# Patient Record
Sex: Male | Born: 1955 | Race: White | Hispanic: No | Smoking: Former smoker
Health system: Southern US, Community
[De-identification: ages and names within clinical notes are randomized; demographics above are authoritative.]

## PROBLEM LIST (undated history)

## (undated) DIAGNOSIS — M6281 Muscle weakness (generalized): Secondary | ICD-10-CM

## (undated) DIAGNOSIS — S069X9A Unspecified intracranial injury with loss of consciousness of unspecified duration, initial encounter: Secondary | ICD-10-CM

## (undated) DIAGNOSIS — G968 Other specified disorders of central nervous system: Secondary | ICD-10-CM

## (undated) DIAGNOSIS — M109 Gout, unspecified: Secondary | ICD-10-CM

## (undated) DIAGNOSIS — S12000A Unspecified displaced fracture of first cervical vertebra, initial encounter for closed fracture: Secondary | ICD-10-CM

## (undated) DIAGNOSIS — H18609 Keratoconus, unspecified, unspecified eye: Secondary | ICD-10-CM

## (undated) DIAGNOSIS — J189 Pneumonia, unspecified organism: Secondary | ICD-10-CM

## (undated) DIAGNOSIS — I10 Essential (primary) hypertension: Secondary | ICD-10-CM

## (undated) DIAGNOSIS — J690 Pneumonitis due to inhalation of food and vomit: Secondary | ICD-10-CM

## (undated) DIAGNOSIS — G959 Disease of spinal cord, unspecified: Principal | ICD-10-CM

## (undated) DIAGNOSIS — R569 Unspecified convulsions: Secondary | ICD-10-CM

## (undated) DIAGNOSIS — I639 Cerebral infarction, unspecified: Secondary | ICD-10-CM

## (undated) DIAGNOSIS — M1990 Unspecified osteoarthritis, unspecified site: Secondary | ICD-10-CM

## (undated) DIAGNOSIS — M545 Low back pain, unspecified: Secondary | ICD-10-CM

## (undated) DIAGNOSIS — F329 Major depressive disorder, single episode, unspecified: Secondary | ICD-10-CM

## (undated) DIAGNOSIS — G231 Progressive supranuclear ophthalmoplegia [Steele-Richardson-Olszewski]: Secondary | ICD-10-CM

## (undated) DIAGNOSIS — G319 Degenerative disease of nervous system, unspecified: Secondary | ICD-10-CM

## (undated) DIAGNOSIS — G629 Polyneuropathy, unspecified: Secondary | ICD-10-CM

## (undated) DIAGNOSIS — E7800 Pure hypercholesterolemia, unspecified: Secondary | ICD-10-CM

## (undated) DIAGNOSIS — R51 Headache: Secondary | ICD-10-CM

## (undated) DIAGNOSIS — M79605 Pain in left leg: Secondary | ICD-10-CM

## (undated) DIAGNOSIS — T1491XA Suicide attempt, initial encounter: Secondary | ICD-10-CM

## (undated) HISTORY — DX: Disease of spinal cord, unspecified: G95.9

## (undated) HISTORY — DX: Gout, unspecified: M10.9

## (undated) HISTORY — DX: Progressive supranuclear ophthalmoplegia (steele-Richardson-olszewski): G23.1

## (undated) HISTORY — DX: Major depressive disorder, single episode, unspecified: F32.9

## (undated) HISTORY — DX: Keratoconus, unspecified, unspecified eye: H18.609

## (undated) HISTORY — PX: ORIF RADIAL HEAD / NECK FRACTURE: SUR956

## (undated) HISTORY — DX: Unspecified osteoarthritis, unspecified site: M19.90

## (undated) HISTORY — PX: FRACTURE SURGERY: SHX138

## (undated) HISTORY — DX: Low back pain, unspecified: M54.50

## (undated) HISTORY — DX: Unspecified displaced fracture of first cervical vertebra, initial encounter for closed fracture: S12.000A

## (undated) HISTORY — PX: SHOULDER ARTHROSCOPY W/ ROTATOR CUFF REPAIR: SHX2400

## (undated) HISTORY — DX: Muscle weakness (generalized): M62.81

## (undated) HISTORY — DX: Low back pain: M54.5

## (undated) HISTORY — DX: Pain in left leg: M79.605

## (undated) HISTORY — DX: Headache: R51

## (undated) HISTORY — DX: Suicide attempt, initial encounter: T14.91XA

---

## 1994-07-03 DIAGNOSIS — S069XAA Unspecified intracranial injury with loss of consciousness status unknown, initial encounter: Secondary | ICD-10-CM

## 1994-07-03 DIAGNOSIS — S069X9A Unspecified intracranial injury with loss of consciousness of unspecified duration, initial encounter: Secondary | ICD-10-CM

## 1994-07-03 HISTORY — DX: Unspecified intracranial injury with loss of consciousness of unspecified duration, initial encounter: S06.9X9A

## 1994-07-03 HISTORY — DX: Unspecified intracranial injury with loss of consciousness status unknown, initial encounter: S06.9XAA

## 2000-05-18 ENCOUNTER — Encounter: Payer: Self-pay | Admitting: Emergency Medicine

## 2000-05-18 ENCOUNTER — Emergency Department (HOSPITAL_COMMUNITY): Admission: EM | Admit: 2000-05-18 | Discharge: 2000-05-18 | Payer: Self-pay | Admitting: Emergency Medicine

## 2000-07-03 HISTORY — PX: CARDIAC CATHETERIZATION: SHX172

## 2000-08-03 ENCOUNTER — Ambulatory Visit (HOSPITAL_COMMUNITY): Admission: RE | Admit: 2000-08-03 | Discharge: 2000-08-03 | Payer: Self-pay | Admitting: Orthopedic Surgery

## 2000-08-03 ENCOUNTER — Encounter: Payer: Self-pay | Admitting: Orthopedic Surgery

## 2000-08-03 ENCOUNTER — Ambulatory Visit (HOSPITAL_COMMUNITY): Admission: RE | Admit: 2000-08-03 | Discharge: 2000-08-06 | Payer: Self-pay | Admitting: Orthopedic Surgery

## 2000-09-01 ENCOUNTER — Encounter: Payer: Self-pay | Admitting: Neurology

## 2000-09-01 ENCOUNTER — Ambulatory Visit (HOSPITAL_COMMUNITY): Admission: RE | Admit: 2000-09-01 | Discharge: 2000-09-01 | Payer: Self-pay | Admitting: Neurology

## 2000-09-24 ENCOUNTER — Encounter: Payer: Self-pay | Admitting: Emergency Medicine

## 2000-09-24 ENCOUNTER — Inpatient Hospital Stay (HOSPITAL_COMMUNITY): Admission: EM | Admit: 2000-09-24 | Discharge: 2000-09-26 | Payer: Self-pay | Admitting: Emergency Medicine

## 2001-07-03 HISTORY — PX: CORNEAL TRANSPLANT: SHX108

## 2002-02-24 ENCOUNTER — Emergency Department (HOSPITAL_COMMUNITY): Admission: EM | Admit: 2002-02-24 | Discharge: 2002-02-24 | Payer: Self-pay | Admitting: Emergency Medicine

## 2002-04-12 ENCOUNTER — Emergency Department (HOSPITAL_COMMUNITY): Admission: EM | Admit: 2002-04-12 | Discharge: 2002-04-12 | Payer: Self-pay | Admitting: *Deleted

## 2002-12-28 ENCOUNTER — Emergency Department (HOSPITAL_COMMUNITY): Admission: EM | Admit: 2002-12-28 | Discharge: 2002-12-28 | Payer: Self-pay | Admitting: Emergency Medicine

## 2002-12-28 ENCOUNTER — Encounter: Payer: Self-pay | Admitting: Emergency Medicine

## 2003-07-23 ENCOUNTER — Emergency Department (HOSPITAL_COMMUNITY): Admission: EM | Admit: 2003-07-23 | Discharge: 2003-07-23 | Payer: Self-pay | Admitting: Emergency Medicine

## 2005-01-06 ENCOUNTER — Encounter: Admission: RE | Admit: 2005-01-06 | Discharge: 2005-01-06 | Payer: Self-pay | Admitting: Orthopedic Surgery

## 2006-07-03 HISTORY — PX: PEG TUBE PLACEMENT: SUR1034

## 2007-07-02 ENCOUNTER — Encounter: Admission: RE | Admit: 2007-07-02 | Discharge: 2007-07-02 | Payer: Self-pay | Admitting: Neurology

## 2010-07-24 ENCOUNTER — Encounter: Payer: Self-pay | Admitting: Neurology
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
## 2010-12-01 ENCOUNTER — Ambulatory Visit (HOSPITAL_COMMUNITY)
Admission: RE | Admit: 2010-12-01 | Discharge: 2010-12-01 | Disposition: A | Discharge status: Home / Self Care | Payer: Medicaid Other | Source: Ambulatory Visit | Attending: Family | Admitting: Family

## 2010-12-01 ENCOUNTER — Other Ambulatory Visit (HOSPITAL_COMMUNITY): Payer: Self-pay | Admitting: Family

## 2010-12-01 DIAGNOSIS — Z931 Gastrostomy status: Secondary | ICD-10-CM

## 2010-12-01 DIAGNOSIS — Z431 Encounter for attention to gastrostomy: Secondary | ICD-10-CM | POA: Insufficient documentation

## 2010-12-01 MED ORDER — IOHEXOL 300 MG/ML  SOLN
50.0000 mL | Freq: Once | INTRAMUSCULAR | Status: AC | PRN
  Administered 2010-12-01: 10 mL via INTRAVENOUS

## 2011-02-05 ENCOUNTER — Emergency Department (HOSPITAL_COMMUNITY): Payer: Medicare Other

## 2011-02-05 ENCOUNTER — Encounter: Payer: Self-pay | Admitting: Internal Medicine

## 2011-02-05 ENCOUNTER — Inpatient Hospital Stay (HOSPITAL_COMMUNITY)
Admission: EM | Admit: 2011-02-05 | Discharge: 2011-02-08 | DRG: 179 | Disposition: A | Discharge status: Home / Self Care | Payer: Medicare Other | Attending: Internal Medicine | Admitting: Internal Medicine

## 2011-02-05 DIAGNOSIS — M109 Gout, unspecified: Secondary | ICD-10-CM | POA: Diagnosis present

## 2011-02-05 DIAGNOSIS — F329 Major depressive disorder, single episode, unspecified: Secondary | ICD-10-CM | POA: Diagnosis present

## 2011-02-05 DIAGNOSIS — G589 Mononeuropathy, unspecified: Secondary | ICD-10-CM | POA: Diagnosis present

## 2011-02-05 DIAGNOSIS — K5900 Constipation, unspecified: Secondary | ICD-10-CM | POA: Diagnosis present

## 2011-02-05 DIAGNOSIS — G40909 Epilepsy, unspecified, not intractable, without status epilepticus: Secondary | ICD-10-CM | POA: Diagnosis present

## 2011-02-05 DIAGNOSIS — Z87820 Personal history of traumatic brain injury: Secondary | ICD-10-CM

## 2011-02-05 DIAGNOSIS — R259 Unspecified abnormal involuntary movements: Secondary | ICD-10-CM | POA: Diagnosis present

## 2011-02-05 DIAGNOSIS — F3289 Other specified depressive episodes: Secondary | ICD-10-CM | POA: Diagnosis present

## 2011-02-05 DIAGNOSIS — J690 Pneumonitis due to inhalation of food and vomit: Principal | ICD-10-CM | POA: Diagnosis present

## 2011-02-05 DIAGNOSIS — E876 Hypokalemia: Secondary | ICD-10-CM | POA: Diagnosis not present

## 2011-02-05 DIAGNOSIS — K219 Gastro-esophageal reflux disease without esophagitis: Secondary | ICD-10-CM | POA: Diagnosis present

## 2011-02-05 LAB — OCCULT BLOOD, POC DEVICE: Fecal Occult Bld: NEGATIVE

## 2011-02-05 LAB — DIFFERENTIAL
Basophils Relative: 0 % (ref 0–1)
Eosinophils Absolute: 0.5 10*3/uL (ref 0.0–0.7)
Monocytes Relative: 9 % (ref 3–12)

## 2011-02-05 LAB — URINALYSIS, ROUTINE W REFLEX MICROSCOPIC
Bilirubin Urine: NEGATIVE
Glucose, UA: NEGATIVE mg/dL
Hgb urine dipstick: NEGATIVE
Ketones, ur: NEGATIVE mg/dL
Leukocytes, UA: NEGATIVE
Nitrite: NEGATIVE
Protein, ur: NEGATIVE mg/dL
Specific Gravity, Urine: 1.025 (ref 1.005–1.030)
Urobilinogen, UA: 1 mg/dL (ref 0.0–1.0)
pH: 7.5 (ref 5.0–8.0)

## 2011-02-05 LAB — POCT I-STAT, CHEM 8
BUN: 16 mg/dL (ref 6–23)
Chloride: 109 mEq/L (ref 96–112)
Creatinine, Ser: 1 mg/dL (ref 0.50–1.35)
Hemoglobin: 15.3 g/dL (ref 13.0–17.0)
Potassium: 3.6 mEq/L (ref 3.5–5.1)
Sodium: 143 mEq/L (ref 135–145)
TCO2: 25 mmol/L (ref 0–100)

## 2011-02-05 LAB — CBC
HCT: 43.4 % (ref 39.0–52.0)
Hemoglobin: 14.2 g/dL (ref 13.0–17.0)
MCH: 31.2 pg (ref 26.0–34.0)
MCHC: 32.7 g/dL (ref 30.0–36.0)
MCV: 95.4 fL (ref 78.0–100.0)
Platelets: 126 K/uL — ABNORMAL LOW (ref 150–400)
RBC: 4.55 MIL/uL (ref 4.22–5.81)
RDW: 12.9 % (ref 11.5–15.5)
WBC: 9.4 K/uL (ref 4.0–10.5)

## 2011-02-05 LAB — COMPREHENSIVE METABOLIC PANEL
Alkaline Phosphatase: 88 U/L (ref 39–117)
CO2: 27 mEq/L (ref 19–32)
GFR calc non Af Amer: >60 mL/min (ref >60)
Glucose, Bld: 94 mg/dL (ref 70–99)
Potassium: 3.5 mEq/L (ref 3.5–5.1)
Sodium: 141 mEq/L (ref 135–145)
Total Protein: 7.2 g/dL (ref 6.0–8.3)

## 2011-02-05 LAB — CK TOTAL AND CKMB (NOT AT ARMC)
CK, MB: 4.4 ng/mL — ABNORMAL HIGH (ref 0.3–4.0)
Total CK: 118 U/L (ref 7–232)

## 2011-02-05 LAB — TROPONIN I: Troponin I: <0.3 ng/mL (ref <0.30)

## 2011-02-05 MED ORDER — IOHEXOL 300 MG/ML  SOLN
100.0000 mL | Freq: Once | INTRAMUSCULAR | Status: AC | PRN
  Administered 2011-02-05: 100 mL via INTRAVENOUS
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
## 2011-02-06 DIAGNOSIS — M545 Low back pain: Secondary | ICD-10-CM

## 2011-02-06 DIAGNOSIS — J690 Pneumonitis due to inhalation of food and vomit: Secondary | ICD-10-CM

## 2011-02-06 LAB — LIPID PANEL
Cholesterol: 93 mg/dL (ref 0–200)
Total CHOL/HDL Ratio: 3.4 RATIO
Triglycerides: 87 mg/dL (ref <150)

## 2011-02-06 LAB — EXPECTORATED SPUTUM ASSESSMENT W GRAM STAIN, RFLX TO RESP C

## 2011-02-06 LAB — CARDIAC PANEL(CRET KIN+CKTOT+MB+TROPI)
CK, MB: 3.2 ng/mL (ref 0.3–4.0)
Relative Index: INVALID (ref 0.0–2.5)

## 2011-02-06 LAB — BASIC METABOLIC PANEL
BUN: 13 mg/dL (ref 6–23)
Calcium: 8.2 mg/dL — ABNORMAL LOW (ref 8.4–10.5)
GFR calc Af Amer: >60 mL/min (ref >60)
Glucose, Bld: 92 mg/dL (ref 70–99)
Sodium: 141 mEq/L (ref 135–145)

## 2011-02-06 LAB — URINE CULTURE
Colony Count: NO GROWTH
Culture  Setup Time: 201208051753

## 2011-02-06 LAB — CBC
HCT: 39.4 % (ref 39.0–52.0)
Hemoglobin: 12.8 g/dL — ABNORMAL LOW (ref 13.0–17.0)

## 2011-02-07 LAB — CBC
HCT: 41.9 % (ref 39.0–52.0)
Hemoglobin: 13.7 g/dL (ref 13.0–17.0)
MCV: 95 fL (ref 78.0–100.0)
RBC: 4.41 MIL/uL (ref 4.22–5.81)
WBC: 7.7 10*3/uL (ref 4.0–10.5)

## 2011-02-07 LAB — BASIC METABOLIC PANEL
BUN: 10 mg/dL (ref 6–23)
Chloride: 109 mEq/L (ref 96–112)
GFR calc Af Amer: >60 mL/min (ref >60)
Glucose, Bld: 99 mg/dL (ref 70–99)
Potassium: 3.9 mEq/L (ref 3.5–5.1)
Sodium: 143 mEq/L (ref 135–145)

## 2011-02-12 LAB — CULTURE, BLOOD (ROUTINE X 2)
Culture  Setup Time: 201208060009
Culture  Setup Time: 201208060009
Culture: NO GROWTH
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
## 2011-03-15 ENCOUNTER — Other Ambulatory Visit: Payer: Self-pay | Admitting: Neurology

## 2011-03-15 DIAGNOSIS — R200 Anesthesia of skin: Secondary | ICD-10-CM

## 2011-03-17 ENCOUNTER — Ambulatory Visit
Admission: RE | Admit: 2011-03-17 | Discharge: 2011-03-17 | Disposition: A | Discharge status: Home / Self Care | Payer: Medicare Other | Source: Ambulatory Visit | Attending: Neurology | Admitting: Neurology

## 2011-03-17 DIAGNOSIS — R200 Anesthesia of skin: Secondary | ICD-10-CM

## 2011-05-26 ENCOUNTER — Encounter (HOSPITAL_COMMUNITY): Payer: Self-pay | Admitting: *Deleted

## 2011-05-26 ENCOUNTER — Emergency Department (HOSPITAL_COMMUNITY)
Admission: EM | Admit: 2011-05-26 | Discharge: 2011-05-26 | Disposition: A | Discharge status: Home / Self Care | Payer: Medicare Other | Attending: Emergency Medicine | Admitting: Emergency Medicine

## 2011-05-26 ENCOUNTER — Emergency Department (HOSPITAL_COMMUNITY): Payer: Medicare Other

## 2011-05-26 DIAGNOSIS — Z79899 Other long term (current) drug therapy: Secondary | ICD-10-CM | POA: Insufficient documentation

## 2011-05-26 DIAGNOSIS — Z931 Gastrostomy status: Secondary | ICD-10-CM

## 2011-05-26 DIAGNOSIS — K9423 Gastrostomy malfunction: Secondary | ICD-10-CM | POA: Insufficient documentation

## 2011-05-26 MED ORDER — FENTANYL CITRATE 0.05 MG/ML IJ SOLN
50.0000 ug | Freq: Once | INTRAMUSCULAR | Status: AC
  Administered 2011-05-26: 50 ug via INTRAMUSCULAR
  Filled 2011-05-26: qty 2

## 2011-05-26 NOTE — ED Notes (Signed)
Pt recently was admitted to [facility] d/t recent fall and neck fx-C-collar in place.

## 2011-05-26 NOTE — ED Provider Notes (Signed)
History     CSN: [id number] Arrival date & time: 05/26/2011  5:36 PM   First MD Initiated Contact with Patient 05/26/11 1904      Chief Complaint  Patient presents with  . GI Problem    feeding tube fell out    (Consider location/radiation/quality/duration/timing/severity/associated sxs/prior treatment) HPI Pt's family reports pt's G-tube "fell out" today-pt denies any abd pain. G-tube was placed x 6 months ago. Family reports noticing blood from the insertion site-dark blood noted.   History reviewed. No pertinent past medical history.  Past Surgical History  Procedure Date  . Fracture surgery     No family history on file.  History  Substance Use Topics  . Smoking status: Not on file  . Smokeless tobacco: Not on file  . Alcohol Use: No      Review of Systems  Allergies  Review of patient's allergies indicates no known allergies.  Home Medications   Current Outpatient Rx  Name Route Sig Dispense Refill  . ALBUTEROL SULFATE (2.5 MG/3ML) 0.083% IN NEBU Nebulization Take 2.5 mg by nebulization 2 (two) times daily.      . ALLOPURINOL 300 MG PO TABS Oral Take 300 mg by mouth daily. Via tube     . CARBIDOPA-LEVODOPA 25-250 MG PO TABS Oral Take 1 tablet by mouth 4 (four) times daily. Via tube     . CITALOPRAM HYDROBROMIDE 40 MG PO TABS Oral Take 40 mg by mouth daily. Via tube     . ESOMEPRAZOLE MAGNESIUM 40 MG PO CPDR Oral Take 40 mg by mouth daily before breakfast. Via tube     . GABAPENTIN 300 MG PO CAPS Oral Take 1,500 mg by mouth daily. Via tube     . HYDROCODONE-ACETAMINOPHEN 7.5-500 MG PO TABS Oral Take 1 tablet by mouth every 6 (six) hours as needed. Via tube     . LACOSAMIDE 200 MG PO TABS Oral Take 100 mg by mouth 2 (two) times daily. Viva tube     . TOPIRAMATE 100 MG PO TABS Oral Take 250 mg by mouth 2 (two) times daily. One and one-half tab via tube every morning and one tab every evening     . TRAZODONE HCL 50 MG PO TABS Oral Take 50 mg by mouth at  bedtime. Via tube       BP 114/78  Pulse 78  Temp(Src) 98.8 F (37.1 C) (Oral)  Resp 20  SpO2 100%  Physical Exam  Nursing note and vitals reviewed. Constitutional: He appears well-developed. No distress.  HENT:  Head: Normocephalic and atraumatic.  Eyes: Pupils are equal, round, and reactive to light.  Neck:       C-collar in place  Cardiovascular: Regular rhythm, normal heart sounds and intact distal pulses.   Pulmonary/Chest: Effort normal. No respiratory distress.  Abdominal: Soft. Normal appearance. He exhibits no distension. There is no tenderness. There is no rigidity, no rebound and no guarding. No hernia.    Neurological: He is alert. No cranial nerve deficit.  Skin: Skin is warm and dry. No rash noted.  Psychiatric: He has a normal mood and affect. His behavior is normal.    ED Course  Procedures (including critical care time) Gastric tube replaced.  Xray confirmation of proper placement.  Pt discharged with instructions to follow up with their doctor next week.   Labs Reviewed - No data to display No results found.   Dx: G-tube Replacement    MDM  Nelia Shi, MD 05/27/11 [phone]

## 2011-05-26 NOTE — ED Notes (Signed)
Family states pt was d/c from [facility] today after breaking his neck.  Pt has muscular disease and gets all feedings through g tube.  When pt got home, his feeding tube "broke off".  "there is part of the tube still inside".  Pt gets all meds through tube.

## 2011-05-26 NOTE — ED Notes (Signed)
Pt awaiting replacement g-tube

## 2011-05-26 NOTE — ED Notes (Signed)
Pt's family reports pt's G-tube "fell out" today-pt denies any abd pain.  G-tube was placed x 6 months ago.  Family reports noticing blood from the insertion site-dark blood noted.

## 2011-05-27 ENCOUNTER — Encounter (HOSPITAL_COMMUNITY): Payer: Self-pay | Admitting: *Deleted

## 2011-05-27 ENCOUNTER — Emergency Department (HOSPITAL_COMMUNITY)
Admission: EM | Admit: 2011-05-27 | Discharge: 2011-05-27 | Disposition: A | Discharge status: Home / Self Care | Payer: Medicare Other | Attending: Emergency Medicine | Admitting: Emergency Medicine

## 2011-05-27 ENCOUNTER — Emergency Department (HOSPITAL_COMMUNITY): Payer: Medicare Other

## 2011-05-27 DIAGNOSIS — Z431 Encounter for attention to gastrostomy: Secondary | ICD-10-CM | POA: Insufficient documentation

## 2011-05-27 DIAGNOSIS — R109 Unspecified abdominal pain: Secondary | ICD-10-CM | POA: Insufficient documentation

## 2011-05-27 MED ORDER — HYDROMORPHONE HCL PF 2 MG/ML IJ SOLN
INTRAMUSCULAR | Status: AC
  Administered 2011-05-27: 2 mg
  Filled 2011-05-27: qty 1

## 2011-05-27 MED ORDER — HYDROMORPHONE HCL PF 1 MG/ML IJ SOLN: 1.0000 mg | Freq: Once | INTRAMUSCULAR | Status: DC

## 2011-05-27 NOTE — ED Notes (Signed)
Pt assisted to wheel chair with tech. Mother acknowledged understanding of instructions.

## 2011-05-27 NOTE — ED Notes (Signed)
Pt with some discomfort due to attempts to reinsert feeding tube.

## 2011-05-27 NOTE — ED Provider Notes (Signed)
History     CSN: [id number] Arrival date & time: 05/27/2011 10:14 AM   First MD Initiated Contact with Patient 05/27/11 1035      Chief Complaint  Patient presents with  . Abdominal Pain    no pain, feeding tube come out    (Consider location/radiation/quality/duration/timing/severity/associated sxs/prior treatment) Patient is a 55 y.o. male presenting with abdominal pain. The history is provided by the patient.  Abdominal Pain The primary symptoms of the illness include abdominal pain. Primary symptoms comment: The patient returns for recurrence of PEG tube displacing. No complaint of pain. The current episode started 3 to 5 hours ago. The onset of the illness was sudden.    History reviewed. No pertinent past medical history.  Past Surgical History  Procedure Date  . Fracture surgery     No family history on file.  History  Substance Use Topics  . Smoking status: Not on file  . Smokeless tobacco: Not on file  . Alcohol Use: No      Review of Systems  Constitutional: Negative.   Respiratory: Negative.   Cardiovascular: Negative.   Gastrointestinal: Positive for abdominal pain.       See HPI.    Allergies  Review of patient's allergies indicates no known allergies.  Home Medications   Current Outpatient Rx  Name Route Sig Dispense Refill  . ALBUTEROL SULFATE (2.5 MG/3ML) 0.083% IN NEBU Nebulization Take 2.5 mg by nebulization 2 (two) times daily.      . ALLOPURINOL 300 MG PO TABS Oral Take 300 mg by mouth daily. Via tube     . CARBIDOPA-LEVODOPA 25-250 MG PO TABS Oral Take 1 tablet by mouth 4 (four) times daily. Via tube     . CITALOPRAM HYDROBROMIDE 40 MG PO TABS Oral Take 40 mg by mouth daily. Via tube     . ESOMEPRAZOLE MAGNESIUM 40 MG PO CPDR Oral Take 40 mg by mouth daily before breakfast. Via tube     . GABAPENTIN 300 MG PO CAPS Oral Take 1,500 mg by mouth daily. Via tube     . HYDROCODONE-ACETAMINOPHEN 7.5-500 MG PO TABS Oral Take 1 tablet by mouth  every 6 (six) hours as needed. Via tube     . LACOSAMIDE 200 MG PO TABS Oral Take 100 mg by mouth 2 (two) times daily. Viva tube     . TOPIRAMATE 100 MG PO TABS Oral Take 250 mg by mouth 2 (two) times daily. One and one-half tab via tube every morning and one tab every evening     . TRAZODONE HCL 50 MG PO TABS Oral Take 50 mg by mouth at bedtime. Via tube       BP 119/77  Pulse 81  Temp(Src) 98.4 F (36.9 C) (Oral)  Resp 16  SpO2 98%  Physical Exam  Constitutional: He appears well-developed and well-nourished.  HENT:  Mouth/Throat: Oropharynx is clear and moist.  Neck: Normal range of motion.  Cardiovascular: Normal rate and regular rhythm.   Pulmonary/Chest: Effort normal and breath sounds normal.  Abdominal: Bowel sounds are normal. He exhibits no mass. There is no tenderness.       PEG tube stoma unremarkable, without bleeding. Nontender to immediate area nor to abdomen generally.  Musculoskeletal: Normal range of motion.  Neurological: He is alert.  Skin: Skin is warm and dry.  Psychiatric: He has a normal mood and affect.    ED Course  Procedures (including critical care time)  Labs Reviewed - No data to display  Dg Abd 1 View  05/26/2011  *RADIOLOGY REPORT*  Clinical Data: G-tube placement; confirm location.  ABDOMEN - 1 VIEW  Comparison: Abdominal radiograph and CT of the abdomen and pelvis performed 02/05/2011  Findings: Injection of 30 mL of Omnipaque contrast into the patient's G-tube demonstrates filling of the stomach and duodenum; the G-tube appears to be at the antrum of the stomach.  The visualized bowel gas pattern is unremarkable.  No free intra- abdominal air is identified, though evaluation for free air is suboptimal on a single supine view.  The visualized lung bases are grossly clear.  No acute osseous abnormalities are identified.  IMPRESSION: G-tube noted at the antrum of the stomach; injected contrast passes into the duodenum and proximal jejunum.  Original  Report Authenticated By: Tonia Ghent, M.D.     No diagnosis found.    MDM  Patient's original PEG was a 22Fr, which was unable to be re-inserted. 20Fr successfully inserted with minimal discomfort. Post-reinsertion film shows proper placement.        Rodena Medin, PA 05/27/11 1410  Rodena Medin, PA 05/27/11 1410

## 2011-05-27 NOTE — ED Notes (Signed)
Pt here last night for feeding tube reinsertion, tube has come out again this morning

## 2011-05-27 NOTE — ED Provider Notes (Signed)
Medical screening examination/treatment/procedure(s) were performed by non-physician practitioner and as supervising physician I was immediately available for consultation/collaboration.   Lauryn Lizardi, MD 05/27/11 1537 

## 2011-05-27 NOTE — ED Notes (Signed)
New Feeding tube at bedside per MD request.

## 2011-06-23 ENCOUNTER — Emergency Department (HOSPITAL_COMMUNITY): Payer: Medicare Other

## 2011-06-23 ENCOUNTER — Encounter (HOSPITAL_COMMUNITY): Payer: Self-pay | Admitting: Emergency Medicine

## 2011-06-23 ENCOUNTER — Emergency Department (HOSPITAL_COMMUNITY)
Admission: EM | Admit: 2011-06-23 | Discharge: 2011-06-23 | Disposition: A | Discharge status: Other Acute Inpatient Hospital | Payer: Medicare Other | Attending: Emergency Medicine | Admitting: Emergency Medicine

## 2011-06-23 DIAGNOSIS — H11419 Vascular abnormalities of conjunctiva, unspecified eye: Secondary | ICD-10-CM | POA: Insufficient documentation

## 2011-06-23 DIAGNOSIS — E119 Type 2 diabetes mellitus without complications: Secondary | ICD-10-CM | POA: Insufficient documentation

## 2011-06-23 DIAGNOSIS — R202 Paresthesia of skin: Secondary | ICD-10-CM

## 2011-06-23 DIAGNOSIS — T819XXA Unspecified complication of procedure, initial encounter: Secondary | ICD-10-CM

## 2011-06-23 DIAGNOSIS — R200 Anesthesia of skin: Secondary | ICD-10-CM

## 2011-06-23 DIAGNOSIS — Y831 Surgical operation with implant of artificial internal device as the cause of abnormal reaction of the patient, or of later complication, without mention of misadventure at the time of the procedure: Secondary | ICD-10-CM | POA: Insufficient documentation

## 2011-06-23 DIAGNOSIS — Z79899 Other long term (current) drug therapy: Secondary | ICD-10-CM | POA: Insufficient documentation

## 2011-06-23 DIAGNOSIS — W050XXA Fall from non-moving wheelchair, initial encounter: Secondary | ICD-10-CM

## 2011-06-23 DIAGNOSIS — IMO0002 Reserved for concepts with insufficient information to code with codable children: Secondary | ICD-10-CM | POA: Insufficient documentation

## 2011-06-23 DIAGNOSIS — E7800 Pure hypercholesterolemia, unspecified: Secondary | ICD-10-CM | POA: Insufficient documentation

## 2011-06-23 DIAGNOSIS — R569 Unspecified convulsions: Secondary | ICD-10-CM | POA: Insufficient documentation

## 2011-06-23 DIAGNOSIS — R209 Unspecified disturbances of skin sensation: Secondary | ICD-10-CM | POA: Insufficient documentation

## 2011-06-23 DIAGNOSIS — G9589 Other specified diseases of spinal cord: Secondary | ICD-10-CM | POA: Insufficient documentation

## 2011-06-23 DIAGNOSIS — M549 Dorsalgia, unspecified: Secondary | ICD-10-CM | POA: Insufficient documentation

## 2011-06-23 DIAGNOSIS — I10 Essential (primary) hypertension: Secondary | ICD-10-CM | POA: Insufficient documentation

## 2011-06-23 DIAGNOSIS — M542 Cervicalgia: Secondary | ICD-10-CM | POA: Insufficient documentation

## 2011-06-23 HISTORY — DX: Pure hypercholesterolemia, unspecified: E78.00

## 2011-06-23 HISTORY — DX: Degenerative disease of nervous system, unspecified: G31.9

## 2011-06-23 HISTORY — DX: Unspecified convulsions: R56.9

## 2011-06-23 HISTORY — DX: Essential (primary) hypertension: I10

## 2011-06-23 HISTORY — DX: Other specified disorders of central nervous system: G96.8

## 2011-06-23 MED ORDER — FENTANYL CITRATE 0.05 MG/ML IJ SOLN
25.0000 ug | Freq: Once | INTRAMUSCULAR | Status: AC
  Administered 2011-06-23: 25 ug via INTRAVENOUS
  Filled 2011-06-23: qty 2

## 2011-06-23 MED ORDER — FENTANYL CITRATE 0.05 MG/ML IJ SOLN
50.0000 ug | Freq: Once | INTRAMUSCULAR | Status: AC
  Administered 2011-06-23: 50 ug via INTRAVENOUS
  Filled 2011-06-23: qty 2
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
## 2011-06-23 NOTE — ED Notes (Signed)
Pt returned from CT. Assisted pt with urinal. of dark yellow urine noted. Pt in no distress at this time. AWaiting ct results

## 2011-06-23 NOTE — ED Notes (Signed)
Called resp to assess pt

## 2011-06-23 NOTE — ED Notes (Signed)
No change in pt condition. Resting quietly in bed. Mother at bedside. No distress noted at this time.

## 2011-06-23 NOTE — ED Notes (Signed)
Pt from home c/o falling out of wheel chair this am; pt was able to get back into chair and notify family; pt with hx of C2 fracture 1 month ago that was repaired at [facility]; pt sts was wearing brace when fell; pt c/o bilateral numbness and tingling worse in arms but in all extremities; pt mae; pt with hx of degenerative brain stem disease and can not speak but pt is alert and oriented

## 2011-06-23 NOTE — ED Notes (Signed)
Patients low blood pressure reported to rn/jessica.

## 2011-10-20 ENCOUNTER — Other Ambulatory Visit: Payer: Self-pay | Admitting: Neurology

## 2011-10-20 DIAGNOSIS — M5412 Radiculopathy, cervical region: Secondary | ICD-10-CM

## 2011-10-26 ENCOUNTER — Ambulatory Visit
Admission: RE | Admit: 2011-10-26 | Discharge: 2011-10-26 | Disposition: A | Discharge status: Home / Self Care | Payer: Medicare Other | Source: Ambulatory Visit | Attending: Neurology | Admitting: Neurology

## 2011-10-26 DIAGNOSIS — M5412 Radiculopathy, cervical region: Secondary | ICD-10-CM

## 2012-01-09 ENCOUNTER — Other Ambulatory Visit: Payer: Self-pay | Admitting: Neurosurgery

## 2012-01-09 DIAGNOSIS — R202 Paresthesia of skin: Secondary | ICD-10-CM

## 2012-01-15 ENCOUNTER — Ambulatory Visit
Admission: RE | Admit: 2012-01-15 | Discharge: 2012-01-15 | Disposition: A | Discharge status: Home / Self Care | Payer: Medicare Other | Source: Ambulatory Visit | Attending: Neurosurgery | Admitting: Neurosurgery

## 2012-01-15 DIAGNOSIS — R202 Paresthesia of skin: Secondary | ICD-10-CM

## 2012-01-22 ENCOUNTER — Other Ambulatory Visit: Payer: Self-pay | Admitting: Neurology

## 2012-01-22 DIAGNOSIS — R1310 Dysphagia, unspecified: Secondary | ICD-10-CM

## 2012-01-23 ENCOUNTER — Emergency Department (HOSPITAL_COMMUNITY)
Admission: EM | Admit: 2012-01-23 | Discharge: 2012-01-23 | Disposition: A | Discharge status: Home / Self Care | Payer: Medicare Other | Attending: Emergency Medicine | Admitting: Emergency Medicine

## 2012-01-23 ENCOUNTER — Emergency Department (HOSPITAL_COMMUNITY): Payer: Medicare Other

## 2012-01-23 ENCOUNTER — Encounter (HOSPITAL_COMMUNITY): Payer: Self-pay | Admitting: *Deleted

## 2012-01-23 ENCOUNTER — Other Ambulatory Visit: Payer: Self-pay | Admitting: Neurology

## 2012-01-23 ENCOUNTER — Encounter: Payer: Self-pay | Admitting: Neurology

## 2012-01-23 DIAGNOSIS — E119 Type 2 diabetes mellitus without complications: Secondary | ICD-10-CM | POA: Insufficient documentation

## 2012-01-23 DIAGNOSIS — M109 Gout, unspecified: Secondary | ICD-10-CM | POA: Insufficient documentation

## 2012-01-23 DIAGNOSIS — T85528A Displacement of other gastrointestinal prosthetic devices, implants and grafts, initial encounter: Secondary | ICD-10-CM

## 2012-01-23 DIAGNOSIS — M129 Arthropathy, unspecified: Secondary | ICD-10-CM | POA: Insufficient documentation

## 2012-01-23 DIAGNOSIS — I10 Essential (primary) hypertension: Secondary | ICD-10-CM | POA: Insufficient documentation

## 2012-01-23 DIAGNOSIS — Z87891 Personal history of nicotine dependence: Secondary | ICD-10-CM | POA: Insufficient documentation

## 2012-01-23 DIAGNOSIS — K9423 Gastrostomy malfunction: Secondary | ICD-10-CM

## 2012-01-23 DIAGNOSIS — K9420 Gastrostomy complication, unspecified: Secondary | ICD-10-CM | POA: Insufficient documentation

## 2012-01-23 DIAGNOSIS — E7800 Pure hypercholesterolemia, unspecified: Secondary | ICD-10-CM | POA: Insufficient documentation

## 2012-01-23 HISTORY — DX: Progressive supranuclear ophthalmoplegia (steele-Richardson-olszewski): G23.1

## 2012-01-23 MED ORDER — IOHEXOL 300 MG/ML  SOLN
50.0000 mL | Freq: Once | INTRAMUSCULAR | Status: AC | PRN
  Administered 2012-01-23: 50 mL

## 2012-01-23 NOTE — Progress Notes (Signed)
pcp is Fayrene Fearing love per pt Updated EPIC

## 2012-01-23 NOTE — ED Notes (Signed)
Peg site cleaned w/ NS and drsg applied, PEG tube placed at bedside.

## 2012-01-23 NOTE — ED Notes (Signed)
ZOX:[id number]<AV> Expected date:01/23/12<BR> Expected time: 2:44 PM<BR> Means of arrival:Ambulance<BR> Comments:<BR> Peg tube came out

## 2012-01-23 NOTE — ED Provider Notes (Signed)
History    56yM presenting after after PEG tube came out. Has at bedside. Balloon broken. Happened about 3 hours prior to arrival. Mild abdominal discomfort. No n/v. No CP. No other complaints.   CSN: [id number]  Arrival date & time 01/23/12  1453   First MD Initiated Contact with Patient 01/23/12 1513      Chief Complaint  Patient presents with  . Abdominal Pain    PEG tube came out    (Consider location/radiation/quality/duration/timing/severity/associated sxs/prior treatment) HPI  Past Medical History  Diagnosis Date  . Diabetes mellitus   . Hypertension   . Seizure   . Hypercholesterolemia   . CNS degenerative disease   . Headache   . Arthritis   . Gout   . Low back pain radiating to left leg   . Keratoconus   . Supranuclear palsy     Past Surgical History  Procedure Date  . Fracture surgery   . Peg tube placement 2008  . Left shoulder surgery 2002  . Corneal implant 2003    History reviewed. No pertinent family history.  History  Substance Use Topics  . Smoking status: Former Games developer  . Smokeless tobacco: Not on file  . Alcohol Use: No      Review of Systems   Review of symptoms negative unless otherwise noted in HPI.   Allergies  Review of patient's allergies indicates no known allergies.  Home Medications   Current Outpatient Rx  Name Route Sig Dispense Refill  . ALBUTEROL SULFATE (2.5 MG/3ML) 0.083% IN NEBU Nebulization Take 2.5 mg by nebulization 2 (two) times daily.     . ALLOPURINOL 300 MG PO TABS Tube Give 300 mg by tube daily. Via tube    . CARBIDOPA-LEVODOPA 25-250 MG PO TABS Tube Give 1 tablet by tube 4 (four) times daily. Via tube    . VITAMIN D 1000 UNITS PO TABS Tube Give 1,000 Units by tube 2 (two) times daily.     Marland Kitchen CITALOPRAM HYDROBROMIDE 40 MG PO TABS Tube Give 40 mg by tube daily. Via tube    . ESOMEPRAZOLE MAGNESIUM 40 MG PO CPDR Tube Give 40 mg by tube daily before breakfast. Via tube    . GABAPENTIN 300 MG PO CAPS Tube  Give 1,500 mg by tube daily. Via tube    . HYDROCODONE-ACETAMINOPHEN 7.5-500 MG PO TABS Tube Give 1 tablet by tube every 6 (six) hours as needed. For pain    . LACOSAMIDE 200 MG PO TABS Tube Give 100 mg by tube 2 (two) times daily. Viva tube    . TOPIRAMATE 100 MG PO TABS Tube Give 250 mg by tube 2 (two) times daily. One and one-half tab via tube every morning and one tab every evening    . TRAZODONE HCL 50 MG PO TABS Tube Give 50 mg by tube at bedtime. Via tube      BP 106/72  Pulse 59  Temp 98.1 F (36.7 C) (Oral)  Resp 14  SpO2 100%  Physical Exam  Nursing note and vitals reviewed. Constitutional: No distress.  HENT:  Head: Atraumatic.  Eyes: Conjunctivae are normal. Right eye exhibits no discharge. Left eye exhibits no discharge.  Neck: Neck supple.  Cardiovascular: Normal rate, regular rhythm and normal heart sounds.  Exam reveals no gallop and no friction rub.   No murmur heard. Pulmonary/Chest: Effort normal and breath sounds normal. No respiratory distress.  Abdominal: Soft. He exhibits no distension. There is no tenderness.  Gastrostomy site LUQ. Healthy appearing granulation tissue around tract. No bleeding.  Musculoskeletal: He exhibits no edema and no tenderness.  Neurological: He is alert.  Skin: Skin is warm and dry.    ED Course  Gastrostomy tube replacement Date/Time: 01/23/2012 4:06 PM Performed by: Raeford Razor Authorized by: Raeford Razor Consent: Verbal consent obtained. Consent given by: patient Patient identity confirmed: verbally with patient and arm band Preparation: Patient was prepped and draped in the usual sterile fashion. Patient sedated: no Patient tolerance: Patient tolerated the procedure well with no immediate complications. Comments: 39F gastrostomy tube placed. Placement confirmed with with XR.   (including critical care time)  Labs Reviewed - No data to display Dg Abd 1 View  01/23/2012  *RADIOLOGY REPORT*  Clinical Data: Peg  tube placement.  ABDOMEN - 1 VIEW  Comparison: 01/19/2012.  Findings:  50 ml of Omnipaque-300 was instilled by the staff.   Peg tube tip is in the gastric body.  No obvious leakage of contrast.  Radiopaque content of the descending colon/sigmoid colon may be related to ingested material.  The possibility of free intraperitoneal air cannot be addressed on a supine view.  IMPRESSION: Peg tube tip is in the gastric body.  Original Report Authenticated By: Fuller Canada, M.D.     1. Dislodged gastrostomy tube   2. Gastrostomy tube dysfunction       MDM  56yM with dislodged gastrostomy tube. Replaced without difficulty. XR reviewed and appears in place. FU with interventional radiology as previously scheduled.        Raeford Razor, MD 01/23/12 1640

## 2012-01-23 NOTE — ED Notes (Signed)
New dressing applied to PEG tube opening.
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
## 2012-01-23 NOTE — ED Notes (Signed)
Per EMS pt from home, PEG tube came out, scheduled tomorrow for permanent tube placement, drainage coming out from where PEG tube was, complaining of abdominal pain 6/10. Denies n/v.

## 2012-01-23 NOTE — ED Notes (Signed)
Pt states he lives at home with a roommate who helps take care of him, states was getting his medication this morning and his PEG tube fell out, dressing over PEG tube site, yellow drainage visible. Pt states having abdominal pain 6/10. Denies n/v/d. Pt states had to get the PEG tube for medications and nutrition. States is suppose to get a permanent one tomorrow.

## 2012-01-24 ENCOUNTER — Ambulatory Visit (HOSPITAL_COMMUNITY)
Admission: RE | Admit: 2012-01-24 | Discharge: 2012-01-24 | Disposition: A | Discharge status: Home / Self Care | Payer: Medicare Other | Source: Ambulatory Visit | Attending: Neurology | Admitting: Neurology

## 2012-01-24 DIAGNOSIS — R1310 Dysphagia, unspecified: Secondary | ICD-10-CM

## 2012-08-03 HISTORY — PX: CHOLECYSTECTOMY: SHX55

## 2012-09-30 ENCOUNTER — Emergency Department (HOSPITAL_COMMUNITY)
Admission: EM | Admit: 2012-09-30 | Discharge: 2012-10-01 | Disposition: A | Discharge status: Home / Self Care | Payer: Medicare Other | Attending: Emergency Medicine | Admitting: Emergency Medicine

## 2012-09-30 ENCOUNTER — Encounter (HOSPITAL_COMMUNITY): Payer: Self-pay | Admitting: Emergency Medicine

## 2012-09-30 DIAGNOSIS — R5383 Other fatigue: Secondary | ICD-10-CM | POA: Insufficient documentation

## 2012-09-30 DIAGNOSIS — R531 Weakness: Secondary | ICD-10-CM

## 2012-09-30 DIAGNOSIS — G40909 Epilepsy, unspecified, not intractable, without status epilepticus: Secondary | ICD-10-CM | POA: Insufficient documentation

## 2012-09-30 DIAGNOSIS — Z87891 Personal history of nicotine dependence: Secondary | ICD-10-CM | POA: Insufficient documentation

## 2012-09-30 DIAGNOSIS — R5381 Other malaise: Secondary | ICD-10-CM | POA: Insufficient documentation

## 2012-09-30 DIAGNOSIS — I10 Essential (primary) hypertension: Secondary | ICD-10-CM | POA: Insufficient documentation

## 2012-09-30 DIAGNOSIS — E7800 Pure hypercholesterolemia, unspecified: Secondary | ICD-10-CM | POA: Insufficient documentation

## 2012-09-30 DIAGNOSIS — Z8669 Personal history of other diseases of the nervous system and sense organs: Secondary | ICD-10-CM | POA: Insufficient documentation

## 2012-09-30 DIAGNOSIS — Z8739 Personal history of other diseases of the musculoskeletal system and connective tissue: Secondary | ICD-10-CM | POA: Insufficient documentation

## 2012-09-30 DIAGNOSIS — E119 Type 2 diabetes mellitus without complications: Secondary | ICD-10-CM | POA: Insufficient documentation

## 2012-09-30 DIAGNOSIS — Z79899 Other long term (current) drug therapy: Secondary | ICD-10-CM | POA: Insufficient documentation

## 2012-09-30 DIAGNOSIS — M109 Gout, unspecified: Secondary | ICD-10-CM | POA: Insufficient documentation

## 2012-09-30 NOTE — ED Notes (Signed)
WUJ:[id number]<JY> Expected date:09/30/12<BR> Expected time:10:51 PM<BR> Means of arrival:Ambulance<BR> Comments:<BR> 57 yo M

## 2012-09-30 NOTE — ED Notes (Signed)
Pt is aware of the need for urine sample. States he will let me know when he can void.

## 2012-09-30 NOTE — ED Notes (Signed)
Per EMS, pt has been more weak than normal, pt usually able to stand to transfer to chair, unable to at this time.  Pt began shaking and almost fell upon trying to stand.  Pt nonverbal but able to communicate that his head hurts and has been hurting for the past two days.

## 2012-10-01 ENCOUNTER — Emergency Department (HOSPITAL_COMMUNITY): Payer: Medicare Other

## 2012-10-01 LAB — CBC
Hemoglobin: 14.9 g/dL (ref 13.0–17.0)
MCH: 30.2 pg (ref 26.0–34.0)
MCV: 94.1 fL (ref 78.0–100.0)
Platelets: 146 10*3/uL — ABNORMAL LOW (ref 150–400)
RBC: 4.94 MIL/uL (ref 4.22–5.81)

## 2012-10-01 LAB — URINALYSIS, ROUTINE W REFLEX MICROSCOPIC
Bilirubin Urine: NEGATIVE
Hgb urine dipstick: NEGATIVE
Ketones, ur: NEGATIVE mg/dL
Specific Gravity, Urine: 1.016 (ref 1.005–1.030)
Urobilinogen, UA: 0.2 mg/dL (ref 0.0–1.0)

## 2012-10-01 LAB — BASIC METABOLIC PANEL
CO2: 28 mEq/L (ref 19–32)
Calcium: 9.4 mg/dL (ref 8.4–10.5)
Creatinine, Ser: 0.47 mg/dL — ABNORMAL LOW (ref 0.50–1.35)
Glucose, Bld: 78 mg/dL (ref 70–99)

## 2012-10-01 MED ORDER — HYDROCODONE-ACETAMINOPHEN 7.5-325 MG/15ML PO SOLN
15.0000 mL | Freq: Once | ORAL | Status: AC
  Administered 2012-10-01: 15 mL
  Filled 2012-10-01: qty 15

## 2012-10-01 NOTE — ED Provider Notes (Signed)
History     CSN: [id number]  Arrival date & time 09/30/12  2314   First MD Initiated Contact with Patient 09/30/12 2335      Chief Complaint  Patient presents with  . Weakness     HPI The patient is a 57 year old patient with a history of super nuclear palsy and chronic dysphasia for which is fed through a G-tube constantly.  Caregiver believes that the patient is slightly more weak today than baseline and nearly fell when trying to get up and move.  No nausea vomiting or diarrhea.  No fevers or chills.  No recent shortness of breath but does report cough.  The patient's had a history of pneumonia and aspiration pneumonia before in the past.  No recent changes in medications.  No confusion or altered mental status per caregiver.  Patient also has a history of seizures, diabetes, hypertension, headache, keratoconus  Past Medical History  Diagnosis Date  . Diabetes mellitus   . Hypertension   . Seizure   . Hypercholesterolemia   . CNS degenerative disease   . Headache   . Arthritis   . Gout   . Low back pain radiating to left leg   . Keratoconus   . Supranuclear palsy     Past Surgical History  Procedure Laterality Date  . Fracture surgery    . Peg tube placement  2008  . Left shoulder surgery  2002  . Corneal implant  2003    History reviewed. No pertinent family history.  History  Substance Use Topics  . Smoking status: Former Games developer  . Smokeless tobacco: Not on file  . Alcohol Use: No      Review of Systems  All other systems reviewed and are negative.    Allergies  Review of patient's allergies indicates no known allergies.  Home Medications   Current Outpatient Rx  Name  Route  Sig  Dispense  Refill  . albuterol (PROVENTIL) (2.5 MG/3ML) 0.083% nebulizer solution   Nebulization   Take 2.5 mg by nebulization 2 (two) times daily.          Marland Kitchen allopurinol (ZYLOPRIM) 300 MG tablet   Tube   Give 300 mg by tube daily. Via tube         .  carbidopa-levodopa (SINEMET) 25-250 MG per tablet   Tube   Give 1 tablet by tube 4 (four) times daily. Via tube         . cholecalciferol (VITAMIN D) 1000 UNITS tablet   Tube   Give 1,000 Units by tube 2 (two) times daily.          . citalopram (CELEXA) 40 MG tablet   Tube   Give 40 mg by tube daily. Via tube         . esomeprazole (NEXIUM) 40 MG capsule   Tube   Give 40 mg by tube daily before breakfast. Via tube         . gabapentin (NEURONTIN) 300 MG capsule   Tube   Give 1,500 mg by tube daily. Via tube         . HYDROcodone-acetaminophen (LORTAB) 7.5-500 MG per tablet   Tube   Give 1 tablet by tube every 6 (six) hours as needed. For pain         . lacosamide (VIMPAT) 200 MG TABS   Tube   Give 100 mg by tube 2 (two) times daily. Viva tube         . topiramate (  TOPAMAX) 100 MG tablet   Tube   Give 250 mg by tube 2 (two) times daily. One and one-half tab via tube every morning and one tab every evening         . traZODone (DESYREL) 50 MG tablet   Tube   Give 50 mg by tube at bedtime. Via tube           BP 108/78  Pulse 76  Temp(Src) 98.8 F (37.1 C) (Oral)  Resp 18  SpO2 98%  Physical Exam  Nursing note and vitals reviewed. Constitutional: He appears well-developed and well-nourished.  HENT:  Head: Normocephalic and atraumatic.  Eyes: EOM are normal.  Neck: Normal range of motion.  Cardiovascular: Normal rate, regular rhythm, normal heart sounds and intact distal pulses.   Pulmonary/Chest: Effort normal and breath sounds normal. No respiratory distress.  Abdominal: Soft. He exhibits no distension. There is no tenderness.  PEG tube in place without secondary signs of surrounding cellulitis  Musculoskeletal: Normal range of motion.  Neurological: He is alert.  Grossly moves his arms and legs at baseline per family  Skin: Skin is warm and dry.  Psychiatric: He has a normal mood and affect. Judgment normal.    ED Course  Procedures  (including critical care time)  Labs Reviewed  CBC - Abnormal; Notable for the following:    WBC 10.6 (*)    Platelets 146 (*)    All other components within normal limits  BASIC METABOLIC PANEL - Abnormal; Notable for the following:    Creatinine, Ser 0.47 (*)    All other components within normal limits  URINALYSIS, ROUTINE W REFLEX MICROSCOPIC   Dg Chest 2 View  10/01/2012  *RADIOLOGY REPORT*  Clinical Data: Weakness.  CHEST - 2 VIEW  Comparison: 08/29/2012  Findings: Shallow inspiration with linear atelectasis in the right lung base.  Normal heart size and pulmonary vascularity.  No definite consolidation or airspace disease.  No pneumothorax.  No blunting of costophrenic angles.  Degenerative changes in the thoracolumbar spine with bridging osteophytes suggesting ankylosis. Postoperative changes in the left shoulder.  Old right rib fractures.  IMPRESSION: Shallow inspiration with probable linear atelectasis in the right lung base.  Ankylosis of the spine.   Original Report Authenticated By: Burman Nieves, M.D.    Ct Head Wo Contrast  10/01/2012  *RADIOLOGY REPORT*  Clinical Data: Headache and increasing weakness for 2 days.  CT HEAD WITHOUT CONTRAST  Technique:  Contiguous axial images were obtained from the base of the skull through the vertex without contrast.  Comparison: 06/23/2011  Findings: Diffuse cerebral atrophy.  No ventricular dilatation. Focal low attenuation in the right thalamus suggesting old lacunar infarct, stable since previous study.  No mass effect or midline shift.  No abnormal extra-axial fluid collections.  Gray-white matter junctions are distinct.  Basal cisterns are not effaced.  No evidence of acute intracranial hemorrhage.  No depressed skull fractures.  Visualized paranasal sinuses and mastoid air cells are not opacified.  Postoperative changes at C1 and C2.  Tortuous basilar artery.  Vascular calcifications.  IMPRESSION: No acute intracranial abnormalities.  Mild  chronic atrophy.  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   Original Report Authenticated By: Burman Nieves, M.D.    I personally reviewed the imaging tests through PACS system I reviewed available ER/hospitalization records through the EMR    No diagnosis found.    MDM    Is a difficult case as the patient has a severe neurologic problem which  is his super nuclear palsy.  No obvious cause her symptoms are found today.  His labs and urine looked normal.  CT scan shows nothing acute.  The patient and the PCP followup in the next 24-48 hours.        Lyanne Co, MD 10/01/12 [phone]

## 2012-10-01 NOTE — ED Notes (Signed)
Pt is aware of the need for urine, however states he is still unable at this time. Urinal at bedside.

## 2012-10-09 ENCOUNTER — Encounter: Payer: Self-pay | Admitting: Neurology

## 2012-10-09 ENCOUNTER — Ambulatory Visit (INDEPENDENT_AMBULATORY_CARE_PROVIDER_SITE_OTHER): Payer: Medicare Other | Admitting: Neurology

## 2012-10-09 VITALS — BP 118/70 | HR 76 | Temp 96.9°F | Ht 67.0 in

## 2012-10-09 DIAGNOSIS — F322 Major depressive disorder, single episode, severe without psychotic features: Secondary | ICD-10-CM | POA: Insufficient documentation

## 2012-10-09 DIAGNOSIS — G959 Disease of spinal cord, unspecified: Secondary | ICD-10-CM

## 2012-10-09 DIAGNOSIS — F32A Depression, unspecified: Secondary | ICD-10-CM | POA: Insufficient documentation

## 2012-10-09 DIAGNOSIS — IMO0002 Reserved for concepts with insufficient information to code with codable children: Secondary | ICD-10-CM

## 2012-10-09 DIAGNOSIS — F329 Major depressive disorder, single episode, unspecified: Secondary | ICD-10-CM

## 2012-10-09 DIAGNOSIS — M4712 Other spondylosis with myelopathy, cervical region: Secondary | ICD-10-CM

## 2012-10-09 DIAGNOSIS — S12000A Unspecified displaced fracture of first cervical vertebra, initial encounter for closed fracture: Secondary | ICD-10-CM | POA: Insufficient documentation

## 2012-10-09 DIAGNOSIS — S12000S Unspecified displaced fracture of first cervical vertebra, sequela: Secondary | ICD-10-CM

## 2012-10-09 DIAGNOSIS — F3289 Other specified depressive episodes: Secondary | ICD-10-CM

## 2012-10-09 HISTORY — DX: Disease of spinal cord, unspecified: G95.9

## 2012-10-09 HISTORY — DX: Depression, unspecified: F32.A

## 2012-10-09 HISTORY — DX: Unspecified displaced fracture of first cervical vertebra, initial encounter for closed fracture: S12.000A

## 2012-10-09 NOTE — Patient Instructions (Addendum)
I think overall you are doing fairly well and are stable at this point.  I do have some generic suggestions for you today:  Do not attempt to eat by mouth, you are at risk for aspiration and pneumonia which you have had before. I do not mind you sucking on ice occasionally but would not recommend that you eat or drink anything beyond that. I do not think we need to make any changes in your medications at this point. I think you're stable enough that I can see you back in 6 months, sooner if we need to. Please call us if you have any interim questions, concerns, or problems or updates to need to discuss.  Brett Canales is my clinical assistant and will answer any of your questions and relay your messages to me and will give you my messages.   Our phone number is [phone]. We also have an after hours call service for urgent matters and there is a physician on-call for urgent questions. For any emergencies you know to call 911 or go to the nearest emergency room.
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
## 2012-10-18 ENCOUNTER — Encounter (HOSPITAL_COMMUNITY): Payer: Self-pay | Admitting: Emergency Medicine

## 2012-10-18 ENCOUNTER — Emergency Department (HOSPITAL_COMMUNITY)
Admission: EM | Admit: 2012-10-18 | Discharge: 2012-10-18 | Disposition: A | Discharge status: Home / Self Care | Payer: Medicare Other | Attending: Emergency Medicine | Admitting: Emergency Medicine

## 2012-10-18 ENCOUNTER — Emergency Department (HOSPITAL_COMMUNITY): Payer: Medicare Other

## 2012-10-18 DIAGNOSIS — F329 Major depressive disorder, single episode, unspecified: Secondary | ICD-10-CM | POA: Insufficient documentation

## 2012-10-18 DIAGNOSIS — M129 Arthropathy, unspecified: Secondary | ICD-10-CM | POA: Insufficient documentation

## 2012-10-18 DIAGNOSIS — F3289 Other specified depressive episodes: Secondary | ICD-10-CM | POA: Insufficient documentation

## 2012-10-18 DIAGNOSIS — W010XXA Fall on same level from slipping, tripping and stumbling without subsequent striking against object, initial encounter: Secondary | ICD-10-CM | POA: Insufficient documentation

## 2012-10-18 DIAGNOSIS — Z8669 Personal history of other diseases of the nervous system and sense organs: Secondary | ICD-10-CM | POA: Insufficient documentation

## 2012-10-18 DIAGNOSIS — I10 Essential (primary) hypertension: Secondary | ICD-10-CM | POA: Insufficient documentation

## 2012-10-18 DIAGNOSIS — Y833 Surgical operation with formation of external stoma as the cause of abnormal reaction of the patient, or of later complication, without mention of misadventure at the time of the procedure: Secondary | ICD-10-CM | POA: Insufficient documentation

## 2012-10-18 DIAGNOSIS — Y92009 Unspecified place in unspecified non-institutional (private) residence as the place of occurrence of the external cause: Secondary | ICD-10-CM | POA: Insufficient documentation

## 2012-10-18 DIAGNOSIS — Y9301 Activity, walking, marching and hiking: Secondary | ICD-10-CM | POA: Insufficient documentation

## 2012-10-18 DIAGNOSIS — M109 Gout, unspecified: Secondary | ICD-10-CM | POA: Insufficient documentation

## 2012-10-18 DIAGNOSIS — K9429 Other complications of gastrostomy: Secondary | ICD-10-CM | POA: Insufficient documentation

## 2012-10-18 DIAGNOSIS — Z79899 Other long term (current) drug therapy: Secondary | ICD-10-CM | POA: Insufficient documentation

## 2012-10-18 DIAGNOSIS — Z8781 Personal history of (healed) traumatic fracture: Secondary | ICD-10-CM | POA: Insufficient documentation

## 2012-10-18 DIAGNOSIS — E7800 Pure hypercholesterolemia, unspecified: Secondary | ICD-10-CM | POA: Insufficient documentation

## 2012-10-18 DIAGNOSIS — Z431 Encounter for attention to gastrostomy: Secondary | ICD-10-CM

## 2012-10-18 DIAGNOSIS — G40909 Epilepsy, unspecified, not intractable, without status epilepticus: Secondary | ICD-10-CM | POA: Insufficient documentation

## 2012-10-18 DIAGNOSIS — E119 Type 2 diabetes mellitus without complications: Secondary | ICD-10-CM | POA: Insufficient documentation

## 2012-10-18 DIAGNOSIS — Z8739 Personal history of other diseases of the musculoskeletal system and connective tissue: Secondary | ICD-10-CM | POA: Insufficient documentation

## 2012-10-18 DIAGNOSIS — Z87891 Personal history of nicotine dependence: Secondary | ICD-10-CM | POA: Insufficient documentation

## 2012-10-18 MED ORDER — IOHEXOL 300 MG/ML  SOLN
50.0000 mL | Freq: Once | INTRAMUSCULAR | Status: AC | PRN
  Administered 2012-10-18: 30 mL

## 2012-10-18 NOTE — ED Provider Notes (Signed)
History     CSN: [id number]  Arrival date & time 10/18/12  4098   First MD Initiated Contact with Patient 10/18/12 [phone]      Chief Complaint  Patient presents with  . Fall    (Consider location/radiation/quality/duration/timing/severity/associated sxs/prior treatment) Patient is a 57 y.o. male presenting with fall. The history is provided by the patient and a parent.  Fall   patient here after having his PEG tube dislodged after falling. Patient tripped and did not strike his head. No loss of consciousness. Slight back discomfort but otherwise has no complaints of. History of muscle degenerative disease. PEG tube fell out approximately 1 hour prior to arrival. Denies any hip or neck or head pain. Symptoms have been persistent and nothing makes them better worse. No treatment used prior to arrival.  Past Medical History  Diagnosis Date  . Diabetes mellitus   . Hypertension   . Seizure   . Hypercholesterolemia   . CNS degenerative disease   . Headache   . Arthritis   . Gout   . Low back pain radiating to left leg   . Keratoconus   . Supranuclear palsy   . Cervical myelopathy 10/09/2012  . C1 cervical fracture 10/09/2012  . Depression 10/09/2012    Past Surgical History  Procedure Laterality Date  . Fracture surgery    . Peg tube placement  2008  . Left shoulder surgery  2002  . Corneal implant  2003  . Cholecystectomy N/A February, 2014    No family history on file.  History  Substance Use Topics  . Smoking status: Former Games developer  . Smokeless tobacco: Not on file  . Alcohol Use: No      Review of Systems  All other systems reviewed and are negative.    Allergies  Review of patient's allergies indicates no known allergies.  Home Medications   Current Outpatient Rx  Name  Route  Sig  Dispense  Refill  . albuterol (PROVENTIL) (2.5 MG/3ML) 0.083% nebulizer solution   Nebulization   Take 2.5 mg by nebulization 2 (two) times daily.          Marland Kitchen allopurinol  (ZYLOPRIM) 300 MG tablet   Tube   Give 300 mg by tube daily. Via tube         . carbidopa-levodopa (SINEMET) 25-250 MG per tablet   Tube   Give 1 tablet by tube 4 (four) times daily. Via tube         . cholecalciferol (VITAMIN D) 1000 UNITS tablet   Tube   Give 1,000 Units by tube 2 (two) times daily.          . citalopram (CELEXA) 40 MG tablet   Tube   Give 40 mg by tube daily. Via tube         . esomeprazole (NEXIUM) 40 MG capsule   Tube   Give 40 mg by tube daily before breakfast. Via tube         . gabapentin (NEURONTIN) 300 MG capsule   Tube   Give 600-900 mg by tube 2 (two) times daily. 3 caps in am, 2 caps in pm.         . HYDROcodone-acetaminophen (LORTAB) 7.5-500 MG per tablet   Tube   Give 1 tablet by tube every 6 (six) hours as needed. For pain         . lacosamide (VIMPAT) 200 MG TABS   Tube   Give 100 mg by tube 2 (two)  times daily. Viva tube         . topiramate (TOPAMAX) 100 MG tablet   Tube   Give 100-150 mg by tube 2 (two) times daily. 1.5 tabs in am, 1 tab in pm.         . traZODone (DESYREL) 50 MG tablet   Tube   Give 50 mg by tube at bedtime. Via tube         . venlafaxine XR (EFFEXOR-XR) 37.5 MG 24 hr capsule   Tube   Give 37.5 mg by tube daily.           BP 109/69  Pulse 87  Temp(Src) 97.9 F (36.6 C) (Oral)  Resp 18  SpO2 99%  Physical Exam  Nursing note and vitals reviewed. Constitutional: He appears well-developed and well-nourished.  Non-toxic appearance. No distress.  HENT:  Head: Normocephalic and atraumatic.  Eyes: Conjunctivae, EOM and lids are normal. Pupils are equal, round, and reactive to light.  Neck: Normal range of motion. Neck supple. No tracheal deviation present. No mass present.  Cardiovascular: Normal rate, regular rhythm and normal heart sounds.  Exam reveals no gallop.   No murmur heard. Pulmonary/Chest: Effort normal and breath sounds normal. No stridor. No respiratory distress. He has no  decreased breath sounds. He has no wheezes. He has no rhonchi. He has no rales.  Abdominal: Soft. Normal appearance and bowel sounds are normal. He exhibits no distension. There is no tenderness. There is no rebound and no CVA tenderness.  Gastrostomy site noted with slight erythema around the lesion. No active bleeding noted. Location is the patient's left upper quadrant  Musculoskeletal: Normal range of motion. He exhibits no edema and no tenderness.  Neurological: He is alert. No cranial nerve deficit or sensory deficit. GCS eye subscore is 4. GCS verbal subscore is 5. GCS motor subscore is 6.  Patient is a neurological baseline according to his mother  Skin: Skin is warm and dry. No abrasion and no rash noted.  Psychiatric: His affect is blunt.    ED Course  Gastrostomy tube replacement Date/Time: 10/18/2012 9:34 AM Performed by: Toy Baker Authorized by: Lorre Nick T Consent: Verbal consent obtained. Risks and benefits: risks, benefits and alternatives were discussed Consent given by: parent Patient identity confirmed: verbally with patient Time out: Immediately prior to procedure a "time out" was called to verify the correct patient, procedure, equipment, support staff and site/side marked as required. Local anesthesia used: no Patient sedated: no Patient tolerance: Patient tolerated the procedure well with no immediate complications.   (including critical care time)  Labs Reviewed - No data to display No results found.   No diagnosis found.    MDM  g-tube in good position confirmed by xray, pt stable for d/c        Toy Baker, MD 10/18/12 1100

## 2012-10-18 NOTE — ED Notes (Signed)
Pt began walking with walker and got off balance pt has a home therapist help him and pt lost balance and fell . Scratched back with abrsion to lower back area. No loc, alert per normal family. llq peg tube came disloged with the fall. Red around site with clear drainage.

## 2012-11-06 ENCOUNTER — Ambulatory Visit: Payer: Self-pay | Admitting: Neurology

## 2012-11-14 ENCOUNTER — Other Ambulatory Visit: Payer: Self-pay | Admitting: Neurology

## 2012-11-14 NOTE — Telephone Encounter (Signed)
[facility] patient assigned to Dr Frances Furbish.  Has an appt in Oct.

## 2012-11-25 ENCOUNTER — Other Ambulatory Visit: Payer: Self-pay | Admitting: Neurology

## 2012-11-26 ENCOUNTER — Other Ambulatory Visit: Payer: Self-pay | Admitting: Neurology

## 2012-11-28 ENCOUNTER — Other Ambulatory Visit: Payer: Self-pay | Admitting: Neurology

## 2012-11-29 ENCOUNTER — Other Ambulatory Visit: Payer: Self-pay | Admitting: Neurology

## 2012-12-02 ENCOUNTER — Other Ambulatory Visit: Payer: Self-pay | Admitting: Neurology

## 2012-12-02 NOTE — Telephone Encounter (Signed)
This is a duplicate request.  Pharmacy has sent request roughly 7 times.  I have called them multiple times and they say the patient has picked up Rx.  They say they keep deleting the request and do not know why we keep getting it.

## 2012-12-03 ENCOUNTER — Other Ambulatory Visit: Payer: Self-pay | Admitting: Neurology

## 2013-01-17 ENCOUNTER — Other Ambulatory Visit: Payer: Self-pay | Admitting: Neurology

## 2013-01-20 ENCOUNTER — Other Ambulatory Visit: Payer: Self-pay

## 2013-01-20 MED ORDER — ALLOPURINOL 300 MG PO TABS: 300.0000 mg | ORAL_TABLET | Freq: Every day | ORAL | Status: DC

## 2013-01-20 NOTE — Telephone Encounter (Signed)
[facility] patient assigned to Dr Athar.  

## 2013-02-12 ENCOUNTER — Other Ambulatory Visit: Payer: Self-pay | Admitting: Neurology

## 2013-02-19 ENCOUNTER — Other Ambulatory Visit: Payer: Self-pay | Admitting: Neurology

## 2013-02-19 ENCOUNTER — Other Ambulatory Visit: Payer: Self-pay

## 2013-02-19 MED ORDER — VENLAFAXINE HCL ER 37.5 MG PO CP24: 37.5000 mg | ORAL_CAPSULE | Freq: Every day | ORAL | Status: DC

## 2013-02-19 NOTE — Telephone Encounter (Signed)
I have been out of the office since 08/12

## 2013-02-21 ENCOUNTER — Telehealth: Payer: Self-pay | Admitting: *Deleted

## 2013-02-21 NOTE — Telephone Encounter (Signed)
Alexander [name] called from the CAP program and is asking about order for supplies, (diapers and chux).  I called her and relayed did not receive the request that was faxed.  I told her that this should go thru pcp, and gave her the name listed in EPIC Alexander Po, NP) and the phone and fax number.  She would try them.

## 2013-02-22 ENCOUNTER — Other Ambulatory Visit: Payer: Self-pay | Admitting: Neurology

## 2013-02-22 NOTE — Telephone Encounter (Signed)
Duplicate Request  Receipt confirmed by pharmacy (01/20/2013 7:48 AM EDT)

## 2013-03-29 ENCOUNTER — Emergency Department (HOSPITAL_COMMUNITY): Payer: Medicare Other

## 2013-03-29 ENCOUNTER — Inpatient Hospital Stay (HOSPITAL_COMMUNITY)
Admission: EM | Admit: 2013-03-29 | Discharge: 2013-03-31 | DRG: 195 | Disposition: A | Discharge status: Home / Self Care | Payer: Medicare Other | Attending: Internal Medicine | Admitting: Internal Medicine

## 2013-03-29 ENCOUNTER — Encounter (HOSPITAL_COMMUNITY): Payer: Self-pay | Admitting: *Deleted

## 2013-03-29 DIAGNOSIS — Z87891 Personal history of nicotine dependence: Secondary | ICD-10-CM

## 2013-03-29 DIAGNOSIS — G319 Degenerative disease of nervous system, unspecified: Secondary | ICD-10-CM | POA: Diagnosis present

## 2013-03-29 DIAGNOSIS — E7800 Pure hypercholesterolemia, unspecified: Secondary | ICD-10-CM | POA: Diagnosis present

## 2013-03-29 DIAGNOSIS — D72829 Elevated white blood cell count, unspecified: Secondary | ICD-10-CM | POA: Diagnosis present

## 2013-03-29 DIAGNOSIS — J189 Pneumonia, unspecified organism: Principal | ICD-10-CM | POA: Diagnosis present

## 2013-03-29 DIAGNOSIS — R9431 Abnormal electrocardiogram [ECG] [EKG]: Secondary | ICD-10-CM | POA: Diagnosis present

## 2013-03-29 DIAGNOSIS — I10 Essential (primary) hypertension: Secondary | ICD-10-CM | POA: Diagnosis present

## 2013-03-29 DIAGNOSIS — F329 Major depressive disorder, single episode, unspecified: Secondary | ICD-10-CM | POA: Diagnosis present

## 2013-03-29 DIAGNOSIS — E119 Type 2 diabetes mellitus without complications: Secondary | ICD-10-CM | POA: Diagnosis present

## 2013-03-29 DIAGNOSIS — Z931 Gastrostomy status: Secondary | ICD-10-CM

## 2013-03-29 DIAGNOSIS — F3289 Other specified depressive episodes: Secondary | ICD-10-CM | POA: Diagnosis present

## 2013-03-29 MED ORDER — LEVOFLOXACIN IN D5W 750 MG/150ML IV SOLN
750.0000 mg | INTRAVENOUS | Status: DC
  Administered 2013-03-30 – 2013-03-31 (×2): 750 mg via INTRAVENOUS
  Filled 2013-03-29 (×3): qty 150

## 2013-03-29 NOTE — ED Notes (Signed)
Caregiver reports pt has been "acting down, unlike himself the past 4 days." SOB and dizziness started last night.

## 2013-03-29 NOTE — ED Provider Notes (Signed)
CSN: [id number]     Arrival date & time 03/29/13  2215 History   First MD Initiated Contact with Patient 03/29/13 2316     Chief Complaint  Patient presents with  . Shortness of Breath  . Chest Pain   (Consider location/radiation/quality/duration/timing/severity/associated sxs/prior Treatment) HPI Hx per family and caregiver with limited history per PT due to CNS degenerative disease.  He lives at home and not feeling well last few days and worse tonight with cough, SOB and some CP.  He has had yellow productive sputum, no blood and family concerned he has PNA. No leg pain or swelling. Temp was reported 94 yesterday at home.   PCP in [place]  Past Medical History  Diagnosis Date  . Diabetes mellitus   . Hypertension   . Seizure   . Hypercholesterolemia   . CNS degenerative disease   . Headache(784.0)   . Arthritis   . Gout   . Low back pain radiating to left leg   . Keratoconus   . Supranuclear palsy   . Cervical myelopathy 10/09/2012  . C1 cervical fracture 10/09/2012  . Depression 10/09/2012   Past Surgical History  Procedure Laterality Date  . Fracture surgery    . Peg tube placement  2008  . Left shoulder surgery  2002  . Corneal implant  2003  . Cholecystectomy N/A February, 2014   History reviewed. No pertinent family history. History  Substance Use Topics  . Smoking status: Former Games developer  . Smokeless tobacco: Not on file  . Alcohol Use: No    Review of Systems  Constitutional: Negative for chills.  HENT: Negative for neck pain and neck stiffness.   Respiratory: Positive for cough and shortness of breath.   Cardiovascular: Positive for chest pain.  Gastrointestinal: Negative for abdominal pain.  Genitourinary: Negative for flank pain.  Musculoskeletal: Negative for back pain.  Skin: Negative for rash.  Neurological: Negative for headaches.  All other systems reviewed and are negative.    Allergies  Review of patient's allergies indicates no known  allergies.  Home Medications   Current Outpatient Rx  Name  Route  Sig  Dispense  Refill  . allopurinol (ZYLOPRIM) 300 MG tablet   Tube   Give 300 mg by tube every morning.         . cholecalciferol (VITAMIN D) 1000 UNITS tablet   Tube   Give 1,000 Units by tube 2 (two) times daily.          . citalopram (CELEXA) 40 MG tablet   Tube   Give 40 mg by tube daily. Via tube         . cyclobenzaprine (FLEXERIL) 10 MG tablet   Tube   Give 1 tablet by tube 3 (three) times daily as needed for muscle spasms.          Marland Kitchen esomeprazole (NEXIUM) 40 MG capsule   Tube   Give 40 mg by tube daily before breakfast. Via tube         . gabapentin (NEURONTIN) 300 MG capsule   Tube   Give 600-900 mg by tube 2 (two) times daily. Take 3 capsules in the morning and 2 capsules in the evening.         Marland Kitchen HYDROcodone-acetaminophen (LORTAB) 7.5-500 MG per tablet   Tube   Give 1 tablet by tube every 6 (six) hours as needed for pain. For pain         . Lacosamide (VIMPAT) 100 MG TABS  Tube   Give 100 mg by tube 2 (two) times daily.         . meloxicam (MOBIC) 15 MG tablet   Tube   Give 1 tablet by tube every morning.          . nystatin cream (MYCOSTATIN)   Topical   Apply 1 application topically 2 (two) times daily as needed (irritation of incision).          . silver sulfADIAZINE (SILVADENE) 1 % cream   Topical   Apply 1 application topically 2 (two) times daily as needed (irritation of incision).          . topiramate (TOPAMAX) 100 MG tablet   Tube   Give 100-150 mg by tube 2 (two) times daily. Take 1.5 tablets in the morning and 1 tablet in the evening         . traZODone (DESYREL) 50 MG tablet   Tube   Give 50 mg by tube at bedtime. Via tube         . venlafaxine XR (EFFEXOR-XR) 37.5 MG 24 hr capsule   Tube   Give 37.5 mg by tube every morning.           BP 112/69  Pulse 92  Temp(Src) 99 F (37.2 C) (Oral)  Resp 18  SpO2 93% Physical Exam   Constitutional: He appears well-developed and well-nourished.  HENT:  Head: Normocephalic and atraumatic.  Eyes: EOM are normal. Pupils are equal, round, and reactive to light.  Neck: Neck supple.  Cardiovascular: Normal rate, regular rhythm and intact distal pulses.   Pulmonary/Chest: No stridor.  Decreased and course breath sounds bilat. No tachypnea or resp distress  Abdominal: Soft. There is no tenderness.  Musculoskeletal: Normal range of motion. He exhibits no edema.  Neurological:  Awake, alert, follows commands, baseline per family  Skin: Skin is warm and dry.    ED Course  Procedures (including critical care time) Labs Review Labs Reviewed  CBC - Abnormal; Notable for the following:    WBC 12.0 (*)    All other components within normal limits  COMPREHENSIVE METABOLIC PANEL - Abnormal; Notable for the following:    Total Bilirubin 0.2 (*)    All other components within normal limits  TROPONIN I  POCT I-STAT, CHEM 8   Imaging Review Dg Chest 2 View  03/30/2013   CLINICAL DATA:  Short of breath and chest pain for 2 days. Ex-smoker.  EXAM: CHEST  2 VIEW  COMPARISON:  10/01/2012  FINDINGS: The lateral view is degraded by overlying arms. 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  Lower cervical spine fixation. Midline trachea. Borderline cardiomegaly, accentuated by AP portable technique. No definite pleural fluid. Low lung volumes. Patchy bibasilar opacities are greater on the left than right.  IMPRESSION: Low lung volume frontal radiograph with bibasilar pulmonary opacities. Favor atelectasis. If symptoms warrant, consider PA radiographic followup.  Borderline cardiomegaly, without congestive failure.   Electronically Signed   By: Jeronimo Greaves   On: 03/30/2013 00:36     Date: 03/29/2013  Rate: 86  Rhythm: normal sinus rhythm  QRS Axis: normal  Intervals: normal  ST/T Wave abnormalities: nonspecific ST changes  Conduction  Disutrbances:none  Narrative Interpretation: sinus with inf/ lat ST elevations less than 1mm no ST depressions  Old EKG Reviewed: 02-06-11 NSR rate 61 NSST changes  IV ABx, O2 provided for borderline hypoxia  Presentation suggests PNA, abnormal ECG reviewed.  No CP in the ER.  D/w Dr Julian Reil, will admit MDM  DX : CAP, Abnormal ECG CXR, labs, ABx MED admit   Sunnie Nielsen, MD 03/30/13 [phone]

## 2013-03-29 NOTE — ED Notes (Signed)
Patient is alert and oriented to baseline.  His sitter states that he has not been acting right for the last few days. Today the patient woke up and states that he couldn't get his breath.  This evening the patient stated that he felt He couldn't catch his breath and the room was spinning.

## 2013-03-30 DIAGNOSIS — J189 Pneumonia, unspecified organism: Secondary | ICD-10-CM | POA: Diagnosis present

## 2013-03-30 LAB — POCT I-STAT, CHEM 8
BUN: 19 mg/dL (ref 6–23)
Calcium, Ion: 1.22 mmol/L (ref 1.12–1.23)
Creatinine, Ser: 0.8 mg/dL (ref 0.50–1.35)
Hemoglobin: 16 g/dL (ref 13.0–17.0)
Potassium: 3.9 mEq/L (ref 3.5–5.1)
TCO2: 25 mmol/L (ref 0–100)

## 2013-03-30 LAB — TROPONIN I: Troponin I: <0.3 ng/mL (ref <0.30)

## 2013-03-30 LAB — CBC
HCT: 46.3 % (ref 39.0–52.0)
Hemoglobin: 14.4 g/dL (ref 13.0–17.0)
Hemoglobin: 15.2 g/dL (ref 13.0–17.0)
MCH: 31.3 pg (ref 26.0–34.0)
MCHC: 32.8 g/dL (ref 30.0–36.0)
Platelets: 163 10*3/uL (ref 150–400)
RBC: 4.6 MIL/uL (ref 4.22–5.81)
RBC: 4.82 MIL/uL (ref 4.22–5.81)
RDW: 12.8 % (ref 11.5–15.5)
RDW: 12.9 % (ref 11.5–15.5)
WBC: 12 10*3/uL — ABNORMAL HIGH (ref 4.0–10.5)
WBC: 9.8 10*3/uL (ref 4.0–10.5)

## 2013-03-30 LAB — COMPREHENSIVE METABOLIC PANEL
Albumin: 3.8 g/dL (ref 3.5–5.2)
Alkaline Phosphatase: 87 U/L (ref 39–117)
BUN: 18 mg/dL (ref 6–23)
CO2: 25 mEq/L (ref 19–32)
Chloride: 103 mEq/L (ref 96–112)
GFR calc Af Amer: >90 mL/min (ref >90)
GFR calc non Af Amer: >90 mL/min (ref >90)
Glucose, Bld: 91 mg/dL (ref 70–99)
Potassium: 3.9 mEq/L (ref 3.5–5.1)
Total Bilirubin: 0.2 mg/dL — ABNORMAL LOW (ref 0.3–1.2)

## 2013-03-30 LAB — BASIC METABOLIC PANEL
CO2: 25 mEq/L (ref 19–32)
Calcium: 9 mg/dL (ref 8.4–10.5)
Chloride: 104 mEq/L (ref 96–112)
GFR calc Af Amer: >90 mL/min (ref >90)
Potassium: 3.9 mEq/L (ref 3.5–5.1)
Sodium: 138 mEq/L (ref 135–145)

## 2013-03-30 LAB — HIV ANTIBODY (ROUTINE TESTING W REFLEX): HIV: NONREACTIVE

## 2013-03-30 LAB — GLUCOSE, CAPILLARY: Glucose-Capillary: 85 mg/dL (ref 70–99)

## 2013-03-30 LAB — STREP PNEUMONIAE URINARY ANTIGEN: Strep Pneumo Urinary Antigen: NEGATIVE

## 2013-03-30 MED ORDER — TOPIRAMATE 25 MG PO TABS
150.0000 mg | ORAL_TABLET | Freq: Every day | ORAL | Status: DC
  Administered 2013-03-30: 13:00:00 150 mg
  Filled 2013-03-30 (×2): qty 2

## 2013-03-30 MED ORDER — NYSTATIN 100000 UNIT/GM EX CREA
1.0000 "application " | TOPICAL_CREAM | Freq: Two times a day (BID) | CUTANEOUS | Status: DC | PRN
  Filled 2013-03-30: qty 15

## 2013-03-30 MED ORDER — TRAZODONE HCL 50 MG PO TABS
50.0000 mg | ORAL_TABLET | Freq: Every day | ORAL | Status: DC
  Administered 2013-03-30 (×2): 50 mg
  Filled 2013-03-30 (×4): qty 1

## 2013-03-30 MED ORDER — GABAPENTIN 300 MG PO CAPS: 600.0000 mg | ORAL_CAPSULE | Freq: Two times a day (BID) | ORAL | Status: DC

## 2013-03-30 MED ORDER — CYCLOBENZAPRINE HCL 10 MG PO TABS
10.0000 mg | ORAL_TABLET | Freq: Three times a day (TID) | ORAL | Status: DC | PRN
  Filled 2013-03-30: qty 1

## 2013-03-30 MED ORDER — SILVER SULFADIAZINE 1 % EX CREA: 1.0000 "application " | TOPICAL_CREAM | Freq: Two times a day (BID) | CUTANEOUS | Status: DC | PRN

## 2013-03-30 MED ORDER — HEPARIN SODIUM (PORCINE) 5000 UNIT/ML IJ SOLN
5000.0000 [IU] | Freq: Three times a day (TID) | INTRAMUSCULAR | Status: DC
  Administered 2013-03-30 – 2013-03-31 (×4): 5000 [IU] via SUBCUTANEOUS
  Filled 2013-03-30 (×7): qty 1

## 2013-03-30 MED ORDER — MELOXICAM 15 MG PO TABS
15.0000 mg | ORAL_TABLET | Freq: Every morning | ORAL | Status: DC
  Administered 2013-03-30: 15 mg
  Filled 2013-03-30 (×2): qty 1

## 2013-03-30 MED ORDER — GABAPENTIN 250 MG/5ML PO SOLN
900.0000 mg | Freq: Every day | ORAL | Status: DC
Start: 2013-03-30 — End: 2013-03-30

## 2013-03-30 MED ORDER — LACOSAMIDE 50 MG PO TABS: 100.0000 mg | ORAL_TABLET | Freq: Two times a day (BID) | ORAL | Status: DC

## 2013-03-30 MED ORDER — VENLAFAXINE HCL ER 37.5 MG PO CP24
37.5000 mg | ORAL_CAPSULE | Freq: Every morning | ORAL | Status: DC
  Filled 2013-03-30: qty 1

## 2013-03-30 MED ORDER — PANTOPRAZOLE SODIUM 40 MG PO PACK
80.0000 mg | PACK | Freq: Every day | ORAL | Status: DC
  Administered 2013-03-30: 80 mg
  Filled 2013-03-30 (×2): qty 40

## 2013-03-30 MED ORDER — TOPIRAMATE 25 MG PO TABS: 100.0000 mg | ORAL_TABLET | Freq: Two times a day (BID) | ORAL | Status: DC

## 2013-03-30 MED ORDER — ALLOPURINOL 300 MG PO TABS
300.0000 mg | ORAL_TABLET | Freq: Every morning | ORAL | Status: DC
  Administered 2013-03-30: 300 mg
  Filled 2013-03-30 (×2): qty 1

## 2013-03-30 MED ORDER — TOPIRAMATE 100 MG PO TABS
100.0000 mg | ORAL_TABLET | Freq: Every day | ORAL | Status: DC
  Administered 2013-03-30 (×2): 100 mg
  Filled 2013-03-30 (×3): qty 1

## 2013-03-30 MED ORDER — GABAPENTIN 250 MG/5ML PO SOLN
600.0000 mg | Freq: Every day | ORAL | Status: DC
  Administered 2013-03-30 (×2): 600 mg
  Filled 2013-03-30 (×4): qty 12

## 2013-03-30 MED ORDER — VITAMIN D3 25 MCG (1000 UNIT) PO TABS
1000.0000 [IU] | ORAL_TABLET | Freq: Two times a day (BID) | ORAL | Status: DC
  Administered 2013-03-30 (×2): 1000 [IU] via ORAL
  Filled 2013-03-30 (×4): qty 1

## 2013-03-30 MED ORDER — PANTOPRAZOLE SODIUM 40 MG PO TBEC: 80.0000 mg | DELAYED_RELEASE_TABLET | Freq: Every day | ORAL | Status: DC

## 2013-03-30 MED ORDER — SODIUM CHLORIDE 0.9 % IV SOLN
INTRAVENOUS | Status: AC
  Administered 2013-03-30: 13:00:00 via INTRAVENOUS
  Administered 2013-03-31: 1000 mL via INTRAVENOUS

## 2013-03-30 MED ORDER — VENLAFAXINE HCL 37.5 MG PO TABS
18.7500 mg | ORAL_TABLET | Freq: Two times a day (BID) | ORAL | Status: DC
  Administered 2013-03-30: 18.75 mg
  Filled 2013-03-30 (×5): qty 0.5

## 2013-03-30 MED ORDER — CITALOPRAM HYDROBROMIDE 40 MG PO TABS
40.0000 mg | ORAL_TABLET | Freq: Every day | ORAL | Status: DC
  Administered 2013-03-30: 40 mg
  Filled 2013-03-30 (×2): qty 1

## 2013-03-30 MED ORDER — GABAPENTIN 250 MG/5ML PO SOLN
900.0000 mg | Freq: Every day | ORAL | Status: DC
  Administered 2013-03-30: 900 mg
  Filled 2013-03-30 (×2): qty 18

## 2013-03-30 MED ORDER — HYDROCODONE-ACETAMINOPHEN 7.5-325 MG/15ML PO SOLN
15.0000 mL | Freq: Four times a day (QID) | ORAL | Status: DC | PRN
  Administered 2013-03-30 (×2): 15 mL
  Filled 2013-03-30 (×2): qty 15

## 2013-03-30 MED ORDER — LACOSAMIDE 50 MG PO TABS
100.0000 mg | ORAL_TABLET | Freq: Two times a day (BID) | ORAL | Status: DC
  Administered 2013-03-30: 100 mg
  Filled 2013-03-30 (×2): qty 2

## 2013-03-30 NOTE — H&P (Addendum)
[facility] History and Physical  ALASSANE KALAFUT ZOX:[id number] DOB: Jun 12, 1956 DOA: 03/29/2013  Referring physician: ED PCP: Dema Severin, NP   Chief Complaint: SOB, cough  HPI: Alexander [name] is a 57 y.o. male with PMH of CNS degenerative disease, who presents to the ED with his family and caregiver.  They note that patient has had increasing cough, ill appearing, for the past 2-3 days.  Cough was significantly worse tonight with SOB and CP.  Cough productive of yellowish sputum.  Family concerned he may have PNA as he has had PNA several times in past (been about a year since last PNA per family).  In the ED he was noted to have T of 99.8, O2 sats were 93%, WBC 12k, CXR demonstrated bibasilar atelectasis vs infiltrate.  Clinical diagnosis of CAP made, and patient started on levaquin, hospitalist asked to admit.  Review of Systems: Unable to perform due to patients CNS degenerative disease, he is non-verbal.  Past Medical History  Diagnosis Date  . Diabetes mellitus   . Hypertension   . Seizure   . Hypercholesterolemia   . CNS degenerative disease   . Headache(784.0)   . Arthritis   . Gout   . Low back pain radiating to left leg   . Keratoconus   . Supranuclear palsy   . Cervical myelopathy 10/09/2012  . C1 cervical fracture 10/09/2012  . Depression 10/09/2012   Past Surgical History  Procedure Laterality Date  . Fracture surgery    . Peg tube placement  2008  . Left shoulder surgery  2002  . Corneal implant  2003  . Cholecystectomy N/A February, 2014   Social History:  reports that he has quit smoking. He does not have any smokeless tobacco history on file. He reports that he does not drink alcohol or use illicit drugs.   No Known Allergies  History reviewed. No pertinent family history.   Prior to Admission medications   Medication Sig Start Date End Date Taking? Authorizing Provider  allopurinol (ZYLOPRIM) 300 MG tablet Give 300 mg by tube every morning.    Yes Historical Provider, MD  cholecalciferol (VITAMIN D) 1000 UNITS tablet Give 1,000 Units by tube 2 (two) times daily.    Yes Historical Provider, MD  citalopram (CELEXA) 40 MG tablet Give 40 mg by tube daily. Via tube   Yes Historical Provider, MD  cyclobenzaprine (FLEXERIL) 10 MG tablet Give 1 tablet by tube 3 (three) times daily as needed for muscle spasms.  03/07/13  Yes Historical Provider, MD  esomeprazole (NEXIUM) 40 MG capsule Give 40 mg by tube daily before breakfast. Via tube   Yes Historical Provider, MD  gabapentin (NEURONTIN) 300 MG capsule Give 600-900 mg by tube 2 (two) times daily. Take 3 capsules in the morning and 2 capsules in the evening.   Yes Historical Provider, MD  HYDROcodone-acetaminophen (LORTAB) 7.5-500 MG per tablet Give 1 tablet by tube every 6 (six) hours as needed for pain. For pain   Yes Historical Provider, MD  Lacosamide (VIMPAT) 100 MG TABS Give 100 mg by tube 2 (two) times daily.   Yes Historical Provider, MD  meloxicam (MOBIC) 15 MG tablet Give 1 tablet by tube every morning.  03/10/13  Yes Historical Provider, MD  nystatin cream (MYCOSTATIN) Apply 1 application topically 2 (two) times daily as needed (irritation of incision).  03/11/13  Yes Historical Provider, MD  silver sulfADIAZINE (SILVADENE) 1 % cream Apply 1 application topically 2 (two) times daily as needed (  irritation of incision).  03/11/13  Yes Historical Provider, MD  topiramate (TOPAMAX) 100 MG tablet Give 100-150 mg by tube 2 (two) times daily. Take 1.5 tablets in the morning and 1 tablet in the evening   Yes Historical Provider, MD  traZODone (DESYREL) 50 MG tablet Give 50 mg by tube at bedtime. Via tube   Yes Historical Provider, MD  venlafaxine XR (EFFEXOR-XR) 37.5 MG 24 hr capsule Give 37.5 mg by tube every morning.    Yes Historical Provider, MD   Physical Exam: Filed Vitals:   03/30/13 0022  BP:   Pulse:   Temp: 99.8 F (37.7 C)  Resp:     General:  NAD, resting comfortably in bed Eyes:  PEERLA EOMI ENT: mucous membranes moist Neck: supple w/o JVD Cardiovascular: RRR w/o MRG Respiratory: coarse BS bilateral bases, no respiratory distress Abdomen: soft, nt, nd, bs+ Skin: no rash nor lesion Musculoskeletal: MAE, full ROM all 4 extremities Psychiatric: normal tone and affect Neurologic: Awake, alert, follows commands, non-verbal, baseline per family  Labs on Admission:  Basic Metabolic Panel:  Recent Labs Lab 03/29/13 2355 03/30/13 0007  NA 139 142  K 3.9 3.9  CL 103 107  CO2 25  --   GLUCOSE 91 91  BUN 18 19  CREATININE 0.58 0.80  CALCIUM 9.3  --    Liver Function Tests:  Recent Labs Lab 03/29/13 2355  AST 18  ALT 15  ALKPHOS 87  BILITOT 0.2*  PROT 7.1  ALBUMIN 3.8   No results found for this basename: LIPASE, AMYLASE,  in the last 168 hours No results found for this basename: AMMONIA,  in the last 168 hours CBC:  Recent Labs Lab 03/29/13 2355 03/30/13 0007  WBC 12.0*  --   HGB 15.2 16.0  HCT 46.3 47.0  MCV 96.1  --   PLT 168  --    Cardiac Enzymes:  Recent Labs Lab 03/29/13 2355  TROPONINI <0.30    BNP (last 3 results) No results found for this basename: PROBNP,  in the last 8760 hours CBG: No results found for this basename: GLUCAP,  in the last 168 hours  Radiological Exams on Admission: Dg Chest 2 View  03/30/2013   CLINICAL DATA:  Short of breath and chest pain for 2 days. Ex-smoker.  EXAM: CHEST  2 VIEW  COMPARISON:  10/01/2012  FINDINGS: The lateral view is degraded by overlying arms. 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  Lower cervical spine fixation. Midline trachea. Borderline cardiomegaly, accentuated by AP portable technique. No definite pleural fluid. Low lung volumes. Patchy bibasilar opacities are greater on the left than right.  IMPRESSION: Low lung volume frontal radiograph with bibasilar pulmonary opacities. Favor atelectasis. If symptoms warrant, consider PA radiographic  followup.  Borderline cardiomegaly, without congestive failure.   Electronically Signed   By: Jeronimo Greaves   On: 03/30/2013 00:36    EKG: Ordered, but not yet in EPIC.  Assessment/Plan Principal Problem:   CAP (community acquired pneumonia)   1. CAP - Initially some question of atelectasis vs PNA on CXR but clinically this looks more like PNA.  Continuing levaquin started by EDP, on PNA pathway, sputum cultures ordered and pending.  Monitor O2 sats. 2. Neurodegenerative disorder - patient at baseline per family, have ordered to start PEG feeds per home regimin   Code Status: Full (must indicate code status--if unknown or must be presumed, indicate so) Family Communication: Spoke with wife and caregiver at bedside (indicate person  spoken with, if applicable, with phone number if by telephone) Disposition Plan: Admit to inpatient (indicate anticipated LOS)  Time spent: 70 min  Margerite Impastato M. [facility] Pager [phone]  If 7PM-7AM, please contact night-coverage [email or web address] Password [email or web address] 03/30/2013, 1:53 AM

## 2013-03-30 NOTE — Progress Notes (Signed)
Patient expressed to have CP and pain in left shoulder. Patient vs were 118/78, hr 80s. Complete 12 lead ekg and found to be normal sinus rhythm. No other symptoms noted or observed. Patient is having increased cough that is productive. Will continue to monitor. Patient given PRN pain medication.

## 2013-03-30 NOTE — Progress Notes (Signed)
Patient admitted to room 1517 from ED with levaquin infusing. Patient has not  had blood cultures x2  drawn prior to antibiotics. Made Lenny Pastel. N.P. aware. Will get blood cultures this am with morning labs.

## 2013-03-30 NOTE — Progress Notes (Signed)
Notified Dr. Elvera Lennox as pt has not produced but 25ml of urine in his condom cath today. Bladder scan showed . New IVF orders given and will continue to monitor. Pt denies any discomfort in his abdomen pelvic area, no distention noted upon palpation.

## 2013-03-30 NOTE — Progress Notes (Addendum)
Per MD orders, foley cath inserted #7F. Some mild resistance met yet pt tolerated well. Immediate return of clear yellow, non-odorous urine. Bladder scan identified , urine return of 1175.  Stat lock used for stabilization. Peri Care was completed pre- insertion and post-insertion.

## 2013-03-30 NOTE — Progress Notes (Signed)
Assumed care of the pat. No changes in initial am assessment at this time Cont with plan of care 

## 2013-03-30 NOTE — Progress Notes (Addendum)
[facility] PROGRESS NOTE  Alexander [name] ZOX:[id number] DOB: January 25, 1956 DOA: 03/29/2013 PCP: Dema Severin, NP  HPI: Alexander [name] is a 57 y.o. male with PMH of CNS degenerative disease, who presents to the ED with his family and caregiver. They note that patient has had increasing cough, ill appearing, for the past 2-3 days. Cough was significantly worse tonight with SOB and CP. Cough productive of yellowish sputum. Family concerned he may have PNA as he has had PNA several times in past (been about a year since last PNA per family). In the ED he was noted to have T of 99.8, O2 sats were 93%, WBC 12k, CXR demonstrated bibasilar atelectasis vs infiltrate. Clinical diagnosis of CAP made, and patient started on levaquin, hospitalist asked to admit.  Assessment/Plan:  CAP - patient appreciates improvement, continue Levaquin.  Neurodegenerative disorder - patient at baseline per family, have ordered to start PEG feeds per home regimin  Code Status: Full Family Communication: none  Disposition Plan: inpatient, home 1-2 days  Consultants:  none  Procedures:  none  Anti-infectives   Start     Dose/Rate Route Frequency Ordered Stop   03/29/13 2345  levofloxacin (LEVAQUIN) IVPB 750 mg     750 mg 100 mL/hr over 90 Minutes Intravenous Every 24 hours 03/29/13 2341       Antibiotics Given (last 72 hours)   None     HPI/Subjective: Breathing better.  Objective: Filed Vitals:   03/30/13 0235 03/30/13 0500 03/30/13 0645 03/30/13 1340  BP: 118/78  103/69 118/82  Pulse:   72 64  Temp:   97.7 F (36.5 C) 97.9 F (36.6 C)  TempSrc:   Oral Oral  Resp:   18 18  Height:  5\' 10"  (1.778 m)    Weight:  88.5 kg (195 lb 1.7 oz)    SpO2:   94% 94%    Intake/Output Summary (Last 24 hours) at 03/30/13 1547 Last data filed at 03/30/13 1525  Gross per 24 hour  Intake     90 ml  Output   1175 ml  Net  -1085 ml   Filed Weights   03/30/13 0500  Weight: 88.5 kg (195 lb 1.7 oz)     Exam:  General:  NAD  Cardiovascular: regular rate and rhythm, without MRG  Respiratory: good air movement, clear on anterior auscultation  Abdomen: soft, not tender to palpation, positive bowel sounds  MSK: no peripheral edema  Neuro: moves all 4, increased tone  Data Reviewed: Basic Metabolic Panel:  Recent Labs Lab 03/29/13 2355 03/30/13 0007 03/30/13 0320  NA 139 142 138  K 3.9 3.9 3.9  CL 103 107 104  CO2 25  --  25  GLUCOSE 91 91 84  BUN 18 19 18   CREATININE 0.58 0.80 0.54  CALCIUM 9.3  --  9.0   Liver Function Tests:  Recent Labs Lab 03/29/13 2355  AST 18  ALT 15  ALKPHOS 87  BILITOT 0.2*  PROT 7.1  ALBUMIN 3.8   CBC:  Recent Labs Lab 03/29/13 2355 03/30/13 0007 03/30/13 0320  WBC 12.0*  --  9.8  HGB 15.2 16.0 14.4  HCT 46.3 47.0 44.1  MCV 96.1  --  95.9  PLT 168  --  163   Cardiac Enzymes:  Recent Labs Lab 03/29/13 2355  TROPONINI <0.30   CBG:  Recent Labs Lab 03/30/13 0259 03/30/13 0742 03/30/13 1122  GLUCAP 96 86 96   Studies: Dg Chest 2 View  03/30/2013  CLINICAL DATA:  Short of breath and chest pain for 2 days. Ex-smoker.  EXAM: CHEST  2 VIEW  COMPARISON:  10/01/2012  FINDINGS: The lateral view is degraded by overlying arms. 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  Lower cervical spine fixation. Midline trachea. Borderline cardiomegaly, accentuated by AP portable technique. No definite pleural fluid. Low lung volumes. Patchy bibasilar opacities are greater on the left than right.  IMPRESSION: Low lung volume frontal radiograph with bibasilar pulmonary opacities. Favor atelectasis. If symptoms warrant, consider PA radiographic followup.  Borderline cardiomegaly, without congestive failure.   Electronically Signed   By: Jeronimo Greaves   On: 03/30/2013 00:36    Scheduled Meds: . allopurinol  300 mg Per Tube q morning - 10a  . cholecalciferol  1,000 Units Oral BID  . citalopram  40 mg Per  Tube Daily  . gabapentin  600 mg Per Tube QHS  . gabapentin  900 mg Per Tube Daily  . heparin  5,000 Units Subcutaneous Q8H  . lacosamide  100 mg Per Tube BID  . levofloxacin  750 mg Intravenous Q24H  . meloxicam  15 mg Per Tube q morning - 10a  . pantoprazole sodium  80 mg Per Tube Daily  . topiramate  100 mg Per Tube QHS  . topiramate  150 mg Per Tube Daily  . traZODone  50 mg Per Tube QHS  . venlafaxine  18.75 mg Per Tube BID   Continuous Infusions: . sodium chloride 75 mL/hr at 03/30/13 1248    Principal Problem:   CAP (community acquired pneumonia)  Time spent: 25  Pamella Pert, MD [facility] Pager [phone]. If 7 PM - 7 AM, please contact night-coverage at [email or web address], password [facility] 03/30/2013, 3:47 PM  LOS: 1 day

## 2013-03-31 DIAGNOSIS — J189 Pneumonia, unspecified organism: Principal | ICD-10-CM

## 2013-03-31 LAB — LEGIONELLA ANTIGEN, URINE

## 2013-03-31 MED ORDER — LEVOFLOXACIN 750 MG PO TABS
750.0000 mg | ORAL_TABLET | Freq: Every day | ORAL | Status: DC
  Filled 2013-03-31: qty 1

## 2013-03-31 MED ORDER — LEVOFLOXACIN 750 MG PO TABS: 750.0000 mg | ORAL_TABLET | Freq: Every day | ORAL | Status: DC

## 2013-03-31 NOTE — Discharge Summary (Signed)
Physician Discharge Summary  Linda Biehn Syler EXB:[id number] DOB: 13-May-1956 DOA: 03/29/2013  PCP: Dema Severin, NP  Admit date: 03/29/2013 Discharge date: 03/31/2013  Time spent: 35 minutes  Recommendations for Outpatient Follow-up:  1. Follow up with PCP in 4-5 days  2. Follow up with Neurology as scheduled  Recommendations for primary care physician for things to follow:  Monitor your symptoms  Discharge Diagnoses:  Principal Problem:   CAP (community acquired pneumonia)  Discharge Condition: stable  Diet recommendation: via PEG  Filed Weights   03/30/13 0500  Weight: 88.5 kg (195 lb 1.7 oz)   History of present illness:  Alexander [name] is a 57 y.o. male with PMH of CNS degenerative disease, who presents to the ED with his family and caregiver. They note that patient has had increasing cough, ill appearing, for the past 2-3 days. Cough was significantly worse tonight with SOB and CP. Cough productive of yellowish sputum. Family concerned he may have PNA as he has had PNA several times in past (been about a year since last PNA per family). In the ED he was noted to have T of 99.8, O2 sats were 93%, WBC 12k, CXR demonstrated bibasilar atelectasis vs infiltrate. Clinical diagnosis of CAP made, and patient started on levaquin, hospitalist asked to admit.  Hospital Course:  Patient was admitted with community acquired pneumonia. He was started on IV Levaquin in the ED and transitioned to oral antibiotics on 9/29. Patient's symptoms have improved with antibiotics and he was breathing comfortably on room air. His leukocytosis has resolved from 12.0 on admission to 9.8. He was afebrile, not tachycardic and normotensive, and was discharged home to complete a 8 total day course of Levofloxacin. All his chronic medical problems seemed stable and he is to resume all his previous home medications and his nutrition via his PEG tube.   Procedures:  none   Consultations:  none  Discharge  Exam: Filed Vitals:   03/30/13 1340 03/30/13 2100 03/30/13 2211 03/31/13 0500  BP: 118/82  110/62 116/79  Pulse: 64  68 64  Temp: 97.9 F (36.6 C) 98 F (36.7 C) 98.3 F (36.8 C) 97.9 F (36.6 C)  TempSrc: Oral  Oral Oral  Resp: 18  18 16   Height:      Weight:      SpO2: 94%  94% 94%   General: NAD Cardiovascular: RRR Respiratory: CTA biL  Discharge Instructions   Future Appointments Provider Department Dept Phone   04/10/2013 11:30 AM Huston Foley, MD [facility] [phone]       Medication List         allopurinol 300 MG tablet  Commonly known as:  ZYLOPRIM  Give 300 mg by tube every morning.     cholecalciferol 1000 UNITS tablet  Commonly known as:  VITAMIN D  Give 1,000 Units by tube 2 (two) times daily.     citalopram 40 MG tablet  Commonly known as:  CELEXA  Give 40 mg by tube daily. Via tube     cyclobenzaprine 10 MG tablet  Commonly known as:  FLEXERIL  Give 1 tablet by tube 3 (three) times daily as needed for muscle spasms.     esomeprazole 40 MG capsule  Commonly known as:  NEXIUM  Give 40 mg by tube daily before breakfast. Via tube     gabapentin 300 MG capsule  Commonly known as:  NEURONTIN  Give 600-900 mg by tube 2 (two) times daily. Take 3 capsules in the  morning and 2 capsules in the evening.     HYDROcodone-acetaminophen 7.5-500 MG per tablet  Commonly known as:  LORTAB  Give 1 tablet by tube every 6 (six) hours as needed for pain. For pain     levofloxacin 750 MG tablet  Commonly known as:  LEVAQUIN  Place 1 tablet (750 mg total) into feeding tube at bedtime.     meloxicam 15 MG tablet  Commonly known as:  MOBIC  Give 1 tablet by tube every morning.     nystatin cream  Commonly known as:  MYCOSTATIN  Apply 1 application topically 2 (two) times daily as needed (irritation of incision).     silver sulfADIAZINE 1 % cream  Commonly known as:  SILVADENE  Apply 1 application topically 2 (two) times daily as needed  (irritation of incision).     topiramate 100 MG tablet  Commonly known as:  TOPAMAX  Give 100-150 mg by tube 2 (two) times daily. Take 1.5 tablets in the morning and 1 tablet in the evening     traZODone 50 MG tablet  Commonly known as:  DESYREL  Give 50 mg by tube at bedtime. Via tube     venlafaxine XR 37.5 MG 24 hr capsule  Commonly known as:  EFFEXOR-XR  Give 37.5 mg by tube every morning.     VIMPAT 100 MG Tabs  Generic drug:  Lacosamide  Give 100 mg by tube 2 (two) times daily.       Follow-up Information   Follow up with Dema Severin, NP. Schedule an appointment as soon as possible for a visit in 1 week.   Contact information:   [address] [place] [address] [phone]       The results of significant diagnostics from this hospitalization (including imaging, microbiology, ancillary and laboratory) are listed below for reference.    Significant Diagnostic Studies: Dg Chest 2 View  03/30/2013   CLINICAL DATA:  Short of breath and chest pain for 2 days. Ex-smoker.  EXAM: CHEST  2 VIEW  COMPARISON:  10/01/2012  FINDINGS: The lateral view is degraded by overlying arms. 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  Lower cervical spine fixation. Midline trachea. Borderline cardiomegaly, accentuated by AP portable technique. No definite pleural fluid. Low lung volumes. Patchy bibasilar opacities are greater on the left than right.  IMPRESSION: Low lung volume frontal radiograph with bibasilar pulmonary opacities. Favor atelectasis. If symptoms warrant, consider PA radiographic followup.  Borderline cardiomegaly, without congestive failure.   Electronically Signed   By: Jeronimo Greaves   On: 03/30/2013 00:36    Microbiology: Recent Results (from the past 240 hour(s))  CULTURE, BLOOD (ROUTINE X 2)     Status: None   Collection Time    03/30/13  3:15 AM      Result Value Range Status   Specimen Description BLOOD LEFT ARM   Final   Special  Requests BOTTLES DRAWN AEROBIC AND ANAEROBIC 4CC   Final   Culture  Setup Time     Final   Value: 03/30/2013 16:58     Performed at [facility]   Culture     Final   Value:        BLOOD CULTURE RECEIVED NO GROWTH TO DATE CULTURE WILL BE HELD FOR 5 DAYS BEFORE ISSUING A FINAL NEGATIVE REPORT     Performed at [facility]   Report Status PENDING   Incomplete  CULTURE, BLOOD (ROUTINE X 2)  Status: None   Collection Time    03/30/13  3:20 AM      Result Value Range Status   Specimen Description BLOOD LEFT HAND   Final   Special Requests BOTTLES DRAWN AEROBIC AND ANAEROBIC 5CC   Final   Culture  Setup Time     Final   Value: 03/30/2013 16:59     Performed at [facility]   Culture     Final   Value:        BLOOD CULTURE RECEIVED NO GROWTH TO DATE CULTURE WILL BE HELD FOR 5 DAYS BEFORE ISSUING A FINAL NEGATIVE REPORT     Performed at [facility]   Report Status PENDING   Incomplete     Labs: Basic Metabolic Panel:  Recent Labs Lab 03/29/13 2355 03/30/13 0007 03/30/13 0320  NA 139 142 138  K 3.9 3.9 3.9  CL 103 107 104  CO2 25  --  25  GLUCOSE 91 91 84  BUN 18 19 18   CREATININE 0.58 0.80 0.54  CALCIUM 9.3  --  9.0   Liver Function Tests:  Recent Labs Lab 03/29/13 2355  AST 18  ALT 15  ALKPHOS 87  BILITOT 0.2*  PROT 7.1  ALBUMIN 3.8   CBC:  Recent Labs Lab 03/29/13 2355 03/30/13 0007 03/30/13 0320  WBC 12.0*  --  9.8  HGB 15.2 16.0 14.4  HCT 46.3 47.0 44.1  MCV 96.1  --  95.9  PLT 168  --  163   Cardiac Enzymes:  Recent Labs Lab 03/29/13 2355  TROPONINI <0.30   CBG:  Recent Labs Lab 03/30/13 0259 03/30/13 0742 03/30/13 1122 03/30/13 1709  GLUCAP 96 86 96 85    Signed:  GHERGHE, COSTIN  [facility] 03/31/2013, 11:07 AM

## 2013-03-31 NOTE — Discharge Summary (Signed)
D/ced home with mother.

## 2013-04-05 LAB — CULTURE, BLOOD (ROUTINE X 2)
Culture: NO GROWTH
Culture: NO GROWTH

## 2013-04-07 ENCOUNTER — Other Ambulatory Visit: Payer: Self-pay | Admitting: Internal Medicine

## 2013-04-07 NOTE — Telephone Encounter (Signed)
Medication refill-levoquin

## 2013-04-08 ENCOUNTER — Other Ambulatory Visit: Payer: Self-pay | Admitting: Internal Medicine

## 2013-04-09 ENCOUNTER — Other Ambulatory Visit: Payer: Self-pay | Admitting: Internal Medicine

## 2013-04-10 ENCOUNTER — Encounter: Payer: Self-pay | Admitting: Neurology

## 2013-04-10 ENCOUNTER — Ambulatory Visit (INDEPENDENT_AMBULATORY_CARE_PROVIDER_SITE_OTHER): Payer: Medicare Other | Admitting: Neurology

## 2013-04-10 VITALS — BP 99/70 | HR 102 | Temp 98.1°F

## 2013-04-10 DIAGNOSIS — J189 Pneumonia, unspecified organism: Secondary | ICD-10-CM

## 2013-04-10 DIAGNOSIS — S12000S Unspecified displaced fracture of first cervical vertebra, sequela: Secondary | ICD-10-CM

## 2013-04-10 DIAGNOSIS — G40909 Epilepsy, unspecified, not intractable, without status epilepticus: Secondary | ICD-10-CM

## 2013-04-10 DIAGNOSIS — R1310 Dysphagia, unspecified: Secondary | ICD-10-CM

## 2013-04-10 DIAGNOSIS — M4712 Other spondylosis with myelopathy, cervical region: Secondary | ICD-10-CM

## 2013-04-10 DIAGNOSIS — G959 Disease of spinal cord, unspecified: Secondary | ICD-10-CM

## 2013-04-10 DIAGNOSIS — F329 Major depressive disorder, single episode, unspecified: Secondary | ICD-10-CM

## 2013-04-10 DIAGNOSIS — IMO0002 Reserved for concepts with insufficient information to code with codable children: Secondary | ICD-10-CM

## 2013-04-10 DIAGNOSIS — R471 Dysarthria and anarthria: Secondary | ICD-10-CM

## 2013-04-10 NOTE — Patient Instructions (Signed)
Continue current medications.  Use your G tube for feeding and medications.  Make an appointment with your PCP and follow up with me in 6 months.
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
## 2013-05-08 ENCOUNTER — Other Ambulatory Visit: Payer: Self-pay

## 2013-05-21 ENCOUNTER — Emergency Department (HOSPITAL_COMMUNITY): Payer: Medicare Other

## 2013-05-21 ENCOUNTER — Emergency Department (HOSPITAL_COMMUNITY)
Admission: EM | Admit: 2013-05-21 | Discharge: 2013-05-21 | Disposition: A | Discharge status: Home / Self Care | Payer: Medicare Other | Attending: Emergency Medicine | Admitting: Emergency Medicine

## 2013-05-21 ENCOUNTER — Encounter (HOSPITAL_COMMUNITY): Payer: Self-pay | Admitting: Emergency Medicine

## 2013-05-21 DIAGNOSIS — I10 Essential (primary) hypertension: Secondary | ICD-10-CM | POA: Insufficient documentation

## 2013-05-21 DIAGNOSIS — Z791 Long term (current) use of non-steroidal anti-inflammatories (NSAID): Secondary | ICD-10-CM | POA: Insufficient documentation

## 2013-05-21 DIAGNOSIS — F329 Major depressive disorder, single episode, unspecified: Secondary | ICD-10-CM | POA: Insufficient documentation

## 2013-05-21 DIAGNOSIS — Y833 Surgical operation with formation of external stoma as the cause of abnormal reaction of the patient, or of later complication, without mention of misadventure at the time of the procedure: Secondary | ICD-10-CM | POA: Insufficient documentation

## 2013-05-21 DIAGNOSIS — Z8739 Personal history of other diseases of the musculoskeletal system and connective tissue: Secondary | ICD-10-CM | POA: Insufficient documentation

## 2013-05-21 DIAGNOSIS — T85598A Other mechanical complication of other gastrointestinal prosthetic devices, implants and grafts, initial encounter: Secondary | ICD-10-CM

## 2013-05-21 DIAGNOSIS — E119 Type 2 diabetes mellitus without complications: Secondary | ICD-10-CM | POA: Insufficient documentation

## 2013-05-21 DIAGNOSIS — F3289 Other specified depressive episodes: Secondary | ICD-10-CM | POA: Insufficient documentation

## 2013-05-21 DIAGNOSIS — Z79899 Other long term (current) drug therapy: Secondary | ICD-10-CM | POA: Insufficient documentation

## 2013-05-21 DIAGNOSIS — G40909 Epilepsy, unspecified, not intractable, without status epilepticus: Secondary | ICD-10-CM | POA: Insufficient documentation

## 2013-05-21 DIAGNOSIS — K9423 Gastrostomy malfunction: Secondary | ICD-10-CM | POA: Insufficient documentation

## 2013-05-21 DIAGNOSIS — M109 Gout, unspecified: Secondary | ICD-10-CM | POA: Insufficient documentation

## 2013-05-21 DIAGNOSIS — Z87891 Personal history of nicotine dependence: Secondary | ICD-10-CM | POA: Insufficient documentation

## 2013-05-21 NOTE — ED Provider Notes (Signed)
CSN: [id number]     Arrival date & time 05/21/13  1008 History   First MD Initiated Contact with Patient 05/21/13 1016     Chief complaint: Needs feeding tube replaced  HPI The patient presents to the emergency room to have his feeding tube replaced. The wife states that the caps on The Feeding Tube Have Broken off and Now Fluid Leaks Out Of the Feeding Tube. Patient's not having any acute complaints. Wife called her doctor and was told to come to the emergency department. He uses the feeding tube daily for enteral feeding.  No vomiting no fever no abdominal pain. Past Medical History  Diagnosis Date  . Diabetes mellitus   . Hypertension   . Seizure   . Hypercholesterolemia   . CNS degenerative disease   . Headache(784.0)   . Arthritis   . Gout   . Low back pain radiating to left leg   . Keratoconus   . Supranuclear palsy   . Cervical myelopathy 10/09/2012  . C1 cervical fracture 10/09/2012  . Depression 10/09/2012   Past Surgical History  Procedure Laterality Date  . Fracture surgery    . Peg tube placement  2008  . Left shoulder surgery  2002  . Corneal implant  2003  . Cholecystectomy N/A February, 2014   No family history on file. History  Substance Use Topics  . Smoking status: Former Games developer  . Smokeless tobacco: Not on file  . Alcohol Use: No    Review of Systems  Constitutional: Negative for fever.  Respiratory: Negative for shortness of breath.   Gastrointestinal: Negative for abdominal pain.  All other systems reviewed and are negative.    Allergies  Review of patient's allergies indicates no known allergies.  Home Medications   Current Outpatient Rx  Name  Route  Sig  Dispense  Refill  . allopurinol (ZYLOPRIM) 300 MG tablet   Tube   Give 300 mg by tube every morning.         . cholecalciferol (VITAMIN D) 1000 UNITS tablet   Tube   Give 1,000 Units by tube 2 (two) times daily.          . citalopram (CELEXA) 40 MG tablet   Tube   Give 40 mg by  tube daily. Via tube         . cyclobenzaprine (FLEXERIL) 10 MG tablet   Tube   Give 1 tablet by tube 3 (three) times daily as needed for muscle spasms.          Marland Kitchen esomeprazole (NEXIUM) 40 MG capsule   Tube   Give 40 mg by tube daily before breakfast. Via tube         . gabapentin (NEURONTIN) 300 MG capsule   Tube   Give 600-900 mg by tube 2 (two) times daily. Take 3 capsules in the morning and 2 capsules in the evening.         Marland Kitchen HYDROcodone-acetaminophen (LORTAB) 7.5-500 MG per tablet   Tube   Give 1 tablet by tube every 6 (six) hours as needed for pain. For pain         . Lacosamide (VIMPAT) 100 MG TABS   Tube   Give 100 mg by tube 2 (two) times daily.         Marland Kitchen levofloxacin (LEVAQUIN) 750 MG tablet   Per Tube   Place 1 tablet (750 mg total) into feeding tube at bedtime.   6 tablet   0   .  meloxicam (MOBIC) 15 MG tablet   Tube   Give 1 tablet by tube every morning.          . nystatin cream (MYCOSTATIN)   Topical   Apply 1 application topically 2 (two) times daily as needed (irritation of incision).          . silver sulfADIAZINE (SILVADENE) 1 % cream   Topical   Apply 1 application topically 2 (two) times daily as needed (irritation of incision).          . topiramate (TOPAMAX) 100 MG tablet   Tube   Give 100-150 mg by tube 2 (two) times daily. Take 1.5 tablets in the morning and 1 tablet in the evening         . traZODone (DESYREL) 50 MG tablet   Tube   Give 50 mg by tube at bedtime. Via tube         . venlafaxine XR (EFFEXOR-XR) 37.5 MG 24 hr capsule   Tube   Give 37.5 mg by tube every morning.           BP 111/74  Pulse 66  Temp(Src) 98.4 F (36.9 C) (Oral)  Resp 18  SpO2 95% Physical Exam  Nursing note and vitals reviewed. Constitutional: He appears well-developed and well-nourished. No distress.  HENT:  Head: Normocephalic and atraumatic.  Right Ear: External ear normal.  Left Ear: External ear normal.  Eyes:  Conjunctivae are normal. Right eye exhibits no discharge. Left eye exhibits no discharge. No scleral icterus.  Neck: Neck supple. No tracheal deviation present.  Cardiovascular: Normal rate.   Pulmonary/Chest: Effort normal. No stridor. No respiratory distress.  Abdominal: He exhibits no ascites and no mass. There is no tenderness. There is no rebound. No hernia.  Feeding tube ostomy without signs of erythema, the family has taped around one of the ports  Musculoskeletal: He exhibits no edema.  Neurological: He is alert.  Skin: Skin is warm and dry. No rash noted.  Psychiatric: He has a normal mood and affect.    ED Course  Procedures (including critical care time) I attempted to deflate the balloon on the patient's broken feeding tube.  The balloon will not deflate completely and am unable to extract the feeding tube. I did place a needle in the balloon port to remove the fluid that way but was only able to remove approximately 3 cc of fluid.  The tubes still not withdraw  Labs Review Labs Reviewed - No data to display Imaging Review No results found.  EKG Interpretation   None      Unable to successfully replace the feeding tube in emergency department. I will consult with interventional radiology for tube placement. MDM   1. Feeding tube dysfunction, initial encounter    Feeding tube replaced by IR    Celene Kras, MD 05/21/13 [phone]

## 2013-05-21 NOTE — ED Notes (Signed)
Pt returned from IR with RN

## 2013-05-21 NOTE — ED Notes (Signed)
Pt's wife states that 2-3 days ago pt's 2 caps on feeding tubes have been broke off and they are having to tape the feeding tube from leaking out.

## 2013-05-21 NOTE — Procedures (Signed)
Successful exchange of LUQ balloon retention gastrostomy tube. Pt now has 22Fr No complications.  Brayton El PA-C Interventional Radiology 05/21/2013 1:44 PM

## 2013-05-21 NOTE — ED Notes (Signed)
Pt transported to IR with tech.

## 2013-05-30 ENCOUNTER — Other Ambulatory Visit: Payer: Self-pay | Admitting: Neurology

## 2013-06-02 ENCOUNTER — Other Ambulatory Visit: Payer: Self-pay | Admitting: Neurology

## 2013-06-03 NOTE — Telephone Encounter (Signed)
Patient's prescription was faxed over to [facility] at [phone]. °

## 2013-06-03 NOTE — Telephone Encounter (Signed)
Patient's prescription was already printed out dispensing 60 tablets with 5 refills that Dr. Frances Furbish signed and printed out.

## 2013-06-07 ENCOUNTER — Other Ambulatory Visit: Payer: Self-pay | Admitting: Neurology

## 2013-06-07 NOTE — Telephone Encounter (Signed)
Former [facility] patient, he has been prescribing this med for quite some time.  Last OV with Dr A said no changed in med suggested.

## 2013-06-11 ENCOUNTER — Encounter (HOSPITAL_COMMUNITY): Payer: Self-pay | Admitting: Emergency Medicine

## 2013-06-11 ENCOUNTER — Other Ambulatory Visit: Payer: Self-pay | Admitting: Neurology

## 2013-06-11 ENCOUNTER — Emergency Department (HOSPITAL_COMMUNITY): Payer: Medicare Other

## 2013-06-11 ENCOUNTER — Inpatient Hospital Stay (HOSPITAL_COMMUNITY)
Admission: EM | Admit: 2013-06-11 | Discharge: 2013-06-13 | DRG: 195 | Disposition: A | Discharge status: Home / Self Care | Payer: Medicare Other | Attending: Internal Medicine | Admitting: Internal Medicine

## 2013-06-11 DIAGNOSIS — E7800 Pure hypercholesterolemia, unspecified: Secondary | ICD-10-CM | POA: Diagnosis present

## 2013-06-11 DIAGNOSIS — K5900 Constipation, unspecified: Secondary | ICD-10-CM | POA: Diagnosis present

## 2013-06-11 DIAGNOSIS — E119 Type 2 diabetes mellitus without complications: Secondary | ICD-10-CM | POA: Diagnosis present

## 2013-06-11 DIAGNOSIS — J189 Pneumonia, unspecified organism: Principal | ICD-10-CM | POA: Diagnosis present

## 2013-06-11 DIAGNOSIS — Z87891 Personal history of nicotine dependence: Secondary | ICD-10-CM

## 2013-06-11 DIAGNOSIS — I10 Essential (primary) hypertension: Secondary | ICD-10-CM | POA: Diagnosis present

## 2013-06-11 DIAGNOSIS — Z79899 Other long term (current) drug therapy: Secondary | ICD-10-CM

## 2013-06-11 DIAGNOSIS — Z8673 Personal history of transient ischemic attack (TIA), and cerebral infarction without residual deficits: Secondary | ICD-10-CM

## 2013-06-11 DIAGNOSIS — F329 Major depressive disorder, single episode, unspecified: Secondary | ICD-10-CM

## 2013-06-11 HISTORY — DX: Cerebral infarction, unspecified: I63.9

## 2013-06-11 HISTORY — DX: Pneumonitis due to inhalation of food and vomit: J69.0

## 2013-06-11 HISTORY — DX: Pneumonia, unspecified organism: J18.9

## 2013-06-11 HISTORY — DX: Unspecified intracranial injury with loss of consciousness of unspecified duration, initial encounter: S06.9X9A

## 2013-06-11 HISTORY — DX: Polyneuropathy, unspecified: G62.9

## 2013-06-11 LAB — COMPREHENSIVE METABOLIC PANEL
ALT: 19 U/L (ref 0–53)
BUN: 13 mg/dL (ref 6–23)
CO2: 29 mEq/L (ref 19–32)
Calcium: 9.6 mg/dL (ref 8.4–10.5)
Creatinine, Ser: 0.57 mg/dL (ref 0.50–1.35)
GFR calc Af Amer: >90 mL/min (ref >90)
GFR calc non Af Amer: >90 mL/min (ref >90)
Glucose, Bld: 100 mg/dL — ABNORMAL HIGH (ref 70–99)
Sodium: 140 mEq/L (ref 135–145)
Total Bilirubin: 0.3 mg/dL (ref 0.3–1.2)
Total Protein: 7.8 g/dL (ref 6.0–8.3)

## 2013-06-11 LAB — CBC WITH DIFFERENTIAL/PLATELET
Eosinophils Absolute: 0.4 10*3/uL (ref 0.0–0.7)
Eosinophils Relative: 5 % (ref 0–5)
Lymphs Abs: 2.7 10*3/uL (ref 0.7–4.0)
MCH: 31.7 pg (ref 26.0–34.0)
MCHC: 33 g/dL (ref 30.0–36.0)
MCV: 96.2 fL (ref 78.0–100.0)
Monocytes Absolute: 0.8 10*3/uL (ref 0.1–1.0)
Neutrophils Relative %: 54 % (ref 43–77)
Platelets: 163 10*3/uL (ref 150–400)
RBC: 5.26 MIL/uL (ref 4.22–5.81)

## 2013-06-11 LAB — URINALYSIS, ROUTINE W REFLEX MICROSCOPIC
Nitrite: NEGATIVE
Protein, ur: NEGATIVE mg/dL
Specific Gravity, Urine: 1.012 (ref 1.005–1.030)
Urobilinogen, UA: 0.2 mg/dL (ref 0.0–1.0)

## 2013-06-11 LAB — LIPASE, BLOOD: Lipase: 23 U/L (ref 11–59)

## 2013-06-11 LAB — AMMONIA: Ammonia: 40 umol/L (ref 11–60)

## 2013-06-11 LAB — CG4 I-STAT (LACTIC ACID): Lactic Acid, Venous: 0.82 mmol/L (ref 0.5–2.2)

## 2013-06-11 MED ORDER — PIPERACILLIN-TAZOBACTAM 3.375 G IVPB
3.3750 g | Freq: Once | INTRAVENOUS | Status: AC
  Administered 2013-06-11: 3.375 g via INTRAVENOUS
  Filled 2013-06-11: qty 50

## 2013-06-11 MED ORDER — VANCOMYCIN HCL 10 G IV SOLR
1250.0000 mg | Freq: Once | INTRAVENOUS | Status: AC
  Administered 2013-06-12: 1250 mg via INTRAVENOUS
  Filled 2013-06-11: qty 1250

## 2013-06-11 NOTE — ED Notes (Signed)
Presents with no BM for 5 days abdominal swelling and pain, tremors, weakness and wet/gurgling voice. HX of several strokes, has g-tube and is supposed to be NPO, but does not follow all the time, also has a muscular disease, wheelchair bound and totally dependant. Per caretaker, pt is not acting normally, weaker and has a change in his voice that occurred 3 days ago. Pt points to abdomen when asked where he hurts. Will answer yes and no questions with thumbs up and thumbs down, otherwise very difficult to decipher speech. No facial droop or weaknss to one side of the body.

## 2013-06-11 NOTE — ED Provider Notes (Signed)
CSN: [id number]     Arrival date & time 06/11/13  1831 History   First MD Initiated Contact with Patient 06/11/13 1944     Chief Complaint  Patient presents with  . Abdominal Pain   (Consider location/radiation/quality/duration/timing/severity/associated sxs/prior Treatment) HPI Comments: 57 year old male with a history of multiple strokes, and CNS degenerative disease presents with tremors, intermittent confusion, and constipation. Because patient at least 5 days. Patient also describing abdominal pain during this time and swelling. He's also had some "gurgling in his voice". Patient not had any fevers or chills does not seem to be any respiratory distress per the caregiver. The history is unobtainable from the patient due to his nonverbal status. There's been no vomiting or abnormal output of the G-tube. Patient does sometimes take oral food and liquid and is not supposed to.   Past Medical History  Diagnosis Date  . Diabetes mellitus   . Hypertension   . Seizure   . Hypercholesterolemia   . CNS degenerative disease   . Headache(784.0)   . Arthritis   . Gout   . Low back pain radiating to left leg   . Keratoconus   . Supranuclear palsy   . Cervical myelopathy 10/09/2012  . C1 cervical fracture 10/09/2012  . Depression 10/09/2012  . CVA (cerebral infarction)    Past Surgical History  Procedure Laterality Date  . Fracture surgery    . Peg tube placement  2008  . Left shoulder surgery  2002  . Corneal implant  2003  . Cholecystectomy N/A February, 2014   History reviewed. No pertinent family history. History  Substance Use Topics  . Smoking status: Former Games developer  . Smokeless tobacco: Not on file  . Alcohol Use: No    Review of Systems  Unable to perform ROS: Dementia  Gastrointestinal: Positive for abdominal pain.    Allergies  Review of patient's allergies indicates no known allergies.  Home Medications   Current Outpatient Rx  Name  Route  Sig  Dispense  Refill  .  allopurinol (ZYLOPRIM) 300 MG tablet   Tube   Give 300 mg by tube daily.         . cholecalciferol (VITAMIN D) 1000 UNITS tablet   Tube   Give 1,000 Units by tube 2 (two) times daily.          . citalopram (CELEXA) 40 MG tablet   Tube   Give 40 mg by tube daily.          . cyclobenzaprine (FLEXERIL) 10 MG tablet   Tube   Give 1 tablet by tube 3 (three) times daily as needed for muscle spasms.          Marland Kitchen esomeprazole (NEXIUM) 40 MG capsule   Tube   Give 40 mg by tube daily before breakfast.          . gabapentin (NEURONTIN) 300 MG capsule   Tube   Give 600-900 mg by tube 2 (two) times daily. Take 3 capsules in the morning and 2 capsules in the evening.         Marland Kitchen HYDROcodone-acetaminophen (LORTAB) 7.5-500 MG per tablet   Tube   Give 1 tablet by tube every 6 (six) hours as needed for pain. For pain         . Lacosamide (VIMPAT) 100 MG TABS   Tube   Give 100 mg by tube 2 (two) times daily.          . meloxicam (MOBIC) 15 MG  tablet   Tube   Give 1 tablet by tube every morning.          . nystatin cream (MYCOSTATIN)   Topical   Apply 1 application topically 2 (two) times daily as needed (irritation of incision).          . silver sulfADIAZINE (SILVADENE) 1 % cream   Topical   Apply 1 application topically 2 (two) times daily as needed (irritation of incision).          Marland Kitchen tetrahydrozoline (VISINE) 0.05 % ophthalmic solution   Both Eyes   Place 2 drops into both eyes daily as needed (for dry eyes).         . topiramate (TOPAMAX) 100 MG tablet   Tube   Give 100-150 mg by tube 2 (two) times daily. Take 1.5 tablets in the morning and 1 tablet in the evening         . traZODone (DESYREL) 50 MG tablet   Tube   Give 50 mg by tube at bedtime.          Marland Kitchen venlafaxine XR (EFFEXOR-XR) 37.5 MG 24 hr capsule   Tube   Give 37.5 mg by tube daily with breakfast.         . magnesium citrate SOLN   Tube   Give 1 Bottle by tube once.           BP  113/94  Pulse 94  Temp(Src) 98.5 F (36.9 C) (Oral)  Resp 20  SpO2 96% Physical Exam  Nursing note and vitals reviewed. Constitutional: He appears well-developed and well-nourished.  HENT:  Head: Normocephalic and atraumatic.  Right Ear: External ear normal.  Left Ear: External ear normal.  Nose: Nose normal.  Eyes: Pupils are equal, round, and reactive to light. Right eye exhibits no discharge. Left eye exhibits no discharge.  Neck: Neck supple.  Cardiovascular: Normal rate, regular rhythm, normal heart sounds and intact distal pulses.   Pulmonary/Chest: Effort normal. He has no wheezes. He has rales.  Abdominal: Soft. There is tenderness (diffuse).  Musculoskeletal: He exhibits no edema.  Neurological: He is alert. He is disoriented.  Skin: Skin is warm and dry.    ED Course  Procedures (including critical care time) Labs Review Labs Reviewed  COMPREHENSIVE METABOLIC PANEL - Abnormal; Notable for the following:    Glucose, Bld 100 (*)    All other components within normal limits  CULTURE, BLOOD (ROUTINE X 2)  CULTURE, BLOOD (ROUTINE X 2)  CULTURE, EXPECTORATED SPUTUM-ASSESSMENT  GRAM STAIN  CBC WITH DIFFERENTIAL  LIPASE, BLOOD  URINALYSIS, ROUTINE W REFLEX MICROSCOPIC  AMMONIA  CBC  CREATININE, SERUM  CBC  BASIC METABOLIC PANEL  CG4 I-STAT (LACTIC ACID)   Imaging Review Dg Chest 2 View  06/11/2013   CLINICAL DATA:  Seizures; hypertension  EXAM: CHEST  2 VIEW  COMPARISON:  March 30, 2013  FINDINGS: There is a small area of patchy infiltrate in the right base. Lungs are otherwise clear. Heart size and pulmonary vascularity are normal. No adenopathy.  There is evidence of an old fracture of the left clavicle with resort shin of the lateral left clavicle. There is postoperative change in the cervical spine.  IMPRESSION: Patchy infiltrate left base.   Electronically Signed   By: Bretta Bang M.D.   On: 06/11/2013 20:41   Dg Abd 1 View  06/11/2013   CLINICAL  DATA:  Entire abdominal pain. Distention. The patient has a G-tube  EXAM: ABDOMEN - 1  VIEW  COMPARISON:  10/18/2012  FINDINGS: Gastrostomy tube is identified in the left upper quadrant of the abdomen. There is moderate stool within the ascending and rectosigmoid colon. No evidence for small bowel dilatation. Degenerative changes are seen in the lumbar spine and hips. No free intraperitoneal air identified.  IMPRESSION: 1. Left upper quadrant gastrostomy tube identified. 2. Moderate stool burden. 3. Nonobstructive bowel gas pattern.   Electronically Signed   By: Rosalie Gums M.D.   On: 06/11/2013 20:49    EKG Interpretation    Date/Time:    Ventricular Rate:    PR Interval:    QRS Duration:   QT Interval:    QTC Calculation:   R Axis:     Text Interpretation:              MDM   1. HCAP (healthcare-associated pneumonia)   2. Constipation    Abd is soft but diffuse, mild tenderness. Given amount of stool on Xray and history, this is felt to be the likely cause. CXR c/w PNA, and given his "gurgling" he is likely trying to cough. Airway stable at this point, no excess secretions or distress. Given his comorbidities will admit to hospitalist for IV abx.    Audree Camel, MD 06/12/13 [phone]

## 2013-06-11 NOTE — ED Notes (Signed)
MD at bedside. 

## 2013-06-11 NOTE — ED Notes (Signed)
Dr. Goldston at bedside.  

## 2013-06-12 ENCOUNTER — Encounter (HOSPITAL_COMMUNITY): Payer: Self-pay | Admitting: *Deleted

## 2013-06-12 DIAGNOSIS — K5900 Constipation, unspecified: Secondary | ICD-10-CM | POA: Insufficient documentation

## 2013-06-12 DIAGNOSIS — J189 Pneumonia, unspecified organism: Principal | ICD-10-CM

## 2013-06-12 LAB — GLUCOSE, CAPILLARY
Glucose-Capillary: 95 mg/dL (ref 70–99)
Glucose-Capillary: 94 mg/dL (ref 70–99)

## 2013-06-12 LAB — CBC
MCHC: 32.6 g/dL (ref 30.0–36.0)
MCV: 96.9 fL (ref 78.0–100.0)
Platelets: 138 10*3/uL — ABNORMAL LOW (ref 150–400)
RBC: 4.9 MIL/uL (ref 4.22–5.81)
WBC: 8.4 10*3/uL (ref 4.0–10.5)

## 2013-06-12 LAB — BASIC METABOLIC PANEL
BUN: 14 mg/dL (ref 6–23)
CO2: 29 mEq/L (ref 19–32)
Calcium: 9.1 mg/dL (ref 8.4–10.5)
Chloride: 103 mEq/L (ref 96–112)
Creatinine, Ser: 0.56 mg/dL (ref 0.50–1.35)
Glucose, Bld: 98 mg/dL (ref 70–99)
Sodium: 139 mEq/L (ref 135–145)

## 2013-06-12 MED ORDER — CYCLOBENZAPRINE HCL 10 MG PO TABS: 10.0000 mg | ORAL_TABLET | Freq: Three times a day (TID) | ORAL | Status: DC | PRN

## 2013-06-12 MED ORDER — JEVITY 1.2 CAL PO LIQD
1000.0000 mL | ORAL | Status: DC
  Filled 2013-06-12 (×3): qty 1000

## 2013-06-12 MED ORDER — NAPHAZOLINE HCL 0.1 % OP SOLN
2.0000 [drp] | Freq: Four times a day (QID) | OPHTHALMIC | Status: DC | PRN
  Filled 2013-06-12: qty 15

## 2013-06-12 MED ORDER — VENLAFAXINE HCL ER 37.5 MG PO CP24
37.5000 mg | ORAL_CAPSULE | Freq: Every day | ORAL | Status: DC
  Administered 2013-06-12 – 2013-06-13 (×2): 37.5 mg via ORAL
  Filled 2013-06-12 (×5): qty 1

## 2013-06-12 MED ORDER — GABAPENTIN 300 MG PO CAPS: 600.0000 mg | ORAL_CAPSULE | Freq: Two times a day (BID) | ORAL | Status: DC

## 2013-06-12 MED ORDER — FREE WATER
250.0000 mL | Freq: Four times a day (QID) | Status: DC
  Administered 2013-06-12: 250 mL

## 2013-06-12 MED ORDER — ALLOPURINOL 300 MG PO TABS
300.0000 mg | ORAL_TABLET | Freq: Every day | ORAL | Status: DC
  Administered 2013-06-12 – 2013-06-13 (×2): 300 mg
  Filled 2013-06-12 (×2): qty 1

## 2013-06-12 MED ORDER — LACOSAMIDE 50 MG PO TABS
100.0000 mg | ORAL_TABLET | Freq: Two times a day (BID) | ORAL | Status: DC
  Administered 2013-06-12 – 2013-06-13 (×3): 100 mg
  Filled 2013-06-12 (×5): qty 2

## 2013-06-12 MED ORDER — TOPIRAMATE 25 MG PO TABS: 100.0000 mg | ORAL_TABLET | Freq: Two times a day (BID) | ORAL | Status: DC

## 2013-06-12 MED ORDER — SILVER SULFADIAZINE 1 % EX CREA: 1.0000 "application " | TOPICAL_CREAM | Freq: Two times a day (BID) | CUTANEOUS | Status: DC | PRN

## 2013-06-12 MED ORDER — NON FORMULARY: Status: DC

## 2013-06-12 MED ORDER — TRAZODONE HCL 50 MG PO TABS
50.0000 mg | ORAL_TABLET | Freq: Every day | ORAL | Status: DC
  Administered 2013-06-12 (×2): 50 mg
  Filled 2013-06-12 (×3): qty 1

## 2013-06-12 MED ORDER — CITALOPRAM HYDROBROMIDE 40 MG PO TABS
40.0000 mg | ORAL_TABLET | Freq: Every day | ORAL | Status: DC
  Administered 2013-06-12 – 2013-06-13 (×2): 40 mg
  Filled 2013-06-12 (×2): qty 1

## 2013-06-12 MED ORDER — HEPARIN SODIUM (PORCINE) 5000 UNIT/ML IJ SOLN
5000.0000 [IU] | Freq: Three times a day (TID) | INTRAMUSCULAR | Status: DC
  Administered 2013-06-12 – 2013-06-13 (×4): 5000 [IU] via SUBCUTANEOUS
  Filled 2013-06-12 (×7): qty 1

## 2013-06-12 MED ORDER — JEVITY 1.5 CAL PO LIQD
1000.0000 mL | ORAL | Status: DC
  Filled 2013-06-12 (×5): qty 1000

## 2013-06-12 MED ORDER — VANCOMYCIN HCL IN DEXTROSE 1-5 GM/200ML-% IV SOLN
1000.0000 mg | Freq: Three times a day (TID) | INTRAVENOUS | Status: DC
  Administered 2013-06-12 – 2013-06-13 (×4): 1000 mg via INTRAVENOUS
  Filled 2013-06-12 (×5): qty 200

## 2013-06-12 MED ORDER — MAGNESIUM CITRATE PO SOLN
0.5000 | Freq: Once | ORAL | Status: AC
  Administered 2013-06-12: 02:00:00 0.5
  Filled 2013-06-12: qty 296

## 2013-06-12 MED ORDER — PANTOPRAZOLE SODIUM 40 MG PO PACK
80.0000 mg | PACK | Freq: Every day | ORAL | Status: DC
  Administered 2013-06-12 – 2013-06-13 (×2): 80 mg
  Filled 2013-06-12 (×2): qty 40

## 2013-06-12 MED ORDER — PANTOPRAZOLE SODIUM 40 MG PO TBEC: 80.0000 mg | DELAYED_RELEASE_TABLET | Freq: Every day | ORAL | Status: DC

## 2013-06-12 MED ORDER — GABAPENTIN 300 MG PO CAPS
900.0000 mg | ORAL_CAPSULE | Freq: Every day | ORAL | Status: DC
  Administered 2013-06-12 – 2013-06-13 (×2): 900 mg via ORAL
  Filled 2013-06-12 (×2): qty 3

## 2013-06-12 MED ORDER — SODIUM CHLORIDE 0.9 % IV SOLN
INTRAVENOUS | Status: DC
  Administered 2013-06-12: 03:00:00 via INTRAVENOUS

## 2013-06-12 MED ORDER — TOPIRAMATE 100 MG PO TABS
100.0000 mg | ORAL_TABLET | Freq: Every day | ORAL | Status: DC
  Administered 2013-06-12: 22:00:00 100 mg via ORAL
  Filled 2013-06-12 (×2): qty 1

## 2013-06-12 MED ORDER — TOPIRAMATE 25 MG PO TABS
150.0000 mg | ORAL_TABLET | Freq: Every day | ORAL | Status: DC
  Administered 2013-06-12 – 2013-06-13 (×2): 150 mg via ORAL
  Filled 2013-06-12 (×2): qty 2

## 2013-06-12 MED ORDER — BIOTENE DRY MOUTH MT LIQD
15.0000 mL | Freq: Two times a day (BID) | OROMUCOSAL | Status: DC
  Administered 2013-06-12 (×2): 15 mL via OROMUCOSAL

## 2013-06-12 MED ORDER — MELOXICAM 15 MG PO TABS
15.0000 mg | ORAL_TABLET | Freq: Every morning | ORAL | Status: DC
  Administered 2013-06-12 – 2013-06-13 (×2): 15 mg
  Filled 2013-06-12 (×2): qty 1

## 2013-06-12 MED ORDER — HYDROCODONE-ACETAMINOPHEN 7.5-325 MG/15ML PO SOLN
15.0000 mL | Freq: Four times a day (QID) | ORAL | Status: DC | PRN
  Administered 2013-06-12 – 2013-06-13 (×2): 15 mL
  Filled 2013-06-12 (×3): qty 15

## 2013-06-12 MED ORDER — NYSTATIN 100000 UNIT/GM EX CREA
1.0000 "application " | TOPICAL_CREAM | Freq: Two times a day (BID) | CUTANEOUS | Status: DC | PRN
  Filled 2013-06-12: qty 15

## 2013-06-12 MED ORDER — PEG 3350-KCL-NA BICARB-NACL 420 G PO SOLR
4000.0000 mL | Freq: Once | ORAL | Status: AC
  Administered 2013-06-12: 14:00:00 4000 mL
  Filled 2013-06-12: qty 4000

## 2013-06-12 MED ORDER — GABAPENTIN 300 MG PO CAPS
600.0000 mg | ORAL_CAPSULE | Freq: Every day | ORAL | Status: DC
  Administered 2013-06-12: 22:00:00 600 mg via ORAL
  Filled 2013-06-12 (×2): qty 2

## 2013-06-12 MED ORDER — DEXTROSE 5 % IV SOLN
1.0000 g | Freq: Three times a day (TID) | INTRAVENOUS | Status: DC
  Administered 2013-06-12 – 2013-06-13 (×4): 1 g via INTRAVENOUS
  Filled 2013-06-12 (×6): qty 1

## 2013-06-12 NOTE — H&P (Signed)
[facility] History and Physical  Alexander [name] UEA:[id number] DOB: 1955-10-27 DOA: 06/11/2013  Referring physician: EDP PCP: Dema Severin, NP   Chief Complaint: Fatigue, cough, fever   HPI: Alexander [name] is a 57 y.o. male known to me from previous admission.  The patient has a history of a progressive neuro degernative disease which has left him among other things with dysphagia.  He takes most of his food via PEG tube but fully admits he is not always complaint with this and will still at times attempt to take PO food.  Unfortunately due to either this or his general debility from the CNS disease he does get frequent pneumonias.  Patient has had a change in his voice and more wet sounding breath sounds onset 3 days ago, and progressively worsening.  Work up in the ED demonstrates LLL infiltrate, and a Tmax of 100.0.  Review of Systems: Systems reviewed.  As above, otherwise negative  Past Medical History  Diagnosis Date  . Diabetes mellitus   . Hypertension   . Seizure   . Hypercholesterolemia   . CNS degenerative disease   . Headache(784.0)   . Arthritis   . Gout   . Low back pain radiating to left leg   . Keratoconus   . Supranuclear palsy   . Cervical myelopathy 10/09/2012  . C1 cervical fracture 10/09/2012  . Depression 10/09/2012  . CVA (cerebral infarction)    Past Surgical History  Procedure Laterality Date  . Fracture surgery    . Peg tube placement  2008  . Left shoulder surgery  2002  . Corneal implant  2003  . Cholecystectomy N/A February, 2014   Social History:  reports that he has quit smoking. He does not have any smokeless tobacco history on file. He reports that he does not drink alcohol or use illicit drugs.  No Known Allergies  History reviewed. No pertinent family history.   Prior to Admission medications   Medication Sig Start Date End Date Taking? Authorizing Provider  allopurinol (ZYLOPRIM) 300 MG tablet Give 300 mg by tube daily.    Yes Historical Provider, MD  cholecalciferol (VITAMIN D) 1000 UNITS tablet Give 1,000 Units by tube 2 (two) times daily.    Yes Historical Provider, MD  citalopram (CELEXA) 40 MG tablet Give 40 mg by tube daily.    Yes Historical Provider, MD  cyclobenzaprine (FLEXERIL) 10 MG tablet Give 1 tablet by tube 3 (three) times daily as needed for muscle spasms.  03/07/13  Yes Historical Provider, MD  esomeprazole (NEXIUM) 40 MG capsule Give 40 mg by tube daily before breakfast.    Yes Historical Provider, MD  gabapentin (NEURONTIN) 300 MG capsule Give 600-900 mg by tube 2 (two) times daily. Take 3 capsules in the morning and 2 capsules in the evening.   Yes Historical Provider, MD  HYDROcodone-acetaminophen (LORTAB) 7.5-500 MG per tablet Give 1 tablet by tube every 6 (six) hours as needed for pain. For pain   Yes Historical Provider, MD  Lacosamide (VIMPAT) 100 MG TABS Give 100 mg by tube 2 (two) times daily.    Yes Historical Provider, MD  meloxicam (MOBIC) 15 MG tablet Give 1 tablet by tube every morning.  03/10/13  Yes Historical Provider, MD  nystatin cream (MYCOSTATIN) Apply 1 application topically 2 (two) times daily as needed (irritation of incision).  03/11/13  Yes Historical Provider, MD  silver sulfADIAZINE (SILVADENE) 1 % cream Apply 1 application topically 2 (two) times daily as needed (  irritation of incision).  03/11/13  Yes Historical Provider, MD  tetrahydrozoline (VISINE) 0.05 % ophthalmic solution Place 2 drops into both eyes daily as needed (for dry eyes).   Yes Historical Provider, MD  topiramate (TOPAMAX) 100 MG tablet Give 100-150 mg by tube 2 (two) times daily. Take 1.5 tablets in the morning and 1 tablet in the evening   Yes Historical Provider, MD  traZODone (DESYREL) 50 MG tablet Give 50 mg by tube at bedtime.    Yes Historical Provider, MD  venlafaxine XR (EFFEXOR-XR) 37.5 MG 24 hr capsule Give 37.5 mg by tube daily with breakfast.   Yes Historical Provider, MD  magnesium citrate SOLN Give  1 Bottle by tube once.     Historical Provider, MD   Physical Exam: Filed Vitals:   06/12/13 0000  BP: 167/96  Pulse: 65  Temp:   Resp: 12    BP 167/96  Pulse 65  Temp(Src) 100 F (37.8 C) (Rectal)  Resp 12  SpO2 93%  General Appearance:    Alert, oriented, no distress, appears stated age  Head:    Normocephalic, atraumatic  Eyes:    PERRL, EOMI, sclera non-icteric        Nose:   Nares without drainage or epistaxis. Mucosa, turbinates normal  Throat:   Moist mucous membranes. Oropharynx without erythema or exudate.  Neck:   Supple. No carotid bruits.  No thyromegaly.  No lymphadenopathy.   Back:     No CVA tenderness, no spinal tenderness  Lungs:     Clear to auscultation bilaterally, without wheezes, rhonchi or rales  Chest wall:    No tenderness to palpitation  Heart:    Regular rate and rhythm without murmurs, gallops, rubs  Abdomen:     Soft, non-tender, nondistended, normal bowel sounds, no organomegaly  Genitalia:    deferred  Rectal:    deferred  Extremities:   No clubbing, cyanosis or edema.  Pulses:   2+ and symmetric all extremities  Skin:   Skin color, texture, turgor normal, no rashes or lesions  Lymph nodes:   Cervical, supraclavicular, and axillary nodes normal  Neurologic:   Able to answer yes/no questions, follows commands, at baseline per family    Labs on Admission:  Basic Metabolic Panel:  Recent Labs Lab 06/11/13 1903  NA 140  K 4.2  CL 102  CO2 29  GLUCOSE 100*  BUN 13  CREATININE 0.57  CALCIUM 9.6   Liver Function Tests:  Recent Labs Lab 06/11/13 1903  AST 23  ALT 19  ALKPHOS 98  BILITOT 0.3  PROT 7.8  ALBUMIN 4.2    Recent Labs Lab 06/11/13 1903  LIPASE 23    Recent Labs Lab 06/11/13 2000  AMMONIA 40   CBC:  Recent Labs Lab 06/11/13 1903  WBC 8.4  NEUTROABS 4.5  HGB 16.7  HCT 50.6  MCV 96.2  PLT 163   Cardiac Enzymes: No results found for this basename: CKTOTAL, CKMB, CKMBINDEX, TROPONINI,  in the last  168 hours  BNP (last 3 results) No results found for this basename: PROBNP,  in the last 8760 hours CBG: No results found for this basename: GLUCAP,  in the last 168 hours  Radiological Exams on Admission: Dg Chest 2 View  06/11/2013   CLINICAL DATA:  Seizures; hypertension  EXAM: CHEST  2 VIEW  COMPARISON:  March 30, 2013  FINDINGS: There is a small area of patchy infiltrate in the right base. Lungs are otherwise clear. Heart size  and pulmonary vascularity are normal. No adenopathy.  There is evidence of an old fracture of the left clavicle with resort shin of the lateral left clavicle. There is postoperative change in the cervical spine.  IMPRESSION: Patchy infiltrate left base.   Electronically Signed   By: Bretta Bang M.D.   On: 06/11/2013 20:41   Dg Abd 1 View  06/11/2013   CLINICAL DATA:  Entire abdominal pain. Distention. The patient has a G-tube  EXAM: ABDOMEN - 1 VIEW  COMPARISON:  10/18/2012  FINDINGS: Gastrostomy tube is identified in the left upper quadrant of the abdomen. There is moderate stool within the ascending and rectosigmoid colon. No evidence for small bowel dilatation. Degenerative changes are seen in the lumbar spine and hips. No free intraperitoneal air identified.  IMPRESSION: 1. Left upper quadrant gastrostomy tube identified. 2. Moderate stool burden. 3. Nonobstructive bowel gas pattern.   Electronically Signed   By: Rosalie Gums M.D.   On: 06/11/2013 20:49    EKG: Independently reviewed.  Assessment/Plan Principal Problem:   HCAP (healthcare-associated pneumonia) Active Problems:   Unspecified constipation   1. HCAP - patient was last in hospital in September of this year (for CAP that time actually and was admitted by myself that admission as well), will treat LLL infiltrate and fever as HCAP.  Cultures pending, patient on PNA pathway. 2. Constipation - no BM in 5 days, X ray of abdomen demonstrates moderate stool burden, will do half a bottle of mag  citrate via tube to see if this helps. 3. Neurodegenerative disorder - patient at baseline per family and unchanged since the last time I admitted him in September, have ordered to start PEG feeds per home regimen     Code Status: Full  Family Communication: Spoke with caregiver at bedside Disposition Plan: Admit to inpatient, likely for several days, family also expresses concern that he was released too early last admission.   Time spent: 70 min  Alize Borrayo M. [facility] Pager [phone]  If 7AM-7PM, please contact the day team taking care of the patient [email or web address] Password [email or web address] 06/12/2013, 12:44 AM

## 2013-06-12 NOTE — Progress Notes (Signed)
ANTIBIOTIC CONSULT NOTE - INITIAL  Pharmacy Consult for vancomycin Indication: rule out pneumonia  No Known Allergies  Patient Measurements: Weight: ~85kg  Vital Signs: Temp: 100 F (37.8 C) (12/10 2128) Temp src: Rectal (12/10 2128) BP: 167/96 mmHg (12/11 0000) Pulse Rate: 65 (12/11 0000)  Labs:  Recent Labs  06/11/13 1903  WBC 8.4  HGB 16.7  PLT 163  CREATININE 0.57    Microbiology: No results found for this or any previous visit (from the past 720 hour(s)).  Medical History: Past Medical History  Diagnosis Date  . Diabetes mellitus   . Hypertension   . Seizure   . Hypercholesterolemia   . CNS degenerative disease   . Headache(784.0)   . Arthritis   . Gout   . Low back pain radiating to left leg   . Keratoconus   . Supranuclear palsy   . Cervical myelopathy 10/09/2012  . C1 cervical fracture 10/09/2012  . Depression 10/09/2012  . CVA (cerebral infarction)     Medications:  Prescriptions prior to admission  Medication Sig Dispense Refill  . allopurinol (ZYLOPRIM) 300 MG tablet Give 300 mg by tube daily.      . cholecalciferol (VITAMIN D) 1000 UNITS tablet Give 1,000 Units by tube 2 (two) times daily.       . citalopram (CELEXA) 40 MG tablet Give 40 mg by tube daily.       . cyclobenzaprine (FLEXERIL) 10 MG tablet Give 1 tablet by tube 3 (three) times daily as needed for muscle spasms.       Marland Kitchen esomeprazole (NEXIUM) 40 MG capsule Give 40 mg by tube daily before breakfast.       . gabapentin (NEURONTIN) 300 MG capsule Give 600-900 mg by tube 2 (two) times daily. Take 3 capsules in the morning and 2 capsules in the evening.      Marland Kitchen HYDROcodone-acetaminophen (LORTAB) 7.5-500 MG per tablet Give 1 tablet by tube every 6 (six) hours as needed for pain. For pain      . Lacosamide (VIMPAT) 100 MG TABS Give 100 mg by tube 2 (two) times daily.       . meloxicam (MOBIC) 15 MG tablet Give 1 tablet by tube every morning.       . nystatin cream (MYCOSTATIN) Apply 1  application topically 2 (two) times daily as needed (irritation of incision).       . silver sulfADIAZINE (SILVADENE) 1 % cream Apply 1 application topically 2 (two) times daily as needed (irritation of incision).       Marland Kitchen tetrahydrozoline (VISINE) 0.05 % ophthalmic solution Place 2 drops into both eyes daily as needed (for dry eyes).      . topiramate (TOPAMAX) 100 MG tablet Give 100-150 mg by tube 2 (two) times daily. Take 1.5 tablets in the morning and 1 tablet in the evening      . traZODone (DESYREL) 50 MG tablet Give 50 mg by tube at bedtime.       Marland Kitchen venlafaxine XR (EFFEXOR-XR) 37.5 MG 24 hr capsule Give 37.5 mg by tube daily with breakfast.      . magnesium citrate SOLN Give 1 Bottle by tube once.        Scheduled:  . allopurinol  300 mg Per Tube Daily  . ceFEPime (MAXIPIME) IV  1 g Intravenous Q8H  . citalopram  40 mg Per Tube Daily  . gabapentin  600-900 mg Per Tube BID  . heparin  5,000 Units Subcutaneous Q8H  . lacosamide  100 mg Per Tube BID  . magnesium citrate  0.5 Bottle Per Tube Once  . meloxicam  15 mg Per Tube q morning - 10a  . pantoprazole sodium  80 mg Per Tube Daily  . piperacillin-tazobactam (ZOSYN)  IV  3.375 g Intravenous Once  . topiramate  100-150 mg Per Tube BID  . traZODone  50 mg Per Tube QHS  . vancomycin  1,250 mg Intravenous Once  . venlafaxine XR  37.5 mg Oral Q breakfast    Assessment: 57yo male c/o constipation x5d w/ abdominal swelling, pain, tremors, and weakness, totally dependent for ADL, caregiver states pt is not at baseline, CXR reveals patchy infiltrate at left base, to begin IV ABX.  Goal of Therapy:  Vancomycin trough level 15-20 mcg/ml  Plan:  Vanc 1250mg  and Zosyn 3.375g IV ordered in ED; will continue with vancomycin 1g IV Q8H and monitor CBC, Cx, levels prn.  Vernard Gambles, PharmD, BCPS  06/12/2013,12:51 AM

## 2013-06-12 NOTE — Progress Notes (Signed)
[facility] PROGRESS NOTE  Alexander [name] WUJ:[id number] DOB: January 04, 1956 DOA: 06/11/2013 PCP: Dema Severin, NP  Assessment/Plan: 1. HCAP 1. patient was last in hospital in September of this year 2. Afebrile, no leukocytosis 3. Tolerating abx 2. Constipation 1. no BM in 5 days 2. X ray of abdomen demonstrates moderate stool burden 3. Neurodegenerative disorder 1. patient at baseline per family 2. Cont PEG feeds per home regimen  Code Status: Full Family Communication: Pt in room (indicate person spoken with, relationship, and if by phone, the number) Disposition Plan: Pending  Antibiotics:  Vancomycin 06/11/13>>>  Cefepime 06/11/13>>>  HPI/Subjective: No acute events noted overnight  Objective: Filed Vitals:   06/11/13 2315 06/12/13 0000 06/12/13 0107 06/12/13 0618  BP: 148/106 167/96 123/83 124/85  Pulse: 72 65 80 68  Temp:   98.2 F (36.8 C)   TempSrc:   Oral   Resp: 17 12 20 18   Height:   5\' 9"  (1.753 m)   Weight:   85.3 kg (188 lb 0.8 oz)   SpO2: 92% 93% 92% 91%   No intake or output data in the 24 hours ending 06/12/13 0830 Filed Weights   06/12/13 0107  Weight: 85.3 kg (188 lb 0.8 oz)    Exam:  General:  Awake, in nad  Cardiovascular: regular, s1, s2  Respiratory: normal resp effort, no wheezing  Abdomen: soft, nondistened  Musculoskeletal: perfused, no clubbing   Data Reviewed: Basic Metabolic Panel:  Recent Labs Lab 06/11/13 1903 06/12/13 0620  NA 140 139  K 4.2 3.8  CL 102 103  CO2 29 29  GLUCOSE 100* 98  BUN 13 14  CREATININE 0.57 0.56  CALCIUM 9.6 9.1   Liver Function Tests:  Recent Labs Lab 06/11/13 1903  AST 23  ALT 19  ALKPHOS 98  BILITOT 0.3  PROT 7.8  ALBUMIN 4.2    Recent Labs Lab 06/11/13 1903  LIPASE 23    Recent Labs Lab 06/11/13 2000  AMMONIA 40   CBC:  Recent Labs Lab 06/11/13 1903 06/12/13 0620  WBC 8.4 8.4  NEUTROABS 4.5  --   HGB 16.7 15.5  HCT 50.6 47.5  MCV 96.2 96.9  PLT  163 138*   Cardiac Enzymes: No results found for this basename: CKTOTAL, CKMB, CKMBINDEX, TROPONINI,  in the last 168 hours BNP (last 3 results) No results found for this basename: PROBNP,  in the last 8760 hours CBG: No results found for this basename: GLUCAP,  in the last 168 hours  No results found for this or any previous visit (from the past 240 hour(s)).   Studies: Dg Chest 2 View  06/11/2013   CLINICAL DATA:  Seizures; hypertension  EXAM: CHEST  2 VIEW  COMPARISON:  March 30, 2013  FINDINGS: There is a small area of patchy infiltrate in the right base. Lungs are otherwise clear. Heart size and pulmonary vascularity are normal. No adenopathy.  There is evidence of an old fracture of the left clavicle with resort shin of the lateral left clavicle. There is postoperative change in the cervical spine.  IMPRESSION: Patchy infiltrate left base.   Electronically Signed   By: Bretta Bang M.D.   On: 06/11/2013 20:41   Dg Abd 1 View  06/11/2013   CLINICAL DATA:  Entire abdominal pain. Distention. The patient has a G-tube  EXAM: ABDOMEN - 1 VIEW  COMPARISON:  10/18/2012  FINDINGS: Gastrostomy tube is identified in the left upper quadrant of the abdomen. There is moderate stool within  the ascending and rectosigmoid colon. No evidence for small bowel dilatation. Degenerative changes are seen in the lumbar spine and hips. No free intraperitoneal air identified.  IMPRESSION: 1. Left upper quadrant gastrostomy tube identified. 2. Moderate stool burden. 3. Nonobstructive bowel gas pattern.   Electronically Signed   By: Rosalie Gums M.D.   On: 06/11/2013 20:49    Scheduled Meds: . allopurinol  300 mg Per Tube Daily  . antiseptic oral rinse  15 mL Mouth Rinse BID  . ceFEPime (MAXIPIME) IV  1 g Intravenous Q8H  . citalopram  40 mg Per Tube Daily  . gabapentin  600 mg Oral QHS  . gabapentin  900 mg Oral Daily  . heparin  5,000 Units Subcutaneous Q8H  . lacosamide  100 mg Per Tube BID  .  meloxicam  15 mg Per Tube q morning - 10a  . pantoprazole sodium  80 mg Per Tube Daily  . topiramate  100 mg Oral QHS  . topiramate  150 mg Oral Daily  . traZODone  50 mg Per Tube QHS  . vancomycin  1,000 mg Intravenous Q8H  . venlafaxine XR  37.5 mg Oral Q breakfast   Continuous Infusions: . sodium chloride 75 mL/hr at 06/12/13 0300    Principal Problem:   HCAP (healthcare-associated pneumonia) Active Problems:   Unspecified constipation  Time spent:  Sequoia Mincey K  [facility] Pager [phone]. If 7PM-7AM, please contact night-coverage at [email or web address], password [facility] 06/12/2013, 8:30 AM  LOS: 1 day
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
## 2013-06-12 NOTE — Progress Notes (Signed)
RN administered ordered GoLytely at 200 ml/hr unto tube (twice the pt's known home tube feed rate) this afternoon until time for tube feed at 1800. Approx 600 mls of GoLytely administered this afternoon (of 4L dose).

## 2013-06-13 DIAGNOSIS — F329 Major depressive disorder, single episode, unspecified: Secondary | ICD-10-CM

## 2013-06-13 LAB — CBC WITH DIFFERENTIAL/PLATELET
Basophils Absolute: 0 10*3/uL (ref 0.0–0.1)
Basophils Relative: 0 % (ref 0–1)
Eosinophils Absolute: 0.3 10*3/uL (ref 0.0–0.7)
Eosinophils Relative: 4 % (ref 0–5)
Lymphocytes Relative: 20 % (ref 12–46)
Lymphs Abs: 1.6 10*3/uL (ref 0.7–4.0)
MCH: 31.2 pg (ref 26.0–34.0)
MCV: 95.9 fL (ref 78.0–100.0)
Monocytes Absolute: 0.5 10*3/uL (ref 0.1–1.0)
Neutrophils Relative %: 71 % (ref 43–77)
Platelets: 145 10*3/uL — ABNORMAL LOW (ref 150–400)
RBC: 4.87 MIL/uL (ref 4.22–5.81)
RDW: 12.9 % (ref 11.5–15.5)
WBC: 8.2 10*3/uL (ref 4.0–10.5)

## 2013-06-13 LAB — GLUCOSE, CAPILLARY
Glucose-Capillary: 116 mg/dL — ABNORMAL HIGH (ref 70–99)
Glucose-Capillary: 92 mg/dL (ref 70–99)
Glucose-Capillary: 94 mg/dL (ref 70–99)

## 2013-06-13 MED ORDER — POLYETHYLENE GLYCOL 3350 17 G PO PACK: 17.0000 g | PACK | Freq: Every day | ORAL | Status: DC

## 2013-06-13 MED ORDER — CEFPODOXIME PROXETIL 100 MG/5ML PO SUSR: 100.0000 mg | Freq: Two times a day (BID) | ORAL | Status: DC

## 2013-06-13 MED ORDER — AZITHROMYCIN 200 MG/5ML PO SUSR: 500.0000 mg | Freq: Every day | ORAL | Status: DC

## 2013-06-13 NOTE — Care Management Note (Addendum)
    Page 1 of 1   06/13/2013     3:56:52 PM   CARE MANAGEMENT NOTE 06/13/2013  Patient:  Alexander [name], Alexander [name]   Account Number:  [id number]  Date Initiated:  06/13/2013  Documentation initiated by:  Letha Cape  Subjective/Objective Assessment:   dx hcap  admit- lives with caregiver.     Action/Plan:   Anticipated DC Date:  06/13/2013   Anticipated DC Plan:        DC Planning Services  CM consult      Choice offered to / List presented to:             Status of service:  Completed, signed off Medicare Important Message given?   (If response is "NO", the following Medicare IM given date fields will be blank) Date Medicare IM given:   Date Additional Medicare IM given:    Discharge Disposition:  HOME/SELF CARE  Per UR Regulation:  Reviewed for med. necessity/level of care/duration of stay  If discussed at Long Length of Stay Meetings, dates discussed:    Comments:  06/13/13 13:43 Letha Cape RN, BSN [phone] patient lives with caregiver 24 hrs, patient is for dc today, patient does not need ambulance transport.

## 2013-06-13 NOTE — Progress Notes (Signed)
Alexander [name] to be D/C'd Home per MD order.  Discussed with the patient's mother and all questions fully answered.    Medication List         allopurinol 300 MG tablet  Commonly known as:  ZYLOPRIM  Give 300 mg by tube daily.     azithromycin 200 MG/5ML suspension  Commonly known as:  ZITHROMAX  Place 12.5 mLs (500 mg total) into feeding tube daily.     cefpodoxime 100 MG/5ML suspension  Commonly known as:  VANTIN  Place 5 mLs (100 mg total) into feeding tube 2 (two) times daily.     cholecalciferol 1000 UNITS tablet  Commonly known as:  VITAMIN D  Give 1,000 Units by tube 2 (two) times daily.     citalopram 40 MG tablet  Commonly known as:  CELEXA  Give 40 mg by tube daily.     cyclobenzaprine 10 MG tablet  Commonly known as:  FLEXERIL  Give 1 tablet by tube 3 (three) times daily as needed for muscle spasms.     esomeprazole 40 MG capsule  Commonly known as:  NEXIUM  Give 40 mg by tube daily before breakfast.     gabapentin 300 MG capsule  Commonly known as:  NEURONTIN  Give 600-900 mg by tube 2 (two) times daily. Take 3 capsules in the morning and 2 capsules in the evening.     HYDROcodone-acetaminophen 7.5-500 MG per tablet  Commonly known as:  LORTAB  Give 1 tablet by tube every 6 (six) hours as needed for pain. For pain     magnesium citrate Soln  Give 1 Bottle by tube once.     meloxicam 15 MG tablet  Commonly known as:  MOBIC  Give 1 tablet by tube every morning.     nystatin cream  Commonly known as:  MYCOSTATIN  Apply 1 application topically 2 (two) times daily as needed (irritation of incision).     polyethylene glycol packet  Commonly known as:  MIRALAX / GLYCOLAX  Take 17 g by mouth daily.     silver sulfADIAZINE 1 % cream  Commonly known as:  SILVADENE  Apply 1 application topically 2 (two) times daily as needed (irritation of incision).     topiramate 100 MG tablet  Commonly known as:  TOPAMAX  Give 100-150 mg by tube 2 (two) times  daily. Take 1.5 tablets in the morning and 1 tablet in the evening     traZODone 50 MG tablet  Commonly known as:  DESYREL  Give 50 mg by tube at bedtime.     venlafaxine XR 37.5 MG 24 hr capsule  Commonly known as:  EFFEXOR-XR  Give 37.5 mg by tube daily with breakfast.     VIMPAT 100 MG Tabs  Generic drug:  Lacosamide  Give 100 mg by tube 2 (two) times daily.     VISINE 0.05 % ophthalmic solution  Generic drug:  tetrahydrozoline  Place 2 drops into both eyes daily as needed (for dry eyes).        VVS, Skin clean, dry and intact without evidence of skin break down, no evidence of skin tears noted. IV catheter discontinued intact. Site without signs and symptoms of complications. Dressing and pressure applied.  An After Visit Summary was printed and given to the patient's mother. Follow up appointments , new prescriptions and medication administration times given Patient escorted via WC, and D/C home via private auto.  Cindra Eves, RN 06/13/2013 1:17 PM

## 2013-06-13 NOTE — Discharge Summary (Signed)
Physician Discharge Summary  Alexander [name] ZOX:[id number] DOB: 1956-03-05 DOA: 06/11/2013  PCP: Dema Severin, NP  Admit date: 06/11/2013 Discharge date: 06/13/2013  Time spent: 35 minutes  Recommendations for Outpatient Follow-up:  1. Follow up with PCP in 1-2 weeks  Discharge Diagnoses:  Principal Problem:   HCAP (healthcare-associated pneumonia) Active Problems:   Unspecified constipation   Discharge Condition: Stable  Diet recommendation: Resume tube feeds  Filed Weights   06/12/13 0107 06/13/13 0500  Weight: 85.3 kg (188 lb 0.8 oz) 90.4 kg (199 lb 4.7 oz)    History of present illness:  Alexander [name] is a 57 y.o. male known to me from previous admission. The patient has a history of a progressive neuro degernative disease which has left him among other things with dysphagia. He takes most of his food via PEG tube but fully admits he is not always complaint with this and will still at times attempt to take PO food. Unfortunately due to either this or his general debility from the CNS disease he does get frequent pneumonias. Patient has had a change in his voice and more wet sounding breath sounds onset 3 days ago, and progressively worsening.  Hospital Course:  HCAP  1. patient was last in hospital in September of this year 2. Remained afebrile, no leukocytosis 3. Tolerated IV abx 4. Plan to transition to azithro and vantin per tube to complete tx Constipation  1. no BM in 5 days 2. X ray of abdomen demonstrates moderate stool burden 3. Improved with golytely Neurodegenerative disorder  1. patient at baseline per family 2. Cont PEG feeds per home regimen  Discharge Exam: Filed Vitals:   06/12/13 0618 06/12/13 1306 06/12/13 2151 06/13/13 0500  BP: 124/85 134/81 123/83 131/87  Pulse: 68 77 70 88  Temp:  97.8 F (36.6 C) 97.9 F (36.6 C) 98.1 F (36.7 C)  TempSrc:  Oral Oral Oral  Resp: 18 18 18    Height:      Weight:    90.4 kg (199 lb 4.7 oz)  SpO2:  91% 91% 91% 90%    General: Awake, in nad Cardiovascular: regular, s1, s2 Respiratory: normal resp effort, no wheezing  Discharge Instructions       Future Appointments Provider Department Dept Phone   10/09/2013 2:30 PM Huston Foley, MD [facility] [phone]       Medication List         allopurinol 300 MG tablet  Commonly known as:  ZYLOPRIM  Give 300 mg by tube daily.     azithromycin 200 MG/5ML suspension  Commonly known as:  ZITHROMAX  Place 12.5 mLs (500 mg total) into feeding tube daily.     cefpodoxime 100 MG/5ML suspension  Commonly known as:  VANTIN  Place 5 mLs (100 mg total) into feeding tube 2 (two) times daily.     cholecalciferol 1000 UNITS tablet  Commonly known as:  VITAMIN D  Give 1,000 Units by tube 2 (two) times daily.     citalopram 40 MG tablet  Commonly known as:  CELEXA  Give 40 mg by tube daily.     cyclobenzaprine 10 MG tablet  Commonly known as:  FLEXERIL  Give 1 tablet by tube 3 (three) times daily as needed for muscle spasms.     esomeprazole 40 MG capsule  Commonly known as:  NEXIUM  Give 40 mg by tube daily before breakfast.     gabapentin 300 MG capsule  Commonly known as:  NEURONTIN  Give 600-900 mg by tube 2 (two) times daily. Take 3 capsules in the morning and 2 capsules in the evening.     HYDROcodone-acetaminophen 7.5-500 MG per tablet  Commonly known as:  LORTAB  Give 1 tablet by tube every 6 (six) hours as needed for pain. For pain     magnesium citrate Soln  Give 1 Bottle by tube once.     meloxicam 15 MG tablet  Commonly known as:  MOBIC  Give 1 tablet by tube every morning.     nystatin cream  Commonly known as:  MYCOSTATIN  Apply 1 application topically 2 (two) times daily as needed (irritation of incision).     polyethylene glycol packet  Commonly known as:  MIRALAX / GLYCOLAX  Take 17 g by mouth daily.     silver sulfADIAZINE 1 % cream  Commonly known as:  SILVADENE  Apply 1  application topically 2 (two) times daily as needed (irritation of incision).     topiramate 100 MG tablet  Commonly known as:  TOPAMAX  Give 100-150 mg by tube 2 (two) times daily. Take 1.5 tablets in the morning and 1 tablet in the evening     traZODone 50 MG tablet  Commonly known as:  DESYREL  Give 50 mg by tube at bedtime.     venlafaxine XR 37.5 MG 24 hr capsule  Commonly known as:  EFFEXOR-XR  Give 37.5 mg by tube daily with breakfast.     VIMPAT 100 MG Tabs  Generic drug:  Lacosamide  Give 100 mg by tube 2 (two) times daily.     VISINE 0.05 % ophthalmic solution  Generic drug:  tetrahydrozoline  Place 2 drops into both eyes daily as needed (for dry eyes).       No Known Allergies Follow-up Information   Follow up with Dema Severin, NP. Schedule an appointment as soon as possible for a visit in 1 week.   Contact information:   [address] [place] [address] [phone]        The results of significant diagnostics from this hospitalization (including imaging, microbiology, ancillary and laboratory) are listed below for reference.    Significant Diagnostic Studies: Dg Chest 2 View  06/11/2013   CLINICAL DATA:  Seizures; hypertension  EXAM: CHEST  2 VIEW  COMPARISON:  March 30, 2013  FINDINGS: There is a small area of patchy infiltrate in the right base. Lungs are otherwise clear. Heart size and pulmonary vascularity are normal. No adenopathy.  There is evidence of an old fracture of the left clavicle with resort shin of the lateral left clavicle. There is postoperative change in the cervical spine.  IMPRESSION: Patchy infiltrate left base.   Electronically Signed   By: Bretta Bang M.D.   On: 06/11/2013 20:41   Dg Abd 1 View  06/11/2013   CLINICAL DATA:  Entire abdominal pain. Distention. The patient has a G-tube  EXAM: ABDOMEN - 1 VIEW  COMPARISON:  10/18/2012  FINDINGS: Gastrostomy tube is identified in the left upper quadrant of the abdomen.  There is moderate stool within the ascending and rectosigmoid colon. No evidence for small bowel dilatation. Degenerative changes are seen in the lumbar spine and hips. No free intraperitoneal air identified.  IMPRESSION: 1. Left upper quadrant gastrostomy tube identified. 2. Moderate stool burden. 3. Nonobstructive bowel gas pattern.   Electronically Signed   By: Rosalie Gums M.D.   On: 06/11/2013 20:49   Ir Gastric Tube Perc Chg W/o  Img Guide  05/21/2013   CLINICAL DATA:  Malfunctioning gastrostomy tube.  Needs replacement.  EXAM: GASTROSTOMY CATHETER REPLACEMENT  MEDICATIONS: None  ANESTHESIA/SEDATION: None  PROCEDURE: Patient has a left upper quadrant balloon retention gastrostomy tube. The port to deflate the balloon has become dysfunctional and the original cap/plug has become broken such that there is persistent leaking. Decision was made to exchange the tube. The gastrostomy tube was cut on the side to allow the balloon to deflate. An intact gastrostomy tube was removed without difficulty. A new 22 French gastrostomy tube was inserted without difficulty. The balloon was inflated with 8 cc of sterile saline. Bumper was made snug with a single layer of sterile gauze beneath it to protect the skin. The gastrostomy port was then flushed with 10 cc of normal saline without difficulty. Patient tolerated the procedure well without complication.  IMPRESSION: Successful exchange of balloon retention gastrostomy tube.  Please note patient now has 22 [place]  Read by: Brayton El PA-C  : None   Electronically Signed   By: Irish Lack M.D.   On: 05/21/2013 13:49    Microbiology: Recent Results (from the past 240 hour(s))  CULTURE, BLOOD (ROUTINE X 2)     Status: None   Collection Time    06/11/13 11:00 PM      Result Value Range Status   Specimen Description BLOOD ARM RIGHT   Final   Special Requests BOTTLES DRAWN AEROBIC AND ANAEROBIC 10CC   Final   Culture  Setup Time     Final   Value: 06/12/2013  04:46     Performed at [facility]   Culture     Final   Value:        BLOOD CULTURE RECEIVED NO GROWTH TO DATE CULTURE WILL BE HELD FOR 5 DAYS BEFORE ISSUING A FINAL NEGATIVE REPORT     Performed at [facility]   Report Status PENDING   Incomplete  CULTURE, BLOOD (ROUTINE X 2)     Status: None   Collection Time    06/11/13 11:10 PM      Result Value Range Status   Specimen Description BLOOD HAND RIGHT   Final   Special Requests BOTTLES DRAWN AEROBIC ONLY 9CC   Final   Culture  Setup Time     Final   Value: 06/12/2013 04:46     Performed at [facility]   Culture     Final   Value:        BLOOD CULTURE RECEIVED NO GROWTH TO DATE CULTURE WILL BE HELD FOR 5 DAYS BEFORE ISSUING A FINAL NEGATIVE REPORT     Performed at [facility]   Report Status PENDING   Incomplete     Labs: Basic Metabolic Panel:  Recent Labs Lab 06/11/13 1903 06/12/13 0620  NA 140 139  K 4.2 3.8  CL 102 103  CO2 29 29  GLUCOSE 100* 98  BUN 13 14  CREATININE 0.57 0.56  CALCIUM 9.6 9.1   Liver Function Tests:  Recent Labs Lab 06/11/13 1903  AST 23  ALT 19  ALKPHOS 98  BILITOT 0.3  PROT 7.8  ALBUMIN 4.2    Recent Labs Lab 06/11/13 1903  LIPASE 23    Recent Labs Lab 06/11/13 2000  AMMONIA 40   CBC:  Recent Labs Lab 06/11/13 1903 06/12/13 0620 06/13/13 0940  WBC 8.4 8.4 8.2  NEUTROABS 4.5  --  5.8  HGB 16.7 15.5 15.2  HCT 50.6  47.5 46.7  MCV 96.2 96.9 95.9  PLT 163 138* 145*   Cardiac Enzymes: No results found for this basename: CKTOTAL, CKMB, CKMBINDEX, TROPONINI,  in the last 168 hours BNP: BNP (last 3 results) No results found for this basename: PROBNP,  in the last 8760 hours CBG:  Recent Labs Lab 06/12/13 1635 06/12/13 2103 06/13/13 0033 06/13/13 0604 06/13/13 0753  GLUCAP 95 94 92 94 125*   Signed:  Hollie Wojahn K  [facility] 06/13/2013, 11:36 AM

## 2013-06-14 ENCOUNTER — Other Ambulatory Visit: Payer: Self-pay | Admitting: Neurology

## 2013-06-18 LAB — CULTURE, BLOOD (ROUTINE X 2): Culture: NO GROWTH

## 2013-07-06 ENCOUNTER — Encounter (HOSPITAL_COMMUNITY): Payer: Self-pay | Admitting: Emergency Medicine

## 2013-07-06 ENCOUNTER — Inpatient Hospital Stay (HOSPITAL_COMMUNITY)
Admission: EM | Admit: 2013-07-06 | Discharge: 2013-07-14 | DRG: 194 | Disposition: A | Discharge status: Skilled Nursing Facility | Payer: Medicare Other | Attending: Internal Medicine | Admitting: Internal Medicine

## 2013-07-06 ENCOUNTER — Emergency Department (HOSPITAL_COMMUNITY): Payer: Medicare Other

## 2013-07-06 DIAGNOSIS — I10 Essential (primary) hypertension: Secondary | ICD-10-CM | POA: Diagnosis present

## 2013-07-06 DIAGNOSIS — K5900 Constipation, unspecified: Secondary | ICD-10-CM | POA: Diagnosis present

## 2013-07-06 DIAGNOSIS — L03319 Cellulitis of trunk, unspecified: Secondary | ICD-10-CM

## 2013-07-06 DIAGNOSIS — F329 Major depressive disorder, single episode, unspecified: Secondary | ICD-10-CM | POA: Diagnosis present

## 2013-07-06 DIAGNOSIS — E119 Type 2 diabetes mellitus without complications: Secondary | ICD-10-CM | POA: Diagnosis present

## 2013-07-06 DIAGNOSIS — R569 Unspecified convulsions: Secondary | ICD-10-CM | POA: Diagnosis present

## 2013-07-06 DIAGNOSIS — Z931 Gastrostomy status: Secondary | ICD-10-CM

## 2013-07-06 DIAGNOSIS — L02219 Cutaneous abscess of trunk, unspecified: Secondary | ICD-10-CM | POA: Diagnosis present

## 2013-07-06 DIAGNOSIS — M129 Arthropathy, unspecified: Secondary | ICD-10-CM | POA: Diagnosis present

## 2013-07-06 DIAGNOSIS — Z8673 Personal history of transient ischemic attack (TIA), and cerebral infarction without residual deficits: Secondary | ICD-10-CM

## 2013-07-06 DIAGNOSIS — E7800 Pure hypercholesterolemia, unspecified: Secondary | ICD-10-CM | POA: Diagnosis present

## 2013-07-06 DIAGNOSIS — M4712 Other spondylosis with myelopathy, cervical region: Secondary | ICD-10-CM | POA: Diagnosis present

## 2013-07-06 DIAGNOSIS — Z87891 Personal history of nicotine dependence: Secondary | ICD-10-CM

## 2013-07-06 DIAGNOSIS — J189 Pneumonia, unspecified organism: Secondary | ICD-10-CM | POA: Diagnosis present

## 2013-07-06 DIAGNOSIS — M109 Gout, unspecified: Secondary | ICD-10-CM | POA: Diagnosis present

## 2013-07-06 DIAGNOSIS — F3289 Other specified depressive episodes: Secondary | ICD-10-CM | POA: Diagnosis present

## 2013-07-06 DIAGNOSIS — G959 Disease of spinal cord, unspecified: Secondary | ICD-10-CM | POA: Diagnosis present

## 2013-07-06 DIAGNOSIS — F32A Depression, unspecified: Secondary | ICD-10-CM | POA: Diagnosis present

## 2013-07-06 LAB — CBC WITH DIFFERENTIAL/PLATELET
Basophils Absolute: 0 10*3/uL (ref 0.0–0.1)
Basophils Relative: 0 % (ref 0–1)
EOS ABS: 0.1 10*3/uL (ref 0.0–0.7)
Eosinophils Relative: 1 % (ref 0–5)
HCT: 47.3 % (ref 39.0–52.0)
Hemoglobin: 15.6 g/dL (ref 13.0–17.0)
LYMPHS ABS: 0.8 10*3/uL (ref 0.7–4.0)
Lymphocytes Relative: 9 % — ABNORMAL LOW (ref 12–46)
MCH: 31.6 pg (ref 26.0–34.0)
MCHC: 33 g/dL (ref 30.0–36.0)
MCV: 95.9 fL (ref 78.0–100.0)
Monocytes Absolute: 0.9 10*3/uL (ref 0.1–1.0)
Monocytes Relative: 10 % (ref 3–12)
Neutro Abs: 6.8 10*3/uL (ref 1.7–7.7)
Neutrophils Relative %: 79 % — ABNORMAL HIGH (ref 43–77)
Platelets: 132 10*3/uL — ABNORMAL LOW (ref 150–400)
RBC: 4.93 MIL/uL (ref 4.22–5.81)
RDW: 13.3 % (ref 11.5–15.5)
WBC: 8.6 10*3/uL (ref 4.0–10.5)

## 2013-07-06 LAB — GLUCOSE, CAPILLARY
GLUCOSE-CAPILLARY: 74 mg/dL (ref 70–99)
GLUCOSE-CAPILLARY: 94 mg/dL (ref 70–99)
Glucose-Capillary: 121 mg/dL — ABNORMAL HIGH (ref 70–99)

## 2013-07-06 LAB — COMPREHENSIVE METABOLIC PANEL
ALT: 16 U/L (ref 0–53)
AST: 24 U/L (ref 0–37)
Albumin: 3.4 g/dL — ABNORMAL LOW (ref 3.5–5.2)
Alkaline Phosphatase: 81 U/L (ref 39–117)
BUN: 13 mg/dL (ref 6–23)
CO2: 32 mEq/L (ref 19–32)
Calcium: 8.4 mg/dL (ref 8.4–10.5)
Chloride: 91 mEq/L — ABNORMAL LOW (ref 96–112)
Creatinine, Ser: 0.58 mg/dL (ref 0.50–1.35)
GFR calc Af Amer: >90 mL/min (ref >90)
GFR calc non Af Amer: >90 mL/min (ref >90)
Glucose, Bld: 101 mg/dL — ABNORMAL HIGH (ref 70–99)
Potassium: 4.6 mEq/L (ref 3.7–5.3)
Sodium: 131 mEq/L — ABNORMAL LOW (ref 137–147)
TOTAL PROTEIN: 6.8 g/dL (ref 6.0–8.3)
Total Bilirubin: <0.2 mg/dL — ABNORMAL LOW (ref 0.3–1.2)

## 2013-07-06 LAB — INFLUENZA PANEL BY PCR (TYPE A & B)
H1N1FLUPCR: NOT DETECTED
Influenza A By PCR: NEGATIVE
Influenza B By PCR: NEGATIVE

## 2013-07-06 LAB — TROPONIN I: Troponin I: <0.3 ng/mL (ref <0.30)

## 2013-07-06 LAB — POCT I-STAT TROPONIN I: Troponin i, poc: 0 ng/mL (ref 0.00–0.08)

## 2013-07-06 LAB — CG4 I-STAT (LACTIC ACID): LACTIC ACID, VENOUS: 0.96 mmol/L (ref 0.5–2.2)

## 2013-07-06 MED ORDER — TOPIRAMATE 100 MG PO TABS: 100.0000 mg | ORAL_TABLET | Freq: Two times a day (BID) | ORAL | Status: DC

## 2013-07-06 MED ORDER — PANTOPRAZOLE SODIUM 40 MG PO TBEC
80.0000 mg | DELAYED_RELEASE_TABLET | Freq: Every day | ORAL | Status: DC
Start: 2013-07-06 — End: 2013-07-06

## 2013-07-06 MED ORDER — CITALOPRAM HYDROBROMIDE 40 MG PO TABS
40.0000 mg | ORAL_TABLET | Freq: Every day | ORAL | Status: DC
  Administered 2013-07-06 – 2013-07-14 (×9): 40 mg
  Filled 2013-07-06 (×9): qty 1

## 2013-07-06 MED ORDER — GABAPENTIN 300 MG PO CAPS
600.0000 mg | ORAL_CAPSULE | Freq: Every day | ORAL | Status: DC
  Administered 2013-07-06 – 2013-07-13 (×8): 600 mg via ORAL
  Filled 2013-07-06 (×9): qty 2

## 2013-07-06 MED ORDER — VANCOMYCIN HCL IN DEXTROSE 1-5 GM/200ML-% IV SOLN
1000.0000 mg | Freq: Three times a day (TID) | INTRAVENOUS | Status: DC
  Filled 2013-07-06 (×2): qty 200

## 2013-07-06 MED ORDER — ASPIRIN 325 MG PO TABS
325.0000 mg | ORAL_TABLET | Freq: Once | ORAL | Status: AC
  Administered 2013-07-06: 325 mg via ORAL
  Filled 2013-07-06: qty 1

## 2013-07-06 MED ORDER — INSULIN ASPART 100 UNIT/ML ~~LOC~~ SOLN
0.0000 [IU] | Freq: Three times a day (TID) | SUBCUTANEOUS | Status: DC
  Administered 2013-07-07: 1 [IU] via SUBCUTANEOUS
  Administered 2013-07-08: 2 [IU] via SUBCUTANEOUS
  Administered 2013-07-08 – 2013-07-11 (×3): 1 [IU] via SUBCUTANEOUS

## 2013-07-06 MED ORDER — TRAZODONE HCL 50 MG PO TABS
50.0000 mg | ORAL_TABLET | Freq: Every day | ORAL | Status: DC
  Administered 2013-07-06 – 2013-07-13 (×8): 50 mg
  Filled 2013-07-06 (×9): qty 1

## 2013-07-06 MED ORDER — NAPHAZOLINE HCL 0.1 % OP SOLN
1.0000 [drp] | Freq: Four times a day (QID) | OPHTHALMIC | Status: DC | PRN
  Filled 2013-07-06: qty 15

## 2013-07-06 MED ORDER — PIPERACILLIN-TAZOBACTAM 3.375 G IVPB
3.3750 g | Freq: Three times a day (TID) | INTRAVENOUS | Status: DC
  Filled 2013-07-06 (×2): qty 50

## 2013-07-06 MED ORDER — PIPERACILLIN-TAZOBACTAM 3.375 G IVPB 30 MIN
3.3750 g | Freq: Once | INTRAVENOUS | Status: AC
  Administered 2013-07-06: 3.375 g via INTRAVENOUS
  Filled 2013-07-06: qty 50

## 2013-07-06 MED ORDER — CYCLOBENZAPRINE HCL 10 MG PO TABS
10.0000 mg | ORAL_TABLET | Freq: Three times a day (TID) | ORAL | Status: DC | PRN
  Administered 2013-07-06 – 2013-07-13 (×12): 10 mg
  Filled 2013-07-06 (×12): qty 1

## 2013-07-06 MED ORDER — POLYETHYLENE GLYCOL 3350 17 G PO PACK
17.0000 g | PACK | Freq: Every day | ORAL | Status: DC
  Administered 2013-07-06 – 2013-07-14 (×9): 17 g via ORAL
  Filled 2013-07-06 (×9): qty 1

## 2013-07-06 MED ORDER — LACOSAMIDE 100 MG PO TABS: 100.0000 mg | ORAL_TABLET | Freq: Two times a day (BID) | ORAL | Status: DC

## 2013-07-06 MED ORDER — TOPIRAMATE 100 MG PO TABS
100.0000 mg | ORAL_TABLET | Freq: Every day | ORAL | Status: DC
  Administered 2013-07-06 – 2013-07-13 (×8): 100 mg via ORAL
  Filled 2013-07-06 (×9): qty 1

## 2013-07-06 MED ORDER — ACETAMINOPHEN 325 MG PO TABS
650.0000 mg | ORAL_TABLET | Freq: Once | ORAL | Status: AC
  Administered 2013-07-06: 650 mg via ORAL
  Filled 2013-07-06: qty 2

## 2013-07-06 MED ORDER — NYSTATIN 100000 UNIT/GM EX CREA
1.0000 "application " | TOPICAL_CREAM | Freq: Two times a day (BID) | CUTANEOUS | Status: DC | PRN
  Administered 2013-07-13: 1 via TOPICAL
  Filled 2013-07-06: qty 15

## 2013-07-06 MED ORDER — VANCOMYCIN HCL IN DEXTROSE 1-5 GM/200ML-% IV SOLN
1000.0000 mg | Freq: Once | INTRAVENOUS | Status: AC
  Administered 2013-07-06: 1000 mg via INTRAVENOUS
  Filled 2013-07-06: qty 200

## 2013-07-06 MED ORDER — MORPHINE SULFATE 4 MG/ML IJ SOLN
4.0000 mg | Freq: Once | INTRAMUSCULAR | Status: AC
  Administered 2013-07-06: 4 mg via INTRAVENOUS
  Filled 2013-07-06: qty 1

## 2013-07-06 MED ORDER — LACOSAMIDE 50 MG PO TABS
100.0000 mg | ORAL_TABLET | Freq: Two times a day (BID) | ORAL | Status: DC
  Administered 2013-07-06 – 2013-07-14 (×17): 100 mg via ORAL
  Filled 2013-07-06 (×16): qty 2

## 2013-07-06 MED ORDER — VANCOMYCIN HCL IN DEXTROSE 1-5 GM/200ML-% IV SOLN
1000.0000 mg | Freq: Three times a day (TID) | INTRAVENOUS | Status: DC
  Administered 2013-07-06 – 2013-07-11 (×15): 1000 mg via INTRAVENOUS
  Filled 2013-07-06 (×18): qty 200

## 2013-07-06 MED ORDER — DEXTROSE-NACL 5-0.45 % IV SOLN
INTRAVENOUS | Status: AC
  Administered 2013-07-06: 15:00:00 via INTRAVENOUS

## 2013-07-06 MED ORDER — NAPHAZOLINE-GLYCERIN 0.012-0.2 % OP SOLN
1.0000 [drp] | OPHTHALMIC | Status: DC | PRN
  Administered 2013-07-13: 1 [drp] via OPHTHALMIC
  Filled 2013-07-06: qty 15

## 2013-07-06 MED ORDER — PANTOPRAZOLE SODIUM 40 MG PO PACK
40.0000 mg | PACK | Freq: Two times a day (BID) | ORAL | Status: DC
  Administered 2013-07-06 – 2013-07-14 (×17): 40 mg
  Filled 2013-07-06 (×20): qty 20

## 2013-07-06 MED ORDER — ENOXAPARIN SODIUM 40 MG/0.4ML ~~LOC~~ SOLN
40.0000 mg | SUBCUTANEOUS | Status: DC
  Administered 2013-07-06 – 2013-07-14 (×9): 40 mg via SUBCUTANEOUS
  Filled 2013-07-06 (×9): qty 0.4

## 2013-07-06 MED ORDER — PIPERACILLIN-TAZOBACTAM 3.375 G IVPB
3.3750 g | Freq: Three times a day (TID) | INTRAVENOUS | Status: DC
  Administered 2013-07-06 – 2013-07-11 (×15): 3.375 g via INTRAVENOUS
  Filled 2013-07-06 (×17): qty 50

## 2013-07-06 MED ORDER — JEVITY 1.2 CAL PO LIQD
1000.0000 mL | ORAL | Status: DC
  Administered 2013-07-06: 1000 mL
  Filled 2013-07-06 (×4): qty 1000

## 2013-07-06 MED ORDER — SODIUM CHLORIDE 0.9 % IV BOLUS (SEPSIS)
1000.0000 mL | Freq: Once | INTRAVENOUS | Status: AC
  Administered 2013-07-06: 1000 mL via INTRAVENOUS

## 2013-07-06 MED ORDER — GABAPENTIN 300 MG PO CAPS: 600.0000 mg | ORAL_CAPSULE | Freq: Two times a day (BID) | ORAL | Status: DC

## 2013-07-06 MED ORDER — SODIUM CHLORIDE 0.9 % IV SOLN
INTRAVENOUS | Status: DC
  Administered 2013-07-06: 13:00:00 via INTRAVENOUS

## 2013-07-06 MED ORDER — GABAPENTIN 300 MG PO CAPS
900.0000 mg | ORAL_CAPSULE | Freq: Every day | ORAL | Status: DC
  Administered 2013-07-06 – 2013-07-14 (×9): 900 mg via ORAL
  Filled 2013-07-06 (×9): qty 3

## 2013-07-06 MED ORDER — TOPIRAMATE 25 MG PO TABS
150.0000 mg | ORAL_TABLET | Freq: Every day | ORAL | Status: DC
  Administered 2013-07-06 – 2013-07-14 (×9): 150 mg via ORAL
  Filled 2013-07-06 (×9): qty 2

## 2013-07-06 MED ORDER — VENLAFAXINE HCL ER 37.5 MG PO CP24
37.5000 mg | ORAL_CAPSULE | Freq: Every day | ORAL | Status: DC
  Administered 2013-07-06 – 2013-07-08 (×3): 37.5 mg via ORAL
  Filled 2013-07-06 (×5): qty 1

## 2013-07-06 MED ORDER — ALLOPURINOL 300 MG PO TABS
300.0000 mg | ORAL_TABLET | Freq: Every day | ORAL | Status: DC
  Administered 2013-07-06 – 2013-07-14 (×9): 300 mg
  Filled 2013-07-06 (×9): qty 1

## 2013-07-06 NOTE — ED Notes (Signed)
Pt c/o left sidedCP that radiated to his left arm x2 days, developed fever today,
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
## 2013-07-07 ENCOUNTER — Inpatient Hospital Stay (HOSPITAL_COMMUNITY): Payer: Medicare Other

## 2013-07-07 LAB — GLUCOSE, CAPILLARY
GLUCOSE-CAPILLARY: 122 mg/dL — AB (ref 70–99)
Glucose-Capillary: 110 mg/dL — ABNORMAL HIGH (ref 70–99)
Glucose-Capillary: 106 mg/dL — ABNORMAL HIGH (ref 70–99)
Glucose-Capillary: 110 mg/dL — ABNORMAL HIGH (ref 70–99)

## 2013-07-07 LAB — CBC
HCT: 45.8 % (ref 39.0–52.0)
Hemoglobin: 15.1 g/dL (ref 13.0–17.0)
MCH: 31 pg (ref 26.0–34.0)
MCHC: 33 g/dL (ref 30.0–36.0)
MCV: 94 fL (ref 78.0–100.0)
Platelets: 120 10*3/uL — ABNORMAL LOW (ref 150–400)
RBC: 4.87 MIL/uL (ref 4.22–5.81)
RDW: 13.1 % (ref 11.5–15.5)
WBC: 7.3 10*3/uL (ref 4.0–10.5)

## 2013-07-07 LAB — BASIC METABOLIC PANEL
BUN: 11 mg/dL (ref 6–23)
CALCIUM: 8.6 mg/dL (ref 8.4–10.5)
CO2: 27 mEq/L (ref 19–32)
CREATININE: 0.51 mg/dL (ref 0.50–1.35)
Chloride: 96 mEq/L (ref 96–112)
GFR calc Af Amer: >90 mL/min (ref >90)
Glucose, Bld: 110 mg/dL — ABNORMAL HIGH (ref 70–99)
Potassium: 3.5 mEq/L — ABNORMAL LOW (ref 3.7–5.3)
SODIUM: 135 meq/L — AB (ref 137–147)

## 2013-07-07 LAB — STREP PNEUMONIAE URINARY ANTIGEN: STREP PNEUMO URINARY ANTIGEN: NEGATIVE

## 2013-07-07 LAB — TROPONIN I: Troponin I: <0.3 ng/mL (ref <0.30)

## 2013-07-07 MED ORDER — JEVITY 1.5 CAL PO LIQD
1000.0000 mL | ORAL | Status: DC
  Administered 2013-07-07: 1000 mL
  Administered 2013-07-08: 18:00:00
  Administered 2013-07-09: 1000 mL
  Administered 2013-07-09: 07:00:00
  Administered 2013-07-10: 1000 mL
  Filled 2013-07-07 (×5): qty 1000

## 2013-07-07 MED ORDER — FREE WATER
250.0000 mL | Freq: Four times a day (QID) | Status: DC
  Administered 2013-07-08 – 2013-07-14 (×26): 250 mL

## 2013-07-07 MED ORDER — FREE WATER: 100.0000 mL | Status: DC

## 2013-07-07 MED ORDER — NON FORMULARY: 1.0000 L | Status: DC

## 2013-07-07 NOTE — Progress Notes (Signed)
PROGRESS NOTE  Alexander [name] ONG:[id number] DOB: [birth year] DOA: 07/06/2013 PCP: Dema Severin, NP  Assessment/Plan: HCAP - with fever, tachypnea, tachycardia and CXR with possible PNA.  - Vanc/Zosyn given recent hospitalization.  - influenza negative - recurrent aspiration  Chest pain  - troponin negative x 3 Cervical myelopathy sp fall in 2012 - continue home medications, PEG tube feeds.  PEG tube cellulitis - Abx for #1 covering.    Diet: NPO, tube feeds Fluids: none DVT Prophylaxis: Lovenox  Code Status: Full Family Communication: none  Disposition Plan: home when ready  Consultants:  none  Procedures:  none   Antibiotics  Anti-infectives   Start     Dose/Rate Route Frequency Ordered Stop   07/06/13 1500  piperacillin-tazobactam (ZOSYN) IVPB 3.375 g     3.375 g 12.5 mL/hr over 240 Minutes Intravenous Every 8 hours 07/06/13 0854     07/06/13 1400  vancomycin (VANCOCIN) IVPB 1000 mg/200 mL premix     1,000 mg 200 mL/hr over 60 Minutes Intravenous Every 8 hours 07/06/13 0854     07/06/13 1100  piperacillin-tazobactam (ZOSYN) IVPB 3.375 g  Status:  Discontinued     3.375 g 12.5 mL/hr over 240 Minutes Intravenous Every 8 hours 07/06/13 0853 07/06/13 0854   07/06/13 1000  vancomycin (VANCOCIN) IVPB 1000 mg/200 mL premix  Status:  Discontinued     1,000 mg 200 mL/hr over 60 Minutes Intravenous Every 8 hours 07/06/13 0853 07/06/13 0854   07/06/13 0530  piperacillin-tazobactam (ZOSYN) IVPB 3.375 g     3.375 g 100 mL/hr over 30 Minutes Intravenous  Once 07/06/13 0526 07/06/13 0653   07/06/13 0530  vancomycin (VANCOCIN) IVPB 1000 mg/200 mL premix     1,000 mg 200 mL/hr over 60 Minutes Intravenous  Once 07/06/13 0526 07/06/13 0653     Antibiotics Given (last 72 hours)   Date/Time Action Medication Dose Rate   07/06/13 1523 Given   vancomycin (VANCOCIN) IVPB 1000 mg/200 mL premix 1,000 mg 200 mL/hr   07/06/13 1524 Given   piperacillin-tazobactam (ZOSYN) IVPB  3.375 g 3.375 g 12.5 mL/hr   07/06/13 2221 Given   vancomycin (VANCOCIN) IVPB 1000 mg/200 mL premix 1,000 mg 200 mL/hr   07/06/13 2357 Given   piperacillin-tazobactam (ZOSYN) IVPB 3.375 g 3.375 g 12.5 mL/hr   07/07/13 0531 Given   vancomycin (VANCOCIN) IVPB 1000 mg/200 mL premix 1,000 mg 200 mL/hr   07/07/13 0657 Given   piperacillin-tazobactam (ZOSYN) IVPB 3.375 g 3.375 g 12.5 mL/hr   07/07/13 1410 Given   vancomycin (VANCOCIN) IVPB 1000 mg/200 mL premix 1,000 mg 200 mL/hr      HPI/Subjective: - complains of left shoulder pain. No complaints about his breathing.   Objective: Filed Vitals:   07/06/13 0630 07/06/13 0809 07/06/13 1424 07/07/13 1409  BP: 116/69 114/71 120/68   Pulse: 93 88 91   Temp:  98.2 F (36.8 C) 98.5 F (36.9 C)   TempSrc:   Oral   Resp: 16 18 18    Weight:    86.002 kg (189 lb 9.6 oz)  SpO2: 96% 94% 96%     Intake/Output Summary (Last 24 hours) at 07/07/13 1541 Last data filed at 07/07/13 1000  Gross per 24 hour  Intake    965 ml  Output   3300 ml  Net  -2335 ml   Filed Weights   07/07/13 1409  Weight: 86.002 kg (189 lb 9.6 oz)   Exam:  General: No apparent distress Eyes: no scleral icterus ENT:  moist oropharynx Neck: supple, no JVD Cardiovascular: regular rate without MRG; 2+ peripheral pulses Respiratory: no wheezing, scattered rhonchi Abdomen: soft, non tender to palpation, positive bowel sounds, no guarding, no rebound; PEG tube in place, mild cellulitic changes around insertion site. Skin: no rashes Musculoskeletal: no peripheral edema Psychiatric: normal mood and affect Neurologic: alert, follows commands, answers yes/no. Moves all 4, weak 4/5   Data Reviewed: Basic Metabolic Panel:  Recent Labs Lab 07/06/13 0325 07/07/13 0435  NA 131* 135*  K 4.6 3.5*  CL 91* 96  CO2 32 27  GLUCOSE 101* 110*  BUN 13 11  CREATININE 0.58 0.51  CALCIUM 8.4 8.6   Liver Function Tests:  Recent Labs Lab 07/06/13 0325  AST 24  ALT 16  ALKPHOS  81  BILITOT <0.2*  PROT 6.8  ALBUMIN 3.4*   CBC:  Recent Labs Lab 07/06/13 0325 07/07/13 0435  WBC 8.6 7.3  NEUTROABS 6.8  --   HGB 15.6 15.1  HCT 47.3 45.8  MCV 95.9 94.0  PLT 132* 120*   Cardiac Enzymes:  Recent Labs Lab 07/06/13 1121 07/06/13 1616 07/06/13 2358  TROPONINI <0.30 <0.30 <0.30   CBG:  Recent Labs Lab 07/06/13 1243 07/06/13 1718 07/06/13 2356 07/07/13 0703 07/07/13 1130  GLUCAP 74 94 121* 110* 106*    Recent Results (from the past 240 hour(s))  CULTURE, BLOOD (ROUTINE X 2)     Status: None   Collection Time    07/06/13  3:15 AM      Result Value Range Status   Specimen Description BLOOD LEFT ARM   Final   Special Requests BOTTLES DRAWN AEROBIC AND ANAEROBIC 7CC EA   Final   Culture  Setup Time     Final   Value: 07/06/2013 13:49     Performed at [facility]   Culture     Final   Value:        BLOOD CULTURE RECEIVED NO GROWTH TO DATE CULTURE WILL BE HELD FOR 5 DAYS BEFORE ISSUING A FINAL NEGATIVE REPORT     Performed at [facility]   Report Status PENDING   Incomplete  CULTURE, BLOOD (ROUTINE X 2)     Status: None   Collection Time    07/06/13  3:25 AM      Result Value Range Status   Specimen Description BLOOD RIGHT ARM   Final   Special Requests BOTTLES DRAWN AEROBIC ONLY 3CC   Final   Culture  Setup Time     Final   Value: 07/06/2013 13:49     Performed at [facility]   Culture     Final   Value:        BLOOD CULTURE RECEIVED NO GROWTH TO DATE CULTURE WILL BE HELD FOR 5 DAYS BEFORE ISSUING A FINAL NEGATIVE REPORT     Performed at [facility]   Report Status PENDING   Incomplete     Studies: Dg Chest Portable 1 View  07/06/2013   CLINICAL DATA:  Chest pain  EXAM: PORTABLE CHEST - 1 VIEW  COMPARISON:  Prior radiograph from 06/11/2013  FINDINGS: The cardiac and mediastinal silhouettes are stable in size and contour, and remain within normal limits.  The lungs are hypoinflated. There are patchy left  basilar opacities, which may reflect atelectasis or possibly infiltrate. Mild right basilar atelectasis is also present. Diffuse pulmonary vascular congestion is present without frank pulmonary edema. No pneumothorax.  Remote fracture/malunion and/or AC joint separation of the  distal left clavicle is stable as compared to prior exam. Remote right-sided rib fractures are stable. No acute osseous abnormality. Spinal fixation hardware overlies the cervical spine.  IMPRESSION: 1. Patchy left basilar opacity, which may reflect atelectatic changes, edema, or possibly infiltrate. 2. Diffuse pulmonary vascular congestion with interstitial thickening, suggestive of pulmonary interstitial edema.   Electronically Signed   By: Rise MuBenjamin  McClintock M.D.   On: 07/06/2013 05:17   Dg Shoulder Left  07/07/2013   CLINICAL DATA:  Pain, fall  EXAM: LEFT SHOULDER - 2+ VIEW  COMPARISON:  None.  FINDINGS: Limited exam because of positioning. Chronic changes of the distal clavicle. No definite acute fracture. Fixation screw noted in the coracoid process.  IMPRESSION: Stable chronic and postoperative findings. No acute finding by plain radiography.   Electronically Signed   By: Ruel Favorsrevor  Shick M.D.   On: 07/07/2013 09:53    Scheduled Meds: . allopurinol  300 mg Per Tube Daily  . citalopram  40 mg Per Tube Daily  . enoxaparin (LOVENOX) injection  40 mg Subcutaneous Q24H  . free water  250 mL Per Tube Q6H  . gabapentin  900 mg Oral Daily   And  . gabapentin  600 mg Oral QHS  . insulin aspart  0-9 Units Subcutaneous TID WC  . lacosamide  100 mg Oral BID  . pantoprazole sodium  40 mg Per Tube BID  . piperacillin-tazobactam (ZOSYN)  IV  3.375 g Intravenous Q8H  . polyethylene glycol  17 g Oral Daily  . topiramate  150 mg Oral Daily   And  . topiramate  100 mg Oral QHS  . traZODone  50 mg Per Tube QHS  . vancomycin  1,000 mg Intravenous Q8H  . venlafaxine XR  37.5 mg Oral Q breakfast   Continuous Infusions: . feeding  supplement (JEVITY 1.5 CAL)    . free water     Principal Problem:   HCAP (healthcare-associated pneumonia) Active Problems:   Seizure   Hypercholesterolemia   Cervical myelopathy   Depression   Unspecified constipation  Time spent: 25  Pamella Pertostin Shamarr Faucett, MD [facility] Pager [phone]. If 7 PM - 7 AM, please contact night-coverage at [email or web address], password [email or web address] 07/07/2013, 3:41 PM  LOS: 1 day
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
## 2013-07-08 LAB — BASIC METABOLIC PANEL
BUN: 10 mg/dL (ref 6–23)
CO2: 27 meq/L (ref 19–32)
CREATININE: 0.55 mg/dL (ref 0.50–1.35)
Calcium: 8.4 mg/dL (ref 8.4–10.5)
Chloride: 97 mEq/L (ref 96–112)
GFR calc Af Amer: >90 mL/min (ref >90)
Glucose, Bld: 128 mg/dL — ABNORMAL HIGH (ref 70–99)
Potassium: 3.1 mEq/L — ABNORMAL LOW (ref 3.7–5.3)
Sodium: 137 mEq/L (ref 137–147)

## 2013-07-08 LAB — GLUCOSE, CAPILLARY
GLUCOSE-CAPILLARY: 115 mg/dL — AB (ref 70–99)
GLUCOSE-CAPILLARY: 140 mg/dL — AB (ref 70–99)
GLUCOSE-CAPILLARY: 121 mg/dL — AB (ref 70–99)
Glucose-Capillary: 105 mg/dL — ABNORMAL HIGH (ref 70–99)
Glucose-Capillary: 123 mg/dL — ABNORMAL HIGH (ref 70–99)
Glucose-Capillary: 158 mg/dL — ABNORMAL HIGH (ref 70–99)

## 2013-07-08 LAB — LEGIONELLA ANTIGEN, URINE: LEGIONELLA ANTIGEN, URINE: NEGATIVE

## 2013-07-08 MED ORDER — WHITE PETROLATUM GEL
Status: AC
Start: 2013-07-08 — End: 2013-07-08
  Administered 2013-07-08: 0.2
  Filled 2013-07-08: qty 5

## 2013-07-08 MED ORDER — POTASSIUM CHLORIDE 20 MEQ/15ML (10%) PO LIQD
30.0000 meq | ORAL | Status: AC
  Administered 2013-07-08 (×2): 30 meq via ORAL
  Filled 2013-07-08 (×2): qty 22.5

## 2013-07-08 NOTE — Progress Notes (Signed)
PROGRESS NOTE  Alexander [name] ZOX:[id number] DOB: April 27, 1956 DOA: 07/06/2013 PCP: Dema Severin, NP  Assessment/Plan: HCAP - with fever, tachypnea, tachycardia and CXR with possible PNA.  - Vanc/Zosyn given recent hospitalization.  - influenza negative  - recurrent aspiration  - We'll switch to oral antibiotics in the morning and wean off oxygen as tolerated. Chest pain  - troponin negative x 3 Cervical myelopathy sp fall in 2012 - continue home medications, PEG tube feeds.  PEG tube cellulitis - Abx for #1 covering.   Diet: NPO, tube feeds Fluids: none DVT Prophylaxis: Lovenox  Code Status: Full Family Communication: none  Disposition Plan: home when ready, likely one to 2 days  Consultants:  none  Procedures:  none   Antibiotics  Anti-infectives   Start     Dose/Rate Route Frequency Ordered Stop   07/06/13 1500  piperacillin-tazobactam (ZOSYN) IVPB 3.375 g     3.375 g 12.5 mL/hr over 240 Minutes Intravenous Every 8 hours 07/06/13 0854     07/06/13 1400  vancomycin (VANCOCIN) IVPB 1000 mg/200 mL premix     1,000 mg 200 mL/hr over 60 Minutes Intravenous Every 8 hours 07/06/13 0854     07/06/13 1100  piperacillin-tazobactam (ZOSYN) IVPB 3.375 g  Status:  Discontinued     3.375 g 12.5 mL/hr over 240 Minutes Intravenous Every 8 hours 07/06/13 0853 07/06/13 0854   07/06/13 1000  vancomycin (VANCOCIN) IVPB 1000 mg/200 mL premix  Status:  Discontinued     1,000 mg 200 mL/hr over 60 Minutes Intravenous Every 8 hours 07/06/13 0853 07/06/13 0854   07/06/13 0530  piperacillin-tazobactam (ZOSYN) IVPB 3.375 g     3.375 g 100 mL/hr over 30 Minutes Intravenous  Once 07/06/13 0526 07/06/13 0653   07/06/13 0530  vancomycin (VANCOCIN) IVPB 1000 mg/200 mL premix     1,000 mg 200 mL/hr over 60 Minutes Intravenous  Once 07/06/13 0526 07/06/13 0653     Antibiotics Given (last 72 hours)   Date/Time Action Medication Dose Rate   07/06/13 1523 Given   vancomycin (VANCOCIN) IVPB  1000 mg/200 mL premix 1,000 mg 200 mL/hr   07/06/13 1524 Given   piperacillin-tazobactam (ZOSYN) IVPB 3.375 g 3.375 g 12.5 mL/hr   07/06/13 2221 Given   vancomycin (VANCOCIN) IVPB 1000 mg/200 mL premix 1,000 mg 200 mL/hr   07/06/13 2357 Given   piperacillin-tazobactam (ZOSYN) IVPB 3.375 g 3.375 g 12.5 mL/hr   07/07/13 0531 Given   vancomycin (VANCOCIN) IVPB 1000 mg/200 mL premix 1,000 mg 200 mL/hr   07/07/13 0657 Given   piperacillin-tazobactam (ZOSYN) IVPB 3.375 g 3.375 g 12.5 mL/hr   07/07/13 1410 Given   vancomycin (VANCOCIN) IVPB 1000 mg/200 mL premix 1,000 mg 200 mL/hr   07/07/13 1633 Given   piperacillin-tazobactam (ZOSYN) IVPB 3.375 g 3.375 g 12.5 mL/hr   07/07/13 2212 Given   vancomycin (VANCOCIN) IVPB 1000 mg/200 mL premix 1,000 mg 200 mL/hr   07/08/13 0018 Given   piperacillin-tazobactam (ZOSYN) IVPB 3.375 g 3.375 g 12.5 mL/hr   07/08/13 0549 Given   vancomycin (VANCOCIN) IVPB 1000 mg/200 mL premix 1,000 mg 200 mL/hr   07/08/13 0549 Given   piperacillin-tazobactam (ZOSYN) IVPB 3.375 g 3.375 g 12.5 mL/hr   07/08/13 1411 Given   vancomycin (VANCOCIN) IVPB 1000 mg/200 mL premix 1,000 mg 200 mL/hr   07/08/13 1625 Given   piperacillin-tazobactam (ZOSYN) IVPB 3.375 g 3.375 g 12.5 mL/hr      HPI/Subjective: - States that his breathing is improved  Objective: [employer]  Vitals:   07/07/13 1500 07/07/13 2125 07/08/13 0604 07/08/13 1327  BP: 101/64 108/65 106/68 107/77  Pulse: 83 85 82 83  Temp: 98.3 F (36.8 C) 98.6 F (37 C) 98.6 F (37 C) 97.8 F (36.6 C)  TempSrc:      Resp: 18 18 18 16   Weight:      SpO2: 94% 99% 99% 99%    Intake/Output Summary (Last 24 hours) at 07/08/13 1725 Last data filed at 07/08/13 0730  Gross per 24 hour  Intake      0 ml  Output      0 ml  Net      0 ml   Filed Weights   07/07/13 1409  Weight: 86.002 kg (189 lb 9.6 oz)   Exam:  General: No apparent distress Eyes: no scleral icterus ENT: moist oropharynx Neck: supple, no JVD  Cardiovascular: regular rate without MRG; 2+ peripheral pulses Respiratory: no wheezing, scattered rhonchi Abdomen: soft, non tender to palpation, positive bowel sounds, no guarding, no rebound; PEG tube in place, mild cellulitic changes around insertion site. Skin: no rashes Musculoskeletal: no peripheral edema Psychiatric: normal mood and affect Neurologic: alert, follows commands, answers yes/no. Moves all 4, weak 4/5   Data Reviewed: Basic Metabolic Panel:  Recent Labs Lab 07/06/13 0325 07/07/13 0435 07/08/13 0530  NA 131* 135* 137  K 4.6 3.5* 3.1*  CL 91* 96 97  CO2 32 27 27  GLUCOSE 101* 110* 128*  BUN 13 11 10   CREATININE 0.58 0.51 0.55  CALCIUM 8.4 8.6 8.4   Liver Function Tests:  Recent Labs Lab 07/06/13 0325  AST 24  ALT 16  ALKPHOS 81  BILITOT <0.2*  PROT 6.8  ALBUMIN 3.4*   CBC:  Recent Labs Lab 07/06/13 0325 07/07/13 0435  WBC 8.6 7.3  NEUTROABS 6.8  --   HGB 15.6 15.1  HCT 47.3 45.8  MCV 95.9 94.0  PLT 132* 120*   Cardiac Enzymes:  Recent Labs Lab 07/06/13 1121 07/06/13 1616 07/06/13 2358  TROPONINI <0.30 <0.30 <0.30   CBG:  Recent Labs Lab 07/08/13 0013 07/08/13 0422 07/08/13 0753 07/08/13 1146 07/08/13 1601  GLUCAP 123* 105* 158* 115* 140*    Recent Results (from the past 240 hour(s))  CULTURE, BLOOD (ROUTINE X 2)     Status: None   Collection Time    07/06/13  3:15 AM      Result Value Range Status   Specimen Description BLOOD LEFT ARM   Final   Special Requests BOTTLES DRAWN AEROBIC AND ANAEROBIC 7CC EA   Final   Culture  Setup Time     Final   Value: 07/06/2013 13:49     Performed at [facility]   Culture     Final   Value:        BLOOD CULTURE RECEIVED NO GROWTH TO DATE CULTURE WILL BE HELD FOR 5 DAYS BEFORE ISSUING A FINAL NEGATIVE REPORT     Performed at [facility]   Report Status PENDING   Incomplete  CULTURE, BLOOD (ROUTINE X 2)     Status: None   Collection Time    07/06/13  3:25 AM       Result Value Range Status   Specimen Description BLOOD RIGHT ARM   Final   Special Requests BOTTLES DRAWN AEROBIC ONLY 3CC   Final   Culture  Setup Time     Final   Value: 07/06/2013 13:49     Performed at [facility]   Culture     Final   Value:        BLOOD CULTURE RECEIVED NO GROWTH TO DATE CULTURE WILL BE HELD FOR 5 DAYS BEFORE ISSUING A FINAL NEGATIVE REPORT     [facility]   Report Status PENDING   Incomplete     Studies: Dg Shoulder Left  07/07/2013   CLINICAL DATA:  Pain, fall  EXAM: LEFT SHOULDER - 2+ VIEW  COMPARISON:  None.  FINDINGS: Limited exam because of positioning. Chronic changes of the distal clavicle. No definite acute fracture. Fixation screw noted in the coracoid process.  IMPRESSION: Stable chronic and postoperative findings. No acute finding by plain radiography.   Electronically Signed   By: Ruel Favorsrevor  Shick M.D.   On: 07/07/2013 09:53    Scheduled Meds: . allopurinol  300 mg Per Tube Daily  . citalopram  40 mg Per Tube Daily  . enoxaparin (LOVENOX) injection  40 mg Subcutaneous Q24H  . free water  250 mL Per Tube Q6H  . gabapentin  900 mg Oral Daily   And  . gabapentin  600 mg Oral QHS  . insulin aspart  0-9 Units Subcutaneous TID WC  . lacosamide  100 mg Oral BID  . pantoprazole sodium  40 mg Per Tube BID  . piperacillin-tazobactam (ZOSYN)  IV  3.375 g Intravenous Q8H  . polyethylene glycol  17 g Oral Daily  . topiramate  150 mg Oral Daily   And  . topiramate  100 mg Oral QHS  . traZODone  50 mg Per Tube QHS  . vancomycin  1,000 mg Intravenous Q8H  . venlafaxine XR  37.5 mg Oral Q breakfast   Continuous Infusions: . feeding supplement (JEVITY 1.5 CAL) 1,000 mL (07/07/13 2345)  . free water     Principal Problem:   HCAP (healthcare-associated pneumonia) Active Problems:   Seizure   Hypercholesterolemia   Cervical myelopathy   Depression   Unspecified constipation  Time spent: 25  Pamella Pertostin Karina Nofsinger, MD [facility] Pager [phone]. If 7 PM - 7 AM, please contact night-coverage at [email or web address], password [email or web address] 07/08/2013, 5:25 PM  LOS: 2 days

## 2013-07-08 NOTE — Progress Notes (Signed)
Patient tested negative for the Flu, therefore the Droplet Precautions were D/C'd per protocol.

## 2013-07-09 ENCOUNTER — Inpatient Hospital Stay (HOSPITAL_COMMUNITY): Payer: Medicare Other

## 2013-07-09 LAB — GLUCOSE, CAPILLARY
GLUCOSE-CAPILLARY: 122 mg/dL — AB (ref 70–99)
GLUCOSE-CAPILLARY: 128 mg/dL — AB (ref 70–99)
Glucose-Capillary: 101 mg/dL — ABNORMAL HIGH (ref 70–99)
Glucose-Capillary: 104 mg/dL — ABNORMAL HIGH (ref 70–99)
Glucose-Capillary: 110 mg/dL — ABNORMAL HIGH (ref 70–99)
Glucose-Capillary: 116 mg/dL — ABNORMAL HIGH (ref 70–99)
Glucose-Capillary: 92 mg/dL (ref 70–99)

## 2013-07-09 LAB — BASIC METABOLIC PANEL
BUN: 12 mg/dL (ref 6–23)
CHLORIDE: 102 meq/L (ref 96–112)
CO2: 25 meq/L (ref 19–32)
Calcium: 8.7 mg/dL (ref 8.4–10.5)
Creatinine, Ser: 0.55 mg/dL (ref 0.50–1.35)
GFR calc non Af Amer: >90 mL/min (ref >90)
Glucose, Bld: 121 mg/dL — ABNORMAL HIGH (ref 70–99)
POTASSIUM: 3.7 meq/L (ref 3.7–5.3)
Sodium: 138 mEq/L (ref 137–147)

## 2013-07-09 MED ORDER — VENLAFAXINE HCL 37.5 MG PO TABS
37.5000 mg | ORAL_TABLET | Freq: Every day | ORAL | Status: DC
  Administered 2013-07-09 – 2013-07-14 (×6): 37.5 mg via NASOGASTRIC
  Filled 2013-07-09 (×6): qty 1
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
## 2013-07-09 NOTE — Progress Notes (Signed)
PROGRESS NOTE  Alexander [name] ZOX:[id number] DOB: June 25, 1956 DOA: 07/06/2013 PCP: Dema Severin, NP  Assessment/Plan: HCAP - with fever, tachypnea, tachycardia and CXR with possible PNA.  - Vanc/Zosyn given recent hospitalization.  - influenza negative  - recurrent aspiration  - We'll switch to oral antibiotics in the morning and wean off oxygen as tolerated. Chest pain  - troponin negative x 3 Cervical myelopathy sp fall in 2012 - continue home medications, PEG tube feeds.  PEG tube cellulitis - Abx for #1 covering.   Diet: NPO, tube feeds Fluids: none DVT Prophylaxis: Lovenox  Code Status: Full Family Communication: none  Disposition Plan: home when ready, likely one to 2 days  Consultants:  none  Procedures:  none   Antibiotics  Anti-infectives   Start     Dose/Rate Route Frequency Ordered Stop   07/06/13 1500  piperacillin-tazobactam (ZOSYN) IVPB 3.375 g     3.375 g 12.5 mL/hr over 240 Minutes Intravenous Every 8 hours 07/06/13 0854     07/06/13 1400  vancomycin (VANCOCIN) IVPB 1000 mg/200 mL premix     1,000 mg 200 mL/hr over 60 Minutes Intravenous Every 8 hours 07/06/13 0854     07/06/13 1100  piperacillin-tazobactam (ZOSYN) IVPB 3.375 g  Status:  Discontinued     3.375 g 12.5 mL/hr over 240 Minutes Intravenous Every 8 hours 07/06/13 0853 07/06/13 0854   07/06/13 1000  vancomycin (VANCOCIN) IVPB 1000 mg/200 mL premix  Status:  Discontinued     1,000 mg 200 mL/hr over 60 Minutes Intravenous Every 8 hours 07/06/13 0853 07/06/13 0854   07/06/13 0530  piperacillin-tazobactam (ZOSYN) IVPB 3.375 g     3.375 g 100 mL/hr over 30 Minutes Intravenous  Once 07/06/13 0526 07/06/13 0653   07/06/13 0530  vancomycin (VANCOCIN) IVPB 1000 mg/200 mL premix     1,000 mg 200 mL/hr over 60 Minutes Intravenous  Once 07/06/13 0526 07/06/13 0653     Antibiotics Given (last 72 hours)   Date/Time Action Medication Dose Rate   07/06/13 2221 Given   vancomycin (VANCOCIN) IVPB  1000 mg/200 mL premix 1,000 mg 200 mL/hr   07/06/13 2357 Given   piperacillin-tazobactam (ZOSYN) IVPB 3.375 g 3.375 g 12.5 mL/hr   07/07/13 0531 Given   vancomycin (VANCOCIN) IVPB 1000 mg/200 mL premix 1,000 mg 200 mL/hr   07/07/13 0657 Given   piperacillin-tazobactam (ZOSYN) IVPB 3.375 g 3.375 g 12.5 mL/hr   07/07/13 1410 Given   vancomycin (VANCOCIN) IVPB 1000 mg/200 mL premix 1,000 mg 200 mL/hr   07/07/13 1633 Given   piperacillin-tazobactam (ZOSYN) IVPB 3.375 g 3.375 g 12.5 mL/hr   07/07/13 2212 Given   vancomycin (VANCOCIN) IVPB 1000 mg/200 mL premix 1,000 mg 200 mL/hr   07/08/13 0018 Given   piperacillin-tazobactam (ZOSYN) IVPB 3.375 g 3.375 g 12.5 mL/hr   07/08/13 0549 Given   vancomycin (VANCOCIN) IVPB 1000 mg/200 mL premix 1,000 mg 200 mL/hr   07/08/13 0549 Given   piperacillin-tazobactam (ZOSYN) IVPB 3.375 g 3.375 g 12.5 mL/hr   07/08/13 1411 Given   vancomycin (VANCOCIN) IVPB 1000 mg/200 mL premix 1,000 mg 200 mL/hr   07/08/13 1625 Given   piperacillin-tazobactam (ZOSYN) IVPB 3.375 g 3.375 g 12.5 mL/hr   07/08/13 2245 Given   vancomycin (VANCOCIN) IVPB 1000 mg/200 mL premix 1,000 mg 200 mL/hr   07/08/13 2354 Given   piperacillin-tazobactam (ZOSYN) IVPB 3.375 g 3.375 g 12.5 mL/hr   07/09/13 0541 Given   vancomycin (VANCOCIN) IVPB 1000 mg/200 mL premix 1,000 mg  200 mL/hr   07/09/13 0700 Given   piperacillin-tazobactam (ZOSYN) IVPB 3.375 g 3.375 g 12.5 mL/hr   07/09/13 1441 Given   vancomycin (VANCOCIN) IVPB 1000 mg/200 mL premix 1,000 mg 200 mL/hr   07/09/13 1600 Given   piperacillin-tazobactam (ZOSYN) IVPB 3.375 g 3.375 g 12.5 mL/hr      HPI/Subjective: Wants to go home.   Objective: Filed Vitals:   07/08/13 2034 07/09/13 0451 07/09/13 0500 07/09/13 1630  BP: 125/68 100/67    Pulse: 87 92  87  Temp: 97.8 F (36.6 C) 98.6 F (37 C)    TempSrc: Oral Oral    Resp: 16 16  16   Weight:   83.689 kg (184 lb 8 oz)   SpO2: 93% 94%  95%    Intake/Output Summary  (Last 24 hours) at 07/09/13 1821 Last data filed at 07/09/13 1755  Gross per 24 hour  Intake   1550 ml  Output    700 ml  Net    850 ml   Filed Weights   07/07/13 1409 07/08/13 1700 07/09/13 0500  Weight: 86.002 kg (189 lb 9.6 oz) 83.825 kg (184 lb 12.8 oz) 83.689 kg (184 lb 8 oz)   Exam:  General: No apparent distress Eyes: no scleral icterus ENT: moist oropharynx Neck: supple, no JVD Cardiovascular: regular rate without MRG; 2+ peripheral pulses Respiratory: no wheezing, scattered rhonchi Abdomen: soft, non tender to palpation, positive bowel sounds, no guarding, no rebound; PEG tube in place, mild cellulitic changes around insertion site. Skin: no rashes Musculoskeletal: no peripheral edema Psychiatric: normal mood and affect Neurologic: alert, follows commands, answers yes/no. Moves all 4, weak 4/5   Data Reviewed: Basic Metabolic Panel:  Recent Labs Lab 07/06/13 0325 07/07/13 0435 07/08/13 0530 07/09/13 0310  NA 131* 135* 137 138  K 4.6 3.5* 3.1* 3.7  CL 91* 96 97 102  CO2 32 27 27 25   GLUCOSE 101* 110* 128* 121*  BUN 13 11 10 12   CREATININE 0.58 0.51 0.55 0.55  CALCIUM 8.4 8.6 8.4 8.7   Liver Function Tests:  Recent Labs Lab 07/06/13 0325  AST 24  ALT 16  ALKPHOS 81  BILITOT <0.2*  PROT 6.8  ALBUMIN 3.4*   CBC:  Recent Labs Lab 07/06/13 0325 07/07/13 0435  WBC 8.6 7.3  NEUTROABS 6.8  --   HGB 15.6 15.1  HCT 47.3 45.8  MCV 95.9 94.0  PLT 132* 120*   Cardiac Enzymes:  Recent Labs Lab 07/06/13 1121 07/06/13 1616 07/06/13 2358  TROPONINI <0.30 <0.30 <0.30   CBG:  Recent Labs Lab 07/09/13 0016 07/09/13 0453 07/09/13 0900 07/09/13 1119 07/09/13 1658  GLUCAP 116* 122* 128* 110* 104*    Recent Results (from the past 240 hour(s))  CULTURE, BLOOD (ROUTINE X 2)     Status: None   Collection Time    07/06/13  3:15 AM      Result Value Range Status   Specimen Description BLOOD LEFT ARM   Final   Special Requests BOTTLES DRAWN AEROBIC AND  ANAEROBIC 7CC EA   Final   Culture  Setup Time     Final   Value: 07/06/2013 13:49     Performed at [facility]   Culture     Final   Value:        BLOOD CULTURE RECEIVED NO GROWTH TO DATE CULTURE WILL BE HELD FOR 5 DAYS BEFORE ISSUING A FINAL NEGATIVE REPORT     Performed at [facility]  Report Status PENDING   Incomplete  CULTURE, BLOOD (ROUTINE X 2)     Status: None   Collection Time    07/06/13  3:25 AM      Result Value Range Status   Specimen Description BLOOD RIGHT ARM   Final   Special Requests BOTTLES DRAWN AEROBIC ONLY 3CC   Final   Culture  Setup Time     Final   Value: 07/06/2013 13:49     Performed at [facility]   Culture     Final   Value:        BLOOD CULTURE RECEIVED NO GROWTH TO DATE CULTURE WILL BE HELD FOR 5 DAYS BEFORE ISSUING A FINAL NEGATIVE REPORT     Performed at [facility]   Report Status PENDING   Incomplete     Studies: Dg Chest Port 1 View  07/09/2013   CLINICAL DATA:  Follow-up pneumonia  EXAM: PORTABLE CHEST - 1 VIEW  COMPARISON:  DG CHEST 1V PORT dated 07/06/2013; DG CHEST 2 VIEW dated 06/11/2013; DG CHEST 2 VIEW dated 03/30/2013; DG CHEST 2 VIEW dated 10/01/2012; DG CHEST PORTABLE dated 08/26/2012  FINDINGS: There is bibasilar airspace disease likely representing atelectasis. There is bilateral mild interstitial prominence likely related to low lung volumes. There is no pleural effusion or pneumothorax. Stable cardiomediastinal silhouette. There is evidence of posterior lower cervical spinal fusion.  IMPRESSION: Bibasilar airspace disease likely reflecting atelectasis.   Electronically Signed   By: Elige Ko   On: 07/09/2013 13:45    Scheduled Meds: . allopurinol  300 mg Per Tube Daily  . citalopram  40 mg Per Tube Daily  . enoxaparin (LOVENOX) injection  40 mg Subcutaneous Q24H  . free water  250 mL Per Tube Q6H  . gabapentin  900 mg Oral Daily   And  . gabapentin  600 mg Oral QHS  . insulin aspart  0-9 Units  Subcutaneous TID WC  . lacosamide  100 mg Oral BID  . pantoprazole sodium  40 mg Per Tube BID  . piperacillin-tazobactam (ZOSYN)  IV  3.375 g Intravenous Q8H  . polyethylene glycol  17 g Oral Daily  . topiramate  150 mg Oral Daily   And  . topiramate  100 mg Oral QHS  . traZODone  50 mg Per Tube QHS  . vancomycin  1,000 mg Intravenous Q8H  . venlafaxine  37.5 mg Per NG tube Daily   Continuous Infusions: . feeding supplement (JEVITY 1.5 CAL) 1,000 mL (07/09/13 0700)  . free water     Principal Problem:   HCAP (healthcare-associated pneumonia) Active Problems:   Seizure   Hypercholesterolemia   Cervical myelopathy   Depression   Unspecified constipation  Time spent: 25  Kathlen Mody MD [facility] Pager [phone]. If 7 PM - 7 AM, please contact night-coverage at [email or web address], password [facility] 07/09/2013, 6:21 PM  LOS: 3 days

## 2013-07-10 LAB — GLUCOSE, CAPILLARY
GLUCOSE-CAPILLARY: 70 mg/dL (ref 70–99)
Glucose-Capillary: 106 mg/dL — ABNORMAL HIGH (ref 70–99)
Glucose-Capillary: 107 mg/dL — ABNORMAL HIGH (ref 70–99)
Glucose-Capillary: 88 mg/dL (ref 70–99)
Glucose-Capillary: 97 mg/dL (ref 70–99)
Glucose-Capillary: 98 mg/dL (ref 70–99)

## 2013-07-10 MED ORDER — DEXTROSE 50 % IV SOLN
25.0000 mL | Freq: Once | INTRAVENOUS | Status: AC
  Administered 2013-07-10: 25 mL via INTRAVENOUS
  Filled 2013-07-10: qty 50

## 2013-07-10 NOTE — Progress Notes (Signed)
PROGRESS NOTE  Alexander [name] WJX:[id number] DOB: 04-04-1956 DOA: 07/06/2013 PCP: Dema Severin, NP  Assessment/Plan: HCAP - with fever, tachypnea, tachycardia and CXR with possible PNA.  - Vanc/Zosyn given recent hospitalization.  - influenza negative  - recurrent aspiration   Chest pain  - troponin negative x 3 Cervical myelopathy sp fall in 2012 - continue home medications, PEG tube feeds.  PEG tube cellulitis - Abx for #1 covering.   Diet: NPO, tube feeds Fluids: none DVT Prophylaxis: Lovenox  Code Status: Full Family Communication: discussed in detail witht hepatient's mom, .  Disposition Plan: pending PT eval.  Consultants:  none  Procedures:  none   Antibiotics  Anti-infectives   Start     Dose/Rate Route Frequency Ordered Stop   07/06/13 1500  piperacillin-tazobactam (ZOSYN) IVPB 3.375 g     3.375 g 12.5 mL/hr over 240 Minutes Intravenous Every 8 hours 07/06/13 0854     07/06/13 1400  vancomycin (VANCOCIN) IVPB 1000 mg/200 mL premix     1,000 mg 200 mL/hr over 60 Minutes Intravenous Every 8 hours 07/06/13 0854     07/06/13 1100  piperacillin-tazobactam (ZOSYN) IVPB 3.375 g  Status:  Discontinued     3.375 g 12.5 mL/hr over 240 Minutes Intravenous Every 8 hours 07/06/13 0853 07/06/13 0854   07/06/13 1000  vancomycin (VANCOCIN) IVPB 1000 mg/200 mL premix  Status:  Discontinued     1,000 mg 200 mL/hr over 60 Minutes Intravenous Every 8 hours 07/06/13 0853 07/06/13 0854   07/06/13 0530  piperacillin-tazobactam (ZOSYN) IVPB 3.375 g     3.375 g 100 mL/hr over 30 Minutes Intravenous  Once 07/06/13 0526 07/06/13 0653   07/06/13 0530  vancomycin (VANCOCIN) IVPB 1000 mg/200 mL premix     1,000 mg 200 mL/hr over 60 Minutes Intravenous  Once 07/06/13 0526 07/06/13 0653     Antibiotics Given (last 72 hours)   Date/Time Action Medication Dose Rate   07/07/13 2212 Given   vancomycin (VANCOCIN) IVPB 1000 mg/200 mL premix 1,000 mg 200 mL/hr   07/08/13 0018 Given     piperacillin-tazobactam (ZOSYN) IVPB 3.375 g 3.375 g 12.5 mL/hr   07/08/13 0549 Given   vancomycin (VANCOCIN) IVPB 1000 mg/200 mL premix 1,000 mg 200 mL/hr   07/08/13 0549 Given   piperacillin-tazobactam (ZOSYN) IVPB 3.375 g 3.375 g 12.5 mL/hr   07/08/13 1411 Given   vancomycin (VANCOCIN) IVPB 1000 mg/200 mL premix 1,000 mg 200 mL/hr   07/08/13 1625 Given   piperacillin-tazobactam (ZOSYN) IVPB 3.375 g 3.375 g 12.5 mL/hr   07/08/13 2245 Given   vancomycin (VANCOCIN) IVPB 1000 mg/200 mL premix 1,000 mg 200 mL/hr   07/08/13 2354 Given   piperacillin-tazobactam (ZOSYN) IVPB 3.375 g 3.375 g 12.5 mL/hr   07/09/13 0541 Given   vancomycin (VANCOCIN) IVPB 1000 mg/200 mL premix 1,000 mg 200 mL/hr   07/09/13 0700 Given   piperacillin-tazobactam (ZOSYN) IVPB 3.375 g 3.375 g 12.5 mL/hr   07/09/13 1441 Given   vancomycin (VANCOCIN) IVPB 1000 mg/200 mL premix 1,000 mg 200 mL/hr   07/09/13 1600 Given   piperacillin-tazobactam (ZOSYN) IVPB 3.375 g 3.375 g 12.5 mL/hr   07/09/13 2114 Given   vancomycin (VANCOCIN) IVPB 1000 mg/200 mL premix 1,000 mg 200 mL/hr   07/09/13 2244 Given   piperacillin-tazobactam (ZOSYN) IVPB 3.375 g 3.375 g 12.5 mL/hr   07/10/13 0532 Given   vancomycin (VANCOCIN) IVPB 1000 mg/200 mL premix 1,000 mg 200 mL/hr   07/10/13 0927 Given   piperacillin-tazobactam (ZOSYN) IVPB  3.375 g 3.375 g 12.5 mL/hr   07/10/13 1342 Given   vancomycin (VANCOCIN) IVPB 1000 mg/200 mL premix 1,000 mg 200 mL/hr   07/10/13 1533 Given   piperacillin-tazobactam (ZOSYN) IVPB 3.375 g 3.375 g 12.5 mL/hr      HPI/Subjective: Wants to go home.   Objective: Filed Vitals:   07/09/13 1630 07/09/13 2103 07/10/13 0542 07/10/13 1347  BP:  106/67 110/66 113/73  Pulse: 87 81 84 86  Temp:  98.7 F (37.1 C) 98.2 F (36.8 C) 98.4 F (36.9 C)  TempSrc:  Oral Oral   Resp: 16 16 16 18   Weight:   83.2 kg (183 lb 6.8 oz)   SpO2: 95% 96% 94% 99%    Intake/Output Summary (Last 24 hours) at 07/10/13  1828 Last data filed at 07/10/13 1816  Gross per 24 hour  Intake   2840 ml  Output   1950 ml  Net    890 ml   Filed Weights   07/08/13 1700 07/09/13 0500 07/10/13 0542  Weight: 83.825 kg (184 lb 12.8 oz) 83.689 kg (184 lb 8 oz) 83.2 kg (183 lb 6.8 oz)   Exam:  General: No apparent distress Eyes: no scleral icterus ENT: moist oropharynx Neck: supple, no JVD Cardiovascular: regular rate without MRG; 2+ peripheral pulses Respiratory: no wheezing, scattered rhonchi Abdomen: soft, non tender to palpation, positive bowel sounds, no guarding, no rebound; PEG tube in place, mild cellulitic changes around insertion site. Skin: no rashes Musculoskeletal: no peripheral edema Psychiatric: normal mood and affect Neurologic: alert, follows commands, answers yes/no. Moves all 4, weak 4/5   Data Reviewed: Basic Metabolic Panel:  Recent Labs Lab 07/06/13 0325 07/07/13 0435 07/08/13 0530 07/09/13 0310  NA 131* 135* 137 138  K 4.6 3.5* 3.1* 3.7  CL 91* 96 97 102  CO2 32 27 27 25   GLUCOSE 101* 110* 128* 121*  BUN 13 11 10 12   CREATININE 0.58 0.51 0.55 0.55  CALCIUM 8.4 8.6 8.4 8.7   Liver Function Tests:  Recent Labs Lab 07/06/13 0325  AST 24  ALT 16  ALKPHOS 81  BILITOT <0.2*  PROT 6.8  ALBUMIN 3.4*   CBC:  Recent Labs Lab 07/06/13 0325 07/07/13 0435  WBC 8.6 7.3  NEUTROABS 6.8  --   HGB 15.6 15.1  HCT 47.3 45.8  MCV 95.9 94.0  PLT 132* 120*   Cardiac Enzymes:  Recent Labs Lab 07/06/13 1121 07/06/13 1616 07/06/13 2358  TROPONINI <0.30 <0.30 <0.30   CBG:  Recent Labs Lab 07/09/13 2359 07/10/13 0402 07/10/13 1031 07/10/13 1345 07/10/13 1810  GLUCAP 92 97 107* 98 88    Recent Results (from the past 240 hour(s))  CULTURE, BLOOD (ROUTINE X 2)     Status: None   Collection Time    07/06/13  3:15 AM      Result Value Range Status   Specimen Description BLOOD LEFT ARM   Final   Special Requests BOTTLES DRAWN AEROBIC AND ANAEROBIC 7CC EA   Final   Culture   Setup Time     Final   Value: 07/06/2013 13:49     Performed at [facility]   Culture     Final   Value:        BLOOD CULTURE RECEIVED NO GROWTH TO DATE CULTURE WILL BE HELD FOR 5 DAYS BEFORE ISSUING A FINAL NEGATIVE REPORT     Performed at [facility]   Report Status PENDING   Incomplete  CULTURE, BLOOD (ROUTINE  X 2)     Status: None   Collection Time    07/06/13  3:25 AM      Result Value Range Status   Specimen Description BLOOD RIGHT ARM   Final   Special Requests BOTTLES DRAWN AEROBIC ONLY 3CC   Final   Culture  Setup Time     Final   Value: 07/06/2013 13:49     Performed at [facility]   Culture     Final   Value:        BLOOD CULTURE RECEIVED NO GROWTH TO DATE CULTURE WILL BE HELD FOR 5 DAYS BEFORE ISSUING A FINAL NEGATIVE REPORT     Performed at [facility]   Report Status PENDING   Incomplete     Studies: Dg Chest Port 1 View  07/09/2013   CLINICAL DATA:  Follow-up pneumonia  EXAM: PORTABLE CHEST - 1 VIEW  COMPARISON:  DG CHEST 1V PORT dated 07/06/2013; DG CHEST 2 VIEW dated 06/11/2013; DG CHEST 2 VIEW dated 03/30/2013; DG CHEST 2 VIEW dated 10/01/2012; DG CHEST PORTABLE dated 08/26/2012  FINDINGS: There is bibasilar airspace disease likely representing atelectasis. There is bilateral mild interstitial prominence likely related to low lung volumes. There is no pleural effusion or pneumothorax. Stable cardiomediastinal silhouette. There is evidence of posterior lower cervical spinal fusion.  IMPRESSION: Bibasilar airspace disease likely reflecting atelectasis.   Electronically Signed   By: Elige Ko   On: 07/09/2013 13:45    Scheduled Meds: . allopurinol  300 mg Per Tube Daily  . citalopram  40 mg Per Tube Daily  . enoxaparin (LOVENOX) injection  40 mg Subcutaneous Q24H  . free water  250 mL Per Tube Q6H  . gabapentin  900 mg Oral Daily   And  . gabapentin  600 mg Oral QHS  . insulin aspart  0-9 Units Subcutaneous TID WC  . lacosamide  100  mg Oral BID  . pantoprazole sodium  40 mg Per Tube BID  . piperacillin-tazobactam (ZOSYN)  IV  3.375 g Intravenous Q8H  . polyethylene glycol  17 g Oral Daily  . topiramate  150 mg Oral Daily   And  . topiramate  100 mg Oral QHS  . traZODone  50 mg Per Tube QHS  . vancomycin  1,000 mg Intravenous Q8H  . venlafaxine  37.5 mg Per NG tube Daily   Continuous Infusions: . feeding supplement (JEVITY 1.5 CAL) Stopped (07/10/13 0846)   Principal Problem:   HCAP (healthcare-associated pneumonia) Active Problems:   Seizure   Hypercholesterolemia   Cervical myelopathy   Depression   Unspecified constipation  Time spent: 25  Kathlen Mody MD [facility] Pager [phone]. If 7 PM - 7 AM, please contact night-coverage at [email or web address], password [facility] 07/10/2013, 6:28 PM  LOS: 4 days

## 2013-07-10 NOTE — Progress Notes (Signed)
07/10/13: Staff please follow tube feeding orders appropriately. Tube feeds start at 6pm each day and continue for 13 hours at 100mL per hour until 7am the next morning. So basically this means the tube feeds run during night shift. RN called MD to clarify this order today. MD confirmed to run tube feeds at night and only for 13 hours a day. MD also placed an order to consult dietician to follow up, so this order may change after 07/10/2013. But for night shift beginning 07/10/2013, this should be what is followed. Thank you

## 2013-07-10 NOTE — Evaluation (Signed)
Physical Therapy Evaluation Patient Details Name: Alexander [name] MRN: [id number] DOB: May 03, 1956 Today's Date: 07/10/2013 Time: 7829-5621 PT Time Calculation (min): 22 min  PT Assessment / Plan / Recommendation History of Present Illness    Alexander [name] is a 58 y.o. male has a past medical history significant for CNS degenerative disease, prior CVAs, DM, HTN, recurrent aspiration pneumonias, prior C1 fracture, presents to the ED with shortness of breath and left sided chest pain and fevers. History obtained per chart review as patient is minimally verbal. He is alert and can nod yes/no to some questions.    Clinical Impression  Pt adm due to the above. Presents with decreased independence with functional mobility secondary to deficits listed below (see PT problem list). Pt to benefit from acute skilled PT to address deficits listed below and increase independence with mobility. Spoke with mother, who is his emergency contact via telephone, mother reports the pt has had multiple recent falls at home when transferring. Mother reports pt does not have 24/7 (A) regularly and would be agreeable to SNF upon acute D/C to increase strength and mobility prior to returning home. Pt was able to (A) with sit to stand, SPT with RW but has become deconditioned and requires (A) at this time that the mother cannot provide.     PT Assessment  Patient needs continued PT services    Follow Up Recommendations  SNF;Supervision/Assistance - 24 hour    Does the patient have the potential to tolerate intense rehabilitation      Barriers to Discharge Decreased caregiver support;Inaccessible home environment pt needs to be more independent prior to returning home with 96 y.o. mother    Equipment Recommendations  Other (comment) (TBD)    Recommendations for Other Services OT consult   Frequency Min 2X/week    Precautions / Restrictions Precautions Precautions: Fall Precaution Comments: mother reports  multiple recent falls with transfers  Restrictions Weight Bearing Restrictions: No   Pertinent Vitals/Pain VSS.     Mobility  Bed Mobility Overal bed mobility: Needs Assistance Bed Mobility: Supine to Sit Supine to sit: Total assist;HOB elevated General bed mobility comments: pt required total (A) for all bed mobility; able to minimally initate sit to supine with max multimodal cues; has difficulty following one step commands; became very fatigued and 3/4 dyspnea with exertion; use of draw pad to bring hips to EOB and then to elevate trunk          PT Diagnosis: Difficulty walking;Generalized weakness  PT Problem List: Decreased strength;Decreased activity tolerance;Decreased balance;Decreased mobility;Decreased cognition;Decreased knowledge of use of DME;Cardiopulmonary status limiting activity PT Treatment Interventions: DME instruction;Functional mobility training;Therapeutic activities;Therapeutic exercise;Balance training;Neuromuscular re-education;Patient/family education;Wheelchair mobility training     PT Goals(Current goals can be found in the care plan section) Acute Rehab PT Goals Patient Stated Goal: none stated PT Goal Formulation: With patient/family Time For Goal Achievement: 07/24/13 Potential to Achieve Goals: Fair  Visit Information  Last PT Received On: 07/10/13 Assistance Needed: +2       Prior Functioning  Home Living Family/patient expects to be discharged to:: Private residence Living Arrangements: Alone Available Help at Discharge: Family;Available PRN/intermittently;Personal care attendant;Other (Comment) (family reports this is inconsistent ) Type of Home: House Home Access: Level entry Home Layout: One level Home Equipment: Wheelchair - [employer] - 2 wheels Additional Comments: pt unable to provide history, mother telephoned and informed PT that pt has been requiring 24/7 (A) from nurse and from personal paid "family friend". mother reports pt  has declined in health recently and become weaker. reports multiple falls recently. is willing to (A) pt return home if he can return to baseline  Prior Function Level of Independence: Needs assistance ADL's / Homemaking Assistance Needed: total (A) Comments: mother reports pt was able to stand with "some" (A) and RW and take pivotal steps to wheelchair until recently Communication Communication: Other (comment) (impaired communication)    Cognition  Cognition Arousal/Alertness: Awake/alert Behavior During Therapy: Flat affect Overall Cognitive Status: Difficult to assess Difficult to assess due to: Impaired communication    Extremity/Trunk Assessment Upper Extremity Assessment Upper Extremity Assessment: Defer to OT evaluation Lower Extremity Assessment Lower Extremity Assessment: Generalized weakness Cervical / Trunk Assessment Cervical / Trunk Assessment: Kyphotic   Balance Balance Overall balance assessment: Needs assistance;History of Falls Sitting-balance support: Feet supported;Bilateral upper extremity supported Sitting balance-Leahy Scale: Poor Dynamic Sitting - Comments: tolerated sitting EOB ~5 min  Postural control: Left lateral lean;Posterior lean  End of Session PT - End of Session Equipment Utilized During Treatment: Gait belt;Oxygen (1L O2) Activity Tolerance: Patient limited by fatigue Patient left: in bed;with call bell/phone within reach;with nursing/sitter in room Nurse Communication: Mobility status;Precautions;Need for lift equipment  GP     Donell SievertWest, Mildred Bollard N, [place] [phone] 07/10/2013, 4:36 PM

## 2013-07-11 LAB — GLUCOSE, CAPILLARY
GLUCOSE-CAPILLARY: 124 mg/dL — AB (ref 70–99)
Glucose-Capillary: 114 mg/dL — ABNORMAL HIGH (ref 70–99)
Glucose-Capillary: 108 mg/dL — ABNORMAL HIGH (ref 70–99)
Glucose-Capillary: 80 mg/dL (ref 70–99)
Glucose-Capillary: 77 mg/dL (ref 70–99)

## 2013-07-11 MED ORDER — LEVOFLOXACIN 500 MG PO TABS
500.0000 mg | ORAL_TABLET | Freq: Every day | ORAL | Status: DC
  Administered 2013-07-11 – 2013-07-14 (×4): 500 mg via ORAL
  Filled 2013-07-11 (×4): qty 1

## 2013-07-11 MED ORDER — JEVITY 1.5 CAL PO LIQD
1000.0000 mL | ORAL | Status: DC
  Administered 2013-07-11 – 2013-07-13 (×3)
  Filled 2013-07-11 (×12): qty 1000

## 2013-07-11 NOTE — Progress Notes (Addendum)
Clinical Social Work Department CLINICAL SOCIAL WORK PLACEMENT NOTE 07/11/2013  Patient:  Alexander [name],[name]  Account Number:  [id number] Admit date:  07/06/2013  Clinical Social Worker:  Sharol HarnessPOONUM Josephine Wooldridge, Theresia MajorsLCSWA  Date/time:  07/11/2013 02:00 PM  Clinical Social Work is seeking post-discharge placement for this patient at the following level of care:   SKILLED NURSING   (*CSW will update this form in Epic as items are completed)   07/11/2013  Patient/family provided with [facility] Department of Clinical Social Work's list of facilities offering this level of care within the geographic area requested by the patient (or if unable, by the patient's family).  07/11/2013  Patient/family informed of their freedom to choose among providers that offer the needed level of care, that participate in Medicare, Medicaid or managed care program needed by the patient, have an available bed and are willing to accept the patient.  07/11/2013  Patient/family informed of [facility]' ownership interest in [facility], as well as of the fact that they are under no obligation to receive care at this facility.  PASARR submitted to EDS on EXISTING PASARR number received from EDS on   FL2 transmitted to all facilities in geographic area requested by pt/family on  07/11/2013 FL2 transmitted to all facilities within larger geographic area on   Patient informed that his/her managed care company has contracts with or will negotiate with  certain facilities, including the following:     Patient/family informed of bed offers received:  07/11/2013 [facility] and patient chooses bed at    Patient to be transferred to [facility] on  07/14/2013 [facility]  The following physician request were entered in Epic:   Additional Comments:  Roshun Klingensmith, LCSWA [phone]

## 2013-07-11 NOTE — Progress Notes (Signed)
CSW (Clinical Child psychotherapistocial Worker) spoke with pt mother and informed that [facility] is unable to offer a bed until Monday and per MD pt will be likely to dc tomorrow. Pt mother really wanting [facility] but was [facility] if [facility].  Alita Waldren, LCSWA [phone]

## 2013-07-11 NOTE — Progress Notes (Signed)
NUTRITION FOLLOW UP/CONSULT  Intervention:   Jevity 1.5 @ 100 ml/hr x 13  250 ml H2O every 6 hours   NUTRITION DIAGNOSIS:  Inadequate oral intake related to inability to eat as evidenced by NPO status; ongoing   Goal:  Pt to meet >/= 90% of their estimated nutrition needs, met  Monitor:  TF tolerance, weight trend, labs  Assessment:   Pt admitted with SOB, CXR with possible health-care associated PNA. Pt from home with hx of TBI, multiple strokes, and recurrent aspiration PNA. Pt with PEG for nutrition. Pt with hx of constipation.   Current TF order Jevity 1.5 @ 100 ml/hr for 13 hours (18:00-07:00). 250 ml H2O every 6 hours.  Provides: 1950 kcal, 83 grams protein, and 988 ml H2O Total free water: 1988 ml  Residuals: 5-50 ml  Height: Ht Readings from Last 1 Encounters:  06/12/13 5' 9"  (1.753 m)    Weight Status:   Wt Readings from Last 1 Encounters:  07/11/13 191 lb 9.3 oz (86.9 kg)  Admission weight 189 lb (86 kg)  Re-estimated needs:  Kcal: 1900-2000  Protein: 80-90 grams  Fluid: > 1.9 L/day  Skin: skin tear left buttocks  Diet Order: NPO   Intake/Output Summary (Last 24 hours) at 07/11/13 0916 Last data filed at 07/11/13 0700  Gross per 24 hour  Intake   1500 ml  Output    850 ml  Net    650 ml    Last BM: 1/8   Labs:   Recent Labs Lab 07/07/13 0435 07/08/13 0530 07/09/13 0310  NA 135* 137 138  K 3.5* 3.1* 3.7  CL 96 97 102  CO2 27 27 25   BUN 11 10 12   CREATININE 0.51 0.55 0.55  CALCIUM 8.6 8.4 8.7  GLUCOSE 110* 128* 121*    CBG (last 3)   Recent Labs  07/10/13 2356 07/11/13 0432 07/11/13 0855  GLUCAP 106* 114* 124*   No results found for this basename: HGBA1C   Scheduled Meds: . allopurinol  300 mg Per Tube Daily  . citalopram  40 mg Per Tube Daily  . enoxaparin (LOVENOX) injection  40 mg Subcutaneous Q24H  . free water  250 mL Per Tube Q6H  . gabapentin  900 mg Oral Daily   And  . gabapentin  600 mg Oral QHS  . insulin  aspart  0-9 Units Subcutaneous TID WC  . lacosamide  100 mg Oral BID  . pantoprazole sodium  40 mg Per Tube BID  . piperacillin-tazobactam (ZOSYN)  IV  3.375 g Intravenous Q8H  . polyethylene glycol  17 g Oral Daily  . topiramate  150 mg Oral Daily   And  . topiramate  100 mg Oral QHS  . traZODone  50 mg Per Tube QHS  . vancomycin  1,000 mg Intravenous Q8H  . venlafaxine  37.5 mg Per NG tube Daily    Continuous Infusions: . feeding supplement (JEVITY 1.5 CAL)      Maylon Peppers RD, LDN, CNSC [phone] Pager [phone] After Hours Pager

## 2013-07-11 NOTE — Progress Notes (Signed)
PROGRESS NOTE  Alexander [name] KGM:[id number] DOB: 10-28-1955 DOA: 07/06/2013 PCP: Dema Severin, NP  Assessment/Plan: HCAP - with fever, tachypnea, tachycardia and CXR with possible PNA.  - Vanc/Zosyn given recent hospitalization transitioned to oral levaquin.  - influenza negative  - recurrent aspiration  - awaiting SNF placement.  Chest pain  - troponin negative x 3 Cervical myelopathy sp fall in 2012 - continue home medications, PEG tube feeds.  PEG tube cellulitis - Abx for #1 covering.   Diet: NPO, tube feeds Fluids: none DVT Prophylaxis: Lovenox  Code Status: Full Family Communication: discussed in detail witht hepatient's mom, .  Disposition Plan: pending PT eval.  Consultants:  none  Procedures:  none   Antibiotics  Anti-infectives   Start     Dose/Rate Route Frequency Ordered Stop   07/11/13 1330  levofloxacin (LEVAQUIN) tablet 500 mg     500 mg Oral Daily 07/11/13 1242     07/06/13 1500  piperacillin-tazobactam (ZOSYN) IVPB 3.375 g  Status:  Discontinued     3.375 g 12.5 mL/hr over 240 Minutes Intravenous Every 8 hours 07/06/13 0854 07/11/13 1242   07/06/13 1400  vancomycin (VANCOCIN) IVPB 1000 mg/200 mL premix  Status:  Discontinued     1,000 mg 200 mL/hr over 60 Minutes Intravenous Every 8 hours 07/06/13 0854 07/11/13 1242   07/06/13 1100  piperacillin-tazobactam (ZOSYN) IVPB 3.375 g  Status:  Discontinued     3.375 g 12.5 mL/hr over 240 Minutes Intravenous Every 8 hours 07/06/13 0853 07/06/13 0854   07/06/13 1000  vancomycin (VANCOCIN) IVPB 1000 mg/200 mL premix  Status:  Discontinued     1,000 mg 200 mL/hr over 60 Minutes Intravenous Every 8 hours 07/06/13 0853 07/06/13 0854   07/06/13 0530  piperacillin-tazobactam (ZOSYN) IVPB 3.375 g     3.375 g 100 mL/hr over 30 Minutes Intravenous  Once 07/06/13 0526 07/06/13 0653   07/06/13 0530  vancomycin (VANCOCIN) IVPB 1000 mg/200 mL premix     1,000 mg 200 mL/hr over 60 Minutes Intravenous  Once  07/06/13 0526 07/06/13 0653     Antibiotics Given (last 72 hours)   Date/Time Action Medication Dose Rate   07/08/13 2245 Given   vancomycin (VANCOCIN) IVPB 1000 mg/200 mL premix 1,000 mg 200 mL/hr   07/08/13 2354 Given   piperacillin-tazobactam (ZOSYN) IVPB 3.375 g 3.375 g 12.5 mL/hr   07/09/13 0541 Given   vancomycin (VANCOCIN) IVPB 1000 mg/200 mL premix 1,000 mg 200 mL/hr   07/09/13 0700 Given   piperacillin-tazobactam (ZOSYN) IVPB 3.375 g 3.375 g 12.5 mL/hr   07/09/13 1441 Given   vancomycin (VANCOCIN) IVPB 1000 mg/200 mL premix 1,000 mg 200 mL/hr   07/09/13 1600 Given   piperacillin-tazobactam (ZOSYN) IVPB 3.375 g 3.375 g 12.5 mL/hr   07/09/13 2114 Given   vancomycin (VANCOCIN) IVPB 1000 mg/200 mL premix 1,000 mg 200 mL/hr   07/09/13 2244 Given   piperacillin-tazobactam (ZOSYN) IVPB 3.375 g 3.375 g 12.5 mL/hr   07/10/13 0532 Given   vancomycin (VANCOCIN) IVPB 1000 mg/200 mL premix 1,000 mg 200 mL/hr   07/10/13 6440 Given   piperacillin-tazobactam (ZOSYN) IVPB 3.375 g 3.375 g 12.5 mL/hr   07/10/13 1342 Given   vancomycin (VANCOCIN) IVPB 1000 mg/200 mL premix 1,000 mg 200 mL/hr   07/10/13 1533 Given   piperacillin-tazobactam (ZOSYN) IVPB 3.375 g 3.375 g 12.5 mL/hr   07/10/13 2342 Given   vancomycin (VANCOCIN) IVPB 1000 mg/200 mL premix 1,000 mg 200 mL/hr   07/10/13 2346 Given  piperacillin-tazobactam (ZOSYN) IVPB 3.375 g 3.375 g 12.5 mL/hr   07/11/13 0600 Given   vancomycin (VANCOCIN) IVPB 1000 mg/200 mL premix 1,000 mg 200 mL/hr   07/11/13 0700 Given   piperacillin-tazobactam (ZOSYN) IVPB 3.375 g 3.375 g 12.5 mL/hr   07/11/13 1347 Given   levofloxacin (LEVAQUIN) tablet 500 mg 500 mg       HPI/Subjective: Wants to go home.   Objective: Filed Vitals:   07/10/13 2150 07/11/13 0445 07/11/13 0556 07/11/13 1359  BP: 104/72  125/75 109/70  Pulse: 80  78 75  Temp: 98.2 F (36.8 C)  98.4 F (36.9 C) 97.5 F (36.4 C)  TempSrc: Oral  Oral   Resp: 18  18 18   Weight:   86.9 kg (191 lb 9.3 oz)    SpO2: 98%  97% 98%    Intake/Output Summary (Last 24 hours) at 07/11/13 1839 Last data filed at 07/11/13 1358  Gross per 24 hour  Intake   1500 ml  Output   1050 ml  Net    450 ml   Filed Weights   07/09/13 0500 07/10/13 0542 07/11/13 0445  Weight: 83.689 kg (184 lb 8 oz) 83.2 kg (183 lb 6.8 oz) 86.9 kg (191 lb 9.3 oz)   Exam:  General: No apparent distress Eyes: no scleral icterus ENT: moist oropharynx Neck: supple, no JVD Cardiovascular: regular rate without MRG; 2+ peripheral pulses Respiratory: no wheezing, scattered rhonchi Abdomen: soft, non tender to palpation, positive bowel sounds, no guarding, no rebound; PEG tube in place, mild cellulitic changes around insertion site. Skin: no rashes Musculoskeletal: no peripheral edema Psychiatric: normal mood and affect Neurologic: alert, follows commands, answers yes/no. Moves all 4, weak 4/5   Data Reviewed: Basic Metabolic Panel:  Recent Labs Lab 07/06/13 0325 07/07/13 0435 07/08/13 0530 07/09/13 0310  NA 131* 135* 137 138  K 4.6 3.5* 3.1* 3.7  CL 91* 96 97 102  CO2 32 27 27 25   GLUCOSE 101* 110* 128* 121*  BUN 13 11 10 12   CREATININE 0.58 0.51 0.55 0.55  CALCIUM 8.4 8.6 8.4 8.7   Liver Function Tests:  Recent Labs Lab 07/06/13 0325  AST 24  ALT 16  ALKPHOS 81  BILITOT <0.2*  PROT 6.8  ALBUMIN 3.4*   CBC:  Recent Labs Lab 07/06/13 0325 07/07/13 0435  WBC 8.6 7.3  NEUTROABS 6.8  --   HGB 15.6 15.1  HCT 47.3 45.8  MCV 95.9 94.0  PLT 132* 120*   Cardiac Enzymes:  Recent Labs Lab 07/06/13 1121 07/06/13 1616 07/06/13 2358  TROPONINI <0.30 <0.30 <0.30   CBG:  Recent Labs Lab 07/10/13 2356 07/11/13 0432 07/11/13 0855 07/11/13 1149 07/11/13 1625  GLUCAP 106* 114* 124* 108* 80    Recent Results (from the past 240 hour(s))  CULTURE, BLOOD (ROUTINE X 2)     Status: None   Collection Time    07/06/13  3:15 AM      Result Value Range Status   Specimen Description  BLOOD LEFT ARM   Final   Special Requests BOTTLES DRAWN AEROBIC AND ANAEROBIC 7CC EA   Final   Culture  Setup Time     Final   Value: 07/06/2013 13:49     Performed at [facility]   Culture     Final   Value:        BLOOD CULTURE RECEIVED NO GROWTH TO DATE CULTURE WILL BE HELD FOR 5 DAYS BEFORE ISSUING A FINAL NEGATIVE REPORT  Performed at [facility]   Report Status PENDING   Incomplete  CULTURE, BLOOD (ROUTINE X 2)     Status: None   Collection Time    07/06/13  3:25 AM      Result Value Range Status   Specimen Description BLOOD RIGHT ARM   Final   Special Requests BOTTLES DRAWN AEROBIC ONLY 3CC   Final   Culture  Setup Time     Final   Value: 07/06/2013 13:49     Performed at [facility]   Culture     Final   Value:        BLOOD CULTURE RECEIVED NO GROWTH TO DATE CULTURE WILL BE HELD FOR 5 DAYS BEFORE ISSUING A FINAL NEGATIVE REPORT     Performed at [facility]   Report Status PENDING   Incomplete     Studies: No results found.  Scheduled Meds: . allopurinol  300 mg Per Tube Daily  . citalopram  40 mg Per Tube Daily  . enoxaparin (LOVENOX) injection  40 mg Subcutaneous Q24H  . free water  250 mL Per Tube Q6H  . gabapentin  900 mg Oral Daily   And  . gabapentin  600 mg Oral QHS  . insulin aspart  0-9 Units Subcutaneous TID WC  . lacosamide  100 mg Oral BID  . levofloxacin  500 mg Oral Daily  . pantoprazole sodium  40 mg Per Tube BID  . polyethylene glycol  17 g Oral Daily  . topiramate  150 mg Oral Daily   And  . topiramate  100 mg Oral QHS  . traZODone  50 mg Per Tube QHS  . venlafaxine  37.5 mg Per NG tube Daily   Continuous Infusions: . feeding supplement (JEVITY 1.5 CAL)     Principal Problem:   HCAP (healthcare-associated pneumonia) Active Problems:   Seizure   Hypercholesterolemia   Cervical myelopathy   Depression   Unspecified constipation  Time spent: 25  Kathlen Mody MD [facility] Pager [phone].  If 7 PM - 7 AM, please contact night-coverage at [email or web address], password [facility] 07/11/2013, 6:39 PM  LOS: 5 days

## 2013-07-11 NOTE — Progress Notes (Signed)
Clinical Social Work Department BRIEF PSYCHOSOCIAL ASSESSMENT 07/11/2013  Patient:  Alexander Alexander [name],Alexander Alexander [name]     Account Number:  [id number]     Admit date:  07/06/2013  Clinical Social Worker:  Alexander Alexander [name],Alexander Alexander [name], LCSWA  Date/Time:  07/11/2013 11:30 AM  Referred by:  Physician  Date Referred:  07/11/2013 Referred for  SNF Placement   Other Referral:   Interview type:  Family Other interview type:   Spoke with pt mother at bedside    PSYCHOSOCIAL DATA Living Status:  ALONE Admitted from facility:   Level of care:   Primary support name:  Alexander Alexander [name] [phone] Primary support relationship to patient:  PARENT Degree of support available:   Pt has supportive family    CURRENT CONCERNS Current Concerns  Post-Acute Placement   Other Concerns:    SOCIAL WORK ASSESSMENT / PLAN CSW informed PT recommending SNF. CSW spoke with pt mother at bedside. Pt mother was aware of recommendation is says she feels this would be best for the pt and it would put her mind at ease. Pt mother reports pt did have some previous falls at home and rehab may be able to assit. Pt mother is unsure if this will be Alexander [name] or long term arrangement. Pt mother is hoping for short term however. Currently, pt lives alone in [facility] [name]. However, pt does have Alexander [name] during the day and Alexander [name] that comes and stays the night with the pt so pt is never alone. CSW explained SNF referral process. Pt mother is agreeable to being faxed out to [facility] Alexander [name] SNFs but has Alexander [name] for Alexander [name].   Assessment/plan status:  Psychosocial Support/Ongoing Assessment of Needs Other assessment/ plan:   Information/referral to community resources:   SNF list to be provided with bed offers if Alexander [name]    PATIENT'S/FAMILY'S RESPONSE TO PLAN OF CARE: [facility]       [facility] Alexander [name], LCSWA [phone]

## 2013-07-12 LAB — CULTURE, BLOOD (ROUTINE X 2)
Culture: NO GROWTH
Culture: NO GROWTH

## 2013-07-12 LAB — GLUCOSE, CAPILLARY
GLUCOSE-CAPILLARY: 122 mg/dL — AB (ref 70–99)
GLUCOSE-CAPILLARY: 138 mg/dL — AB (ref 70–99)
GLUCOSE-CAPILLARY: 86 mg/dL (ref 70–99)
GLUCOSE-CAPILLARY: 90 mg/dL (ref 70–99)
GLUCOSE-CAPILLARY: 90 mg/dL (ref 70–99)
Glucose-Capillary: 118 mg/dL — ABNORMAL HIGH (ref 70–99)
Glucose-Capillary: 119 mg/dL — ABNORMAL HIGH (ref 70–99)
Glucose-Capillary: 117 mg/dL — ABNORMAL HIGH (ref 70–99)

## 2013-07-12 LAB — EXPECTORATED SPUTUM ASSESSMENT W GRAM STAIN, RFLX TO RESP C

## 2013-07-12 LAB — EXPECTORATED SPUTUM ASSESSMENT W REFEX TO RESP CULTURE

## 2013-07-12 NOTE — Progress Notes (Signed)
PROGRESS NOTE  Alexander [name] ZOX:[id number] DOB: 20-Mar-1956 DOA: 07/06/2013 PCP: Dema Severin, NP  Assessment/Plan: HCAP - with fever, tachypnea, tachycardia and CXR with possible PNA.  - Vanc/Zosyn given recent hospitalization transitioned to oral levaquin.  - influenza negative  - recurrent aspiration  - awaiting SNF placement.  Chest pain  - troponin negative x 3 Cervical myelopathy sp fall in 2012 - continue home medications, PEG tube feeds.  PEG tube cellulitis - Abx for #1 covering.   Diet: NPO, tube feeds Fluids: none DVT Prophylaxis: Lovenox  Code Status: Full Family Communication: discussed in detail witht hepatient's mom, .  Disposition Plan: pending PT eval.  Consultants:  none  Procedures:  none   Antibiotics  Anti-infectives   Start     Dose/Rate Route Frequency Ordered Stop   07/11/13 1330  levofloxacin (LEVAQUIN) tablet 500 mg     500 mg Oral Daily 07/11/13 1242     07/06/13 1500  piperacillin-tazobactam (ZOSYN) IVPB 3.375 g  Status:  Discontinued     3.375 g 12.5 mL/hr over 240 Minutes Intravenous Every 8 hours 07/06/13 0854 07/11/13 1242   07/06/13 1400  vancomycin (VANCOCIN) IVPB 1000 mg/200 mL premix  Status:  Discontinued     1,000 mg 200 mL/hr over 60 Minutes Intravenous Every 8 hours 07/06/13 0854 07/11/13 1242   07/06/13 1100  piperacillin-tazobactam (ZOSYN) IVPB 3.375 g  Status:  Discontinued     3.375 g 12.5 mL/hr over 240 Minutes Intravenous Every 8 hours 07/06/13 0853 07/06/13 0854   07/06/13 1000  vancomycin (VANCOCIN) IVPB 1000 mg/200 mL premix  Status:  Discontinued     1,000 mg 200 mL/hr over 60 Minutes Intravenous Every 8 hours 07/06/13 0853 07/06/13 0854   07/06/13 0530  piperacillin-tazobactam (ZOSYN) IVPB 3.375 g     3.375 g 100 mL/hr over 30 Minutes Intravenous  Once 07/06/13 0526 07/06/13 0653   07/06/13 0530  vancomycin (VANCOCIN) IVPB 1000 mg/200 mL premix     1,000 mg 200 mL/hr over 60 Minutes Intravenous  Once  07/06/13 0526 07/06/13 0653     Antibiotics Given (last 72 hours)   Date/Time Action Medication Dose Rate   07/09/13 2114 Given   vancomycin (VANCOCIN) IVPB 1000 mg/200 mL premix 1,000 mg 200 mL/hr   07/09/13 2244 Given   piperacillin-tazobactam (ZOSYN) IVPB 3.375 g 3.375 g 12.5 mL/hr   07/10/13 0532 Given   vancomycin (VANCOCIN) IVPB 1000 mg/200 mL premix 1,000 mg 200 mL/hr   07/10/13 8119 Given   piperacillin-tazobactam (ZOSYN) IVPB 3.375 g 3.375 g 12.5 mL/hr   07/10/13 1342 Given   vancomycin (VANCOCIN) IVPB 1000 mg/200 mL premix 1,000 mg 200 mL/hr   07/10/13 1533 Given   piperacillin-tazobactam (ZOSYN) IVPB 3.375 g 3.375 g 12.5 mL/hr   07/10/13 2342 Given   vancomycin (VANCOCIN) IVPB 1000 mg/200 mL premix 1,000 mg 200 mL/hr   07/10/13 2346 Given   piperacillin-tazobactam (ZOSYN) IVPB 3.375 g 3.375 g 12.5 mL/hr   07/11/13 0600 Given   vancomycin (VANCOCIN) IVPB 1000 mg/200 mL premix 1,000 mg 200 mL/hr   07/11/13 0700 Given   piperacillin-tazobactam (ZOSYN) IVPB 3.375 g 3.375 g 12.5 mL/hr   07/11/13 1347 Given   levofloxacin (LEVAQUIN) tablet 500 mg 500 mg    07/12/13 1048 Given   levofloxacin (LEVAQUIN) tablet 500 mg 500 mg       HPI/Subjective: Wants to go home.   Objective: Filed Vitals:   07/11/13 1359 07/11/13 2009 07/12/13 0413 07/12/13 0540  BP: 109/70 118/73  121/75  Pulse: 75 81  82  Temp: 97.5 F (36.4 C) 98.5 F (36.9 C)  98.5 F (36.9 C)  TempSrc:  Oral  Oral  Resp: 18 18  19   Weight:   87.7 kg (193 lb 5.5 oz)   SpO2: 98% 99%  99%    Intake/Output Summary (Last 24 hours) at 07/12/13 1606 Last data filed at 07/12/13 1259  Gross per 24 hour  Intake 1911.67 ml  Output    800 ml  Net 1111.67 ml   Filed Weights   07/10/13 0542 07/11/13 0445 07/12/13 0413  Weight: 83.2 kg (183 lb 6.8 oz) 86.9 kg (191 lb 9.3 oz) 87.7 kg (193 lb 5.5 oz)   Exam:  General: No apparent distress Eyes: no scleral icterus ENT: moist oropharynx Neck: supple, no JVD  Cardiovascular: regular rate without MRG; 2+ peripheral pulses Respiratory: no wheezing, scattered rhonchi Abdomen: soft, non tender to palpation, positive bowel sounds, no guarding, no rebound; PEG tube in place, mild cellulitic changes around insertion site. Skin: no rashes Musculoskeletal: no peripheral edema Psychiatric: normal mood and affect Neurologic: alert, follows commands, answers yes/no. Moves all 4, weak 4/5   Data Reviewed: Basic Metabolic Panel:  Recent Labs Lab 07/06/13 0325 07/07/13 0435 07/08/13 0530 07/09/13 0310  NA 131* 135* 137 138  K 4.6 3.5* 3.1* 3.7  CL 91* 96 97 102  CO2 32 27 27 25   GLUCOSE 101* 110* 128* 121*  BUN 13 11 10 12   CREATININE 0.58 0.51 0.55 0.55  CALCIUM 8.4 8.6 8.4 8.7   Liver Function Tests:  Recent Labs Lab 07/06/13 0325  AST 24  ALT 16  ALKPHOS 81  BILITOT <0.2*  PROT 6.8  ALBUMIN 3.4*   CBC:  Recent Labs Lab 07/06/13 0325 07/07/13 0435  WBC 8.6 7.3  NEUTROABS 6.8  --   HGB 15.6 15.1  HCT 47.3 45.8  MCV 95.9 94.0  PLT 132* 120*   Cardiac Enzymes:  Recent Labs Lab 07/06/13 1121 07/06/13 1616 07/06/13 2358  TROPONINI <0.30 <0.30 <0.30   CBG:  Recent Labs Lab 07/12/13 0028 07/12/13 0112 07/12/13 0357 07/12/13 0756 07/12/13 1248  GLUCAP 138* 118* 122* 119* 117*    Recent Results (from the past 240 hour(s))  CULTURE, BLOOD (ROUTINE X 2)     Status: None   Collection Time    07/06/13  3:15 AM      Result Value Range Status   Specimen Description BLOOD LEFT ARM   Final   Special Requests BOTTLES DRAWN AEROBIC AND ANAEROBIC 7CC EA   Final   Culture  Setup Time     Final   Value: 07/06/2013 13:49     Performed at [facility]   Culture     Final   Value: NO GROWTH 5 DAYS     [facility]formed at [facility]   Report Status 07/12/2013 FINAL   Final  CULTURE, BLOOD (ROUTINE X 2)     Status: None   Collection Time    07/06/13  3:25 AM      Result Value Range Status   Specimen Description  BLOOD RIGHT ARM   Final   Special Requests BOTTLES DRAWN AEROBIC ONLY 3CC   Final   Culture  Setup Time     Final   Value: 07/06/2013 13:49     Performed at [facility]   Culture     Final   Value: NO GROWTH 5 DAYS     Performed at  [facility]   Report Status 07/12/2013 FINAL   Final     Studies: No results found.  Scheduled Meds: . allopurinol  300 mg Per Tube Daily  . citalopram  40 mg Per Tube Daily  . enoxaparin (LOVENOX) injection  40 mg Subcutaneous Q24H  . free water  250 mL Per Tube Q6H  . gabapentin  900 mg Oral Daily   And  . gabapentin  600 mg Oral QHS  . insulin aspart  0-9 Units Subcutaneous TID WC  . lacosamide  100 mg Oral BID  . levofloxacin  500 mg Oral Daily  . pantoprazole sodium  40 mg Per Tube BID  . polyethylene glycol  17 g Oral Daily  . topiramate  150 mg Oral Daily   And  . topiramate  100 mg Oral QHS  . traZODone  50 mg Per Tube QHS  . venlafaxine  37.5 mg Per NG tube Daily   Continuous Infusions: . feeding supplement (JEVITY 1.5 CAL) Stopped (07/12/13 0740)   Principal Problem:   HCAP (healthcare-associated pneumonia) Active Problems:   Seizure   Hypercholesterolemia   Cervical myelopathy   Depression   Unspecified constipation  Time spent: 25  Kathlen Mody MD [facility] Pager [phone]. If 7 PM - 7 AM, please contact night-coverage at [email or web address], password [email or web address] 07/12/2013, 4:06 PM  LOS: 6 days

## 2013-07-13 LAB — EXPECTORATED SPUTUM ASSESSMENT W GRAM STAIN, RFLX TO RESP C

## 2013-07-13 LAB — GLUCOSE, CAPILLARY
GLUCOSE-CAPILLARY: 106 mg/dL — AB (ref 70–99)
GLUCOSE-CAPILLARY: 84 mg/dL (ref 70–99)
Glucose-Capillary: 120 mg/dL — ABNORMAL HIGH (ref 70–99)
Glucose-Capillary: 121 mg/dL — ABNORMAL HIGH (ref 70–99)
Glucose-Capillary: 102 mg/dL — ABNORMAL HIGH (ref 70–99)

## 2013-07-13 LAB — EXPECTORATED SPUTUM ASSESSMENT W REFEX TO RESP CULTURE: SPECIAL REQUESTS: NORMAL

## 2013-07-13 NOTE — Discharge Summary (Signed)
Physician Discharge Summary  Urban Naval Macioce ZOX:[id number] DOB: 1955/07/06 DOA: 07/06/2013  PCP: Dema Severin, NP  Admit date: 07/06/2013 Discharge date: 07/13/2013  Time spent: 30 minutes  Recommendations for Outpatient Follow-up:  1. Follow up with PCP in one week  Discharge Diagnoses:  Principal Problem:   HCAP (healthcare-associated pneumonia) Active Problems:   Seizure   Hypercholesterolemia   Cervical myelopathy   Depression   Unspecified constipation   Discharge Condition: improved.   Diet recommendation: tube feeds  Filed Weights   07/11/13 0445 07/12/13 0413 07/13/13 0500  Weight: 86.9 kg (191 lb 9.3 oz) 87.7 kg (193 lb 5.5 oz) 86 kg (189 lb 9.5 oz)    History of present illness:  Alexander [name] is a 58 y.o. male has a past medical history significant for CNS degenerative disease, prior CVAs, DM, HTN, recurrent aspiration pneumonias, prior C1 fracture, presents to the ED with shortness of breath and left sided chest pain and fevers. History obtained per chart review as patient is minimally verbal. He is alert and can nod yes/no to some questions.    Hospital Course:  HCAP - with fever, tachypnea, tachycardia and CXR with possible PNA.  - Vanc/Zosyn given recent hospitalization transitioned to oral levaquin.  - influenza negative  - recurrent aspiration  - awaiting SNF placement.  Chest pain  - troponin negative x 3  Cervical myelopathy sp fall in 2012 - continue home medications, PEG tube feeds.  PEG tube cellulitis - Abx for #1 covering.  Diet: NPO, tube feeds  Fluids: none  DVT Prophylaxis: Lovenox  Procedures:  none  Consultations:  none  Discharge Exam: Filed Vitals:   07/13/13 1342  BP: 114/72  Pulse: 79  Temp: 98.4 F (36.9 C)  Resp: 18    General: No apparent distress  Cardiovascular: regular rate without MRG; 2+ peripheral pulses  Respiratory: no wheezing, scattered rhonchi  Abdomen: soft, non tender to palpation, positive bowel  sounds, no guarding, no rebound; PEG tube in place,   Musculoskeletal: no peripheral edema     Discharge Instructions   Future Appointments Provider Department Dept Phone   10/09/2013 2:30 PM Huston Foley, MD [facility] [phone]       Medication List    ASK your doctor about these medications       allopurinol 300 MG tablet  Commonly known as:  ZYLOPRIM  Give 300 mg by tube daily.     cholecalciferol 1000 UNITS tablet  Commonly known as:  VITAMIN D  Give 1,000 Units by tube 2 (two) times daily.     citalopram 40 MG tablet  Commonly known as:  CELEXA  Give 40 mg by tube daily.     cyclobenzaprine 10 MG tablet  Commonly known as:  FLEXERIL  Give 1 tablet by tube 3 (three) times daily as needed for muscle spasms.     esomeprazole 40 MG capsule  Commonly known as:  NEXIUM  Give 40 mg by tube daily before breakfast.     gabapentin 300 MG capsule  Commonly known as:  NEURONTIN  Give 600-900 mg by tube 2 (two) times daily. Take 3 capsules in the morning and 2 capsules in the evening.     HYDROcodone-acetaminophen 10-325 MG per tablet  Commonly known as:  NORCO  Give 1 tablet by tube every 6 (six) hours as needed (pain).     meloxicam 15 MG tablet  Commonly known as:  MOBIC  Give 1 tablet by tube every morning.  nystatin cream  Commonly known as:  MYCOSTATIN  Apply 1 application topically 2 (two) times daily as needed (irritation of incision).     polyethylene glycol packet  Commonly known as:  MIRALAX / GLYCOLAX  Give 17 g by tube daily.     silver sulfADIAZINE 1 % cream  Commonly known as:  SILVADENE  Apply 1 application topically 2 (two) times daily as needed (irritation of incision).     topiramate 100 MG tablet  Commonly known as:  TOPAMAX  Give 100-150 mg by tube 2 (two) times daily. Take 1.5 tablets in the morning and 1 tablet in the evening     traZODone 50 MG tablet  Commonly known as:  DESYREL  Give 50 mg by tube at bedtime.      venlafaxine XR 37.5 MG 24 hr capsule  Commonly known as:  EFFEXOR-XR  Give 37.5 mg by tube daily with breakfast.     VIMPAT 100 MG Tabs  Generic drug:  Lacosamide  Give 100 mg by tube 2 (two) times daily.     VISINE 0.05 % ophthalmic solution  Generic drug:  tetrahydrozoline  Place 2 drops into both eyes daily as needed (for dry eyes).       No Known Allergies    The results of significant diagnostics from this hospitalization (including imaging, microbiology, ancillary and laboratory) are listed below for reference.    Significant Diagnostic Studies: Dg Chest Port 1 View  07/09/2013   CLINICAL DATA:  Follow-up pneumonia  EXAM: PORTABLE CHEST - 1 VIEW  COMPARISON:  DG CHEST 1V PORT dated 07/06/2013; DG CHEST 2 VIEW dated 06/11/2013; DG CHEST 2 VIEW dated 03/30/2013; DG CHEST 2 VIEW dated 10/01/2012; DG CHEST PORTABLE dated 08/26/2012  FINDINGS: There is bibasilar airspace disease likely representing atelectasis. There is bilateral mild interstitial prominence likely related to low lung volumes. There is no pleural effusion or pneumothorax. Stable cardiomediastinal silhouette. There is evidence of posterior lower cervical spinal fusion.  IMPRESSION: Bibasilar airspace disease likely reflecting atelectasis.   Electronically Signed   By: Elige Ko   On: 07/09/2013 13:45   Dg Chest Portable 1 View  07/06/2013   CLINICAL DATA:  Chest pain  EXAM: PORTABLE CHEST - 1 VIEW  COMPARISON:  Prior radiograph from 06/11/2013  FINDINGS: The cardiac and mediastinal silhouettes are stable in size and contour, and remain within normal limits.  The lungs are hypoinflated. There are patchy left basilar opacities, which may reflect atelectasis or possibly infiltrate. Mild right basilar atelectasis is also present. Diffuse pulmonary vascular congestion is present without frank pulmonary edema. No pneumothorax.  Remote fracture/malunion and/or AC joint separation of the distal left clavicle is stable as compared  to prior exam. Remote right-sided rib fractures are stable. No acute osseous abnormality. Spinal fixation hardware overlies the cervical spine.  IMPRESSION: 1. Patchy left basilar opacity, which may reflect atelectatic changes, edema, or possibly infiltrate. 2. Diffuse pulmonary vascular congestion with interstitial thickening, suggestive of pulmonary interstitial edema.   Electronically Signed   By: Rise Mu M.D.   On: 07/06/2013 05:17   Dg Shoulder Left  07/07/2013   CLINICAL DATA:  Pain, fall  EXAM: LEFT SHOULDER - 2+ VIEW  COMPARISON:  None.  FINDINGS: Limited exam because of positioning. Chronic changes of the distal clavicle. No definite acute fracture. Fixation screw noted in the coracoid process.  IMPRESSION: Stable chronic and postoperative findings. No acute finding by plain radiography.   Electronically Signed   By: Beryle Beams  Shick M.D.   On: 07/07/2013 09:53    Microbiology: Recent Results (from the past 240 hour(s))  CULTURE, BLOOD (ROUTINE X 2)     Status: None   Collection Time    07/06/13  3:15 AM      Result Value Range Status   Specimen Description BLOOD LEFT ARM   Final   Special Requests BOTTLES DRAWN AEROBIC AND ANAEROBIC 7CC EA   Final   Culture  Setup Time     Final   Value: 07/06/2013 13:49     Performed at [facility]   Culture     Final   Value: NO GROWTH 5 DAYS     Performed at [facility]   Report Status 07/12/2013 FINAL   Final  CULTURE, BLOOD (ROUTINE X 2)     Status: None   Collection Time    07/06/13  3:25 AM      Result Value Range Status   Specimen Description BLOOD RIGHT ARM   Final   Special Requests BOTTLES DRAWN AEROBIC ONLY 3CC   Final   Culture  Setup Time     Final   Value: 07/06/2013 13:49     Performed at [facility]   Culture     Final   Value: NO GROWTH 5 DAYS     Performed at [facility]   Report Status 07/12/2013 FINAL   Final  CULTURE, EXPECTORATED SPUTUM-ASSESSMENT     Status: None    Collection Time    07/12/13  9:11 PM      Result Value Range Status   Specimen Description SPUTUM   Final   Special Requests NONE   Final   Sputum evaluation     Final   Value: MICROSCOPIC FINDINGS SUGGEST THAT THIS SPECIMEN IS NOT REPRESENTATIVE OF LOWER RESPIRATORY SECRETIONS. PLEASE RECOLLECT.     RESULT CALLED TO, READ BACK BY AND VERIFIED WITH: T. LIGHT RN [phone] [id number] GREEN R   Report Status 07/12/2013 FINAL   Final  CULTURE, EXPECTORATED SPUTUM-ASSESSMENT     Status: None   Collection Time    07/13/13  7:50 AM      Result Value Range Status   Specimen Description SPUTUM   Final   Special Requests Normal   Final   Sputum evaluation     Final   Value: THIS SPECIMEN IS ACCEPTABLE. RESPIRATORY CULTURE REPORT TO FOLLOW.   Report Status 07/13/2013 FINAL   Final     Labs: Basic Metabolic Panel:  Recent Labs Lab 07/07/13 0435 07/08/13 0530 07/09/13 0310  NA 135* 137 138  K 3.5* 3.1* 3.7  CL 96 97 102  CO2 27 27 25   GLUCOSE 110* 128* 121*  BUN 11 10 12   CREATININE 0.51 0.55 0.55  CALCIUM 8.6 8.4 8.7   Liver Function Tests: No results found for this basename: AST, ALT, ALKPHOS, BILITOT, PROT, ALBUMIN,  in the last 168 hours No results found for this basename: LIPASE, AMYLASE,  in the last 168 hours No results found for this basename: AMMONIA,  in the last 168 hours CBC:  Recent Labs Lab 07/07/13 0435  WBC 7.3  HGB 15.1  HCT 45.8  MCV 94.0  PLT 120*   Cardiac Enzymes:  Recent Labs Lab 07/06/13 2358  TROPONINI <0.30   BNP: BNP (last 3 results) No results found for this basename: PROBNP,  in the last 8760 hours CBG:  Recent Labs Lab 07/12/13 2347 07/13/13 0443 07/13/13 0810 07/13/13 1201 07/13/13  1631  GLUCAP 90 120* 121* 106* 84       Signed:  Virgal Warmuth  [facility] 07/13/2013, 5:48 PM

## 2013-07-14 ENCOUNTER — Inpatient Hospital Stay (HOSPITAL_COMMUNITY): Payer: Medicare Other

## 2013-07-14 LAB — GLUCOSE, CAPILLARY
GLUCOSE-CAPILLARY: 87 mg/dL (ref 70–99)
Glucose-Capillary: 109 mg/dL — ABNORMAL HIGH (ref 70–99)
Glucose-Capillary: 120 mg/dL — ABNORMAL HIGH (ref 70–99)
Glucose-Capillary: 91 mg/dL (ref 70–99)

## 2013-07-14 MED ORDER — INSULIN ASPART 100 UNIT/ML ~~LOC~~ SOLN: 0.0000 [IU] | Freq: Three times a day (TID) | SUBCUTANEOUS | Status: DC

## 2013-07-14 MED ORDER — HYDROCODONE-ACETAMINOPHEN 10-325 MG PO TABS: 1.0000 | ORAL_TABLET | Freq: Four times a day (QID) | ORAL | Status: DC | PRN

## 2013-07-14 MED ORDER — FREE WATER: 250.0000 mL | Freq: Four times a day (QID) | Status: DC

## 2013-07-14 MED ORDER — PANTOPRAZOLE SODIUM 40 MG PO PACK: 40.0000 mg | PACK | Freq: Two times a day (BID) | ORAL | Status: DC

## 2013-07-14 MED ORDER — JEVITY 1.5 CAL PO LIQD: 1000.0000 mL | ORAL | Status: DC

## 2013-07-14 MED ORDER — NAPHAZOLINE-GLYCERIN 0.012-0.2 % OP SOLN: 1.0000 [drp] | OPHTHALMIC | Status: DC | PRN

## 2013-07-14 NOTE — Progress Notes (Signed)
Pt continues to cough up a thick yellow mucous. Once during the night he requested that his tube feedings be stopped because he thought he was going to vomit. He was able to cough up a thick mucous and his breathing improved. Sats improved from 92% to 97%, and tube feeding was restarted.

## 2013-07-14 NOTE — Progress Notes (Signed)
CSW (Clinical Social Worker) prepared pt dc packet and placed with shadow chart. CSW arranged non-emergent ambulance transport at 2:30pm. Pt, pt family, pt nurse, and facility informed. CSW signing off.  Ayla Dunigan, LCSWA [phone]  

## 2013-07-14 NOTE — Discharge Summary (Addendum)
Physician Discharge Summary  Alexander [name] EXB:[id number] DOB: 09-11-1955 DOA: 07/06/2013  PCP: Alexander SeverinYORK,REGINA F, NP  Admit date: 07/06/2013 Discharge date: 07/14/2013  Time spent: 30 minutes  Recommendations for Outpatient Follow-up:  1. Follow up with PCP in one week 2. Continue with incentive spirometry.  Discharge Diagnoses:  Principal Problem:   HCAP (healthcare-associated pneumonia) Active Problems:   Seizure   Hypercholesterolemia   Cervical myelopathy   Depression   Unspecified constipation   Discharge Condition: improved.   Diet recommendation: tube feeds  Filed Weights   07/12/13 0413 07/13/13 0500 07/14/13 0500  Weight: 87.7 kg (193 lb 5.5 oz) 86 kg (189 lb 9.5 oz) 85 kg (187 lb 6.3 oz)    History of present illness:  Alexander [name] is a 58 y.o. male has a past medical history significant for CNS degenerative disease, prior CVAs, DM, HTN, recurrent aspiration pneumonias, prior C1 fracture, presents to the ED with shortness of breath and left sided chest pain and fevers. History obtained per chart review as patient is minimally verbal. HE WAS treated for HCAP and he completed 9 days of antibiotics. Repeat CXR showed atelectasis. Recommended incentive spirometry.    Hospital Course:  HCAP - with fever, tachypnea, tachycardia and CXR with possible PNA.  - Vanc/Zosyn given recent hospitalization transitioned to oral levaquin.  - influenza negative  - recurrent aspiration from tube feeds. Discussed with the patient's mom.  - awaiting SNF placement.  Chest pain  - troponin negative x 3 . Resolved.  Cervical myelopathy sp fall in 2012 - continue home medications, PEG tube feeds.  PEG tube cellulitis - improved.  Diet: NPO, tube feeds   Seizures Resume vimpat and topamax.   Depression: Resume effexor.      Procedures:  CXR  Consultations:  none  Discharge Exam: Filed Vitals:   07/14/13 0614  BP: 121/85  Pulse: 81  Temp: 98.1 [name] (36.7 C)  Resp:  18    General: No apparent distress  Cardiovascular: regular rate without MRG; 2+ peripheral pulses  Respiratory: no wheezing, scattered rhonchi  Abdomen: soft, non tender to palpation, positive bowel sounds, no guarding, no rebound; PEG tube in place,   Musculoskeletal: no peripheral edema     Discharge Instructions       Future Appointments Provider Department Dept Phone   10/09/2013 2:30 PM Huston FoleySaima Athar, MD [facility] [phone]       Medication List    STOP taking these medications       esomeprazole 40 MG capsule  Commonly known as:  NEXIUM      TAKE these medications       allopurinol 300 MG tablet  Commonly known as:  ZYLOPRIM  Give 300 mg by tube daily.     cholecalciferol 1000 UNITS tablet  Commonly known as:  VITAMIN D  Give 1,000 Units by tube 2 (two) times daily.     citalopram 40 MG tablet  Commonly known as:  CELEXA  Give 40 mg by tube daily.     cyclobenzaprine 10 MG tablet  Commonly known as:  FLEXERIL  Give 1 tablet by tube 3 (three) times daily as needed for muscle spasms.     feeding supplement (JEVITY 1.5 CAL) Liqd  Place 1,000 mLs into feeding tube continuous.     free water Soln  Place 250 mLs into feeding tube every 6 (six) hours.     gabapentin 300 MG capsule  Commonly known as:  NEURONTIN  Give 600-900 mg  by tube 2 (two) times daily. Take 3 capsules in the morning and 2 capsules in the evening.     HYDROcodone-acetaminophen 10-325 MG per tablet  Commonly known as:  NORCO  Place 1 tablet into feeding tube every 6 (six) hours as needed (pain).     insulin aspart 100 UNIT/ML injection  Commonly known as:  novoLOG  Inject 0-9 Units into the skin 3 (three) times daily with meals.     meloxicam 15 MG tablet  Commonly known as:  MOBIC  Give 1 tablet by tube every morning.     naphazoline-glycerin 0.012-0.2 % Soln  Commonly known as:  CLEAR EYES  Place 1 drop into both eyes every 4 (four) hours as needed for  irritation.     nystatin cream  Commonly known as:  MYCOSTATIN  Apply 1 application topically 2 (two) times daily as needed (irritation of incision).     pantoprazole sodium 40 mg/20 mL Pack  Commonly known as:  PROTONIX  Place 20 mLs (40 mg total) into feeding tube 2 (two) times daily.     polyethylene glycol packet  Commonly known as:  MIRALAX / GLYCOLAX  Give 17 g by tube daily.     silver sulfADIAZINE 1 % cream  Commonly known as:  SILVADENE  Apply 1 application topically 2 (two) times daily as needed (irritation of incision).     topiramate 100 MG tablet  Commonly known as:  TOPAMAX  Give 100-150 mg by tube 2 (two) times daily. Take 1.5 tablets in the morning and 1 tablet in the evening     traZODone 50 MG tablet  Commonly known as:  DESYREL  Give 50 mg by tube at bedtime.     venlafaxine XR 37.5 MG 24 hr capsule  Commonly known as:  EFFEXOR-XR  Give 37.5 mg by tube daily with breakfast.     VIMPAT 100 MG Tabs  Generic drug:  Lacosamide  Give 100 mg by tube 2 (two) times daily.     VISINE 0.05 % ophthalmic solution  Generic drug:  tetrahydrozoline  Place 2 drops into both eyes daily as needed (for dry eyes).       No Known Allergies Follow-up Information   Follow up with Alexander Severin, NP. Schedule an appointment as soon as possible for a visit in 1 week.   Contact information:   [address] [place] [address] [phone]        The results of significant diagnostics from this hospitalization (including imaging, microbiology, ancillary and laboratory) are listed below for reference.    Significant Diagnostic Studies: Dg Chest Port 1 View  07/09/2013   CLINICAL DATA:  Follow-up pneumonia  EXAM: PORTABLE CHEST - 1 VIEW  COMPARISON:  DG CHEST 1V PORT dated 07/06/2013; DG CHEST 2 VIEW dated 06/11/2013; DG CHEST 2 VIEW dated 03/30/2013; DG CHEST 2 VIEW dated 10/01/2012; DG CHEST PORTABLE dated 08/26/2012  FINDINGS: There is bibasilar airspace disease likely  representing atelectasis. There is bilateral mild interstitial prominence likely related to low lung volumes. There is no pleural effusion or pneumothorax. Stable cardiomediastinal silhouette. There is evidence of posterior lower cervical spinal fusion.  IMPRESSION: Bibasilar airspace disease likely reflecting atelectasis.   Electronically Signed   By: Elige Ko   On: 07/09/2013 13:45   Dg Chest Portable 1 View  07/06/2013   CLINICAL DATA:  Chest pain  EXAM: PORTABLE CHEST - 1 VIEW  COMPARISON:  Prior radiograph from 06/11/2013  FINDINGS: The cardiac and  mediastinal silhouettes are stable in size and contour, and remain within normal limits.  The lungs are hypoinflated. There are patchy left basilar opacities, which may reflect atelectasis or possibly infiltrate. Mild right basilar atelectasis is also present. Diffuse pulmonary vascular congestion is present without frank pulmonary edema. No pneumothorax.  Remote fracture/malunion and/or AC joint separation of the distal left clavicle is stable as compared to prior exam. Remote right-sided rib fractures are stable. No acute osseous abnormality. Spinal fixation hardware overlies the cervical spine.  IMPRESSION: 1. Patchy left basilar opacity, which may reflect atelectatic changes, edema, or possibly infiltrate. 2. Diffuse pulmonary vascular congestion with interstitial thickening, suggestive of pulmonary interstitial edema.   Electronically Signed   By: Rise Mu M.D.   On: 07/06/2013 05:17   Dg Shoulder Left  07/07/2013   CLINICAL DATA:  Pain, fall  EXAM: LEFT SHOULDER - 2+ VIEW  COMPARISON:  None.  FINDINGS: Limited exam because of positioning. Chronic changes of the distal clavicle. No definite acute fracture. Fixation screw noted in the coracoid process.  IMPRESSION: Stable chronic and postoperative findings. No acute finding by plain radiography.   Electronically Signed   By: Ruel Favors M.D.   On: 07/07/2013 09:53    Microbiology: Recent  Results (from the past 240 hour(s))  CULTURE, BLOOD (ROUTINE X 2)     Status: None   Collection Time    07/06/13  3:15 AM      Result Value Range Status   Specimen Description BLOOD LEFT ARM   Final   Special Requests BOTTLES DRAWN AEROBIC AND ANAEROBIC 7CC EA   Final   Culture  Setup Time     Final   Value: 07/06/2013 13:49     Performed at [facility]   Culture     Final   Value: NO GROWTH 5 DAYS     Performed at [facility]   Report Status 07/12/2013 FINAL   Final  CULTURE, BLOOD (ROUTINE X 2)     Status: None   Collection Time    07/06/13  3:25 AM      Result Value Range Status   Specimen Description BLOOD RIGHT ARM   Final   Special Requests BOTTLES DRAWN AEROBIC ONLY 3CC   Final   Culture  Setup Time     Final   Value: 07/06/2013 13:49     Performed at [facility]   Culture     Final   Value: NO GROWTH 5 DAYS     Performed at [facility]   Report Status 07/12/2013 FINAL   Final  CULTURE, EXPECTORATED SPUTUM-ASSESSMENT     Status: None   Collection Time    07/12/13  9:11 PM      Result Value Range Status   Specimen Description SPUTUM   Final   Special Requests NONE   Final   Sputum evaluation     Final   Value: MICROSCOPIC FINDINGS SUGGEST THAT THIS SPECIMEN IS NOT REPRESENTATIVE OF LOWER RESPIRATORY SECRETIONS. PLEASE RECOLLECT.     RESULT CALLED TO, READ BACK BY AND VERIFIED WITH: T. LIGHT RN [phone] [id number] GREEN R   Report Status 07/12/2013 FINAL   Final  CULTURE, EXPECTORATED SPUTUM-ASSESSMENT     Status: None   Collection Time    07/13/13  7:50 AM      Result Value Range Status   Specimen Description SPUTUM   Final   Special Requests Normal   Final   Sputum evaluation  Final   Value: THIS SPECIMEN IS ACCEPTABLE. RESPIRATORY CULTURE REPORT TO FOLLOW.   Report Status 07/13/2013 FINAL   Final  CULTURE, RESPIRATORY (NON-EXPECTORATED)     Status: None   Collection Time    07/13/13  7:50 AM      Result Value Range Status    Specimen Description SPUTUM   Final   Special Requests NONE   Final   Gram Stain PENDING   Incomplete   Culture     Final   Value: ABUNDANT GRAM NEGATIVE RODS     Performed at [facility]   Report Status PENDING   Incomplete     Labs: Basic Metabolic Panel:  Recent Labs Lab 07/08/13 0530 07/09/13 0310  NA 137 138  K 3.1* 3.7  CL 97 102  CO2 27 25  GLUCOSE 128* 121*  BUN 10 12  CREATININE 0.55 0.55  CALCIUM 8.4 8.7   Liver Function Tests: No results found for this basename: AST, ALT, ALKPHOS, BILITOT, PROT, ALBUMIN,  in the last 168 hours No results found for this basename: LIPASE, AMYLASE,  in the last 168 hours No results found for this basename: AMMONIA,  in the last 168 hours CBC: No results found for this basename: WBC, NEUTROABS, HGB, HCT, MCV, PLT,  in the last 168 hours Cardiac Enzymes: No results found for this basename: CKTOTAL, CKMB, CKMBINDEX, TROPONINI,  in the last 168 hours BNP: BNP (last 3 results) No results found for this basename: PROBNP,  in the last 8760 hours CBG:  Recent Labs Lab 07/13/13 1631 07/13/13 2042 07/14/13 0047 07/14/13 0419 07/14/13 0851  GLUCAP 84 102* 120* 109* 87       Signed:  Shanequia Kendrick  [facility] 07/14/2013, 11:30 AM

## 2013-07-14 NOTE — Progress Notes (Signed)
At end of tube feeding, pt was turned from side to side for his bath. He started coughing and mucous smelling like his tube feeding was suctioned out of his mouth. Pt was able to state that he tasted the tube feeding when he was coughing. sats at 97%. Respirations normal after suctioning.

## 2013-07-15 LAB — CULTURE, RESPIRATORY W GRAM STAIN

## 2013-07-15 LAB — CULTURE, RESPIRATORY

## 2013-07-16 ENCOUNTER — Emergency Department (HOSPITAL_COMMUNITY)
Admission: EM | Admit: 2013-07-16 | Discharge: 2013-07-17 | Disposition: A | Discharge status: Skilled Nursing Facility | Payer: Medicare Other | Attending: Emergency Medicine | Admitting: Emergency Medicine

## 2013-07-16 ENCOUNTER — Encounter (HOSPITAL_COMMUNITY): Payer: Self-pay | Admitting: Emergency Medicine

## 2013-07-16 DIAGNOSIS — Z794 Long term (current) use of insulin: Secondary | ICD-10-CM | POA: Insufficient documentation

## 2013-07-16 DIAGNOSIS — R1032 Left lower quadrant pain: Secondary | ICD-10-CM | POA: Insufficient documentation

## 2013-07-16 DIAGNOSIS — Z79899 Other long term (current) drug therapy: Secondary | ICD-10-CM | POA: Insufficient documentation

## 2013-07-16 DIAGNOSIS — Z791 Long term (current) use of non-steroidal anti-inflammatories (NSAID): Secondary | ICD-10-CM | POA: Insufficient documentation

## 2013-07-16 DIAGNOSIS — G609 Hereditary and idiopathic neuropathy, unspecified: Secondary | ICD-10-CM | POA: Insufficient documentation

## 2013-07-16 DIAGNOSIS — K5900 Constipation, unspecified: Secondary | ICD-10-CM | POA: Insufficient documentation

## 2013-07-16 DIAGNOSIS — Z8781 Personal history of (healed) traumatic fracture: Secondary | ICD-10-CM | POA: Insufficient documentation

## 2013-07-16 DIAGNOSIS — M109 Gout, unspecified: Secondary | ICD-10-CM | POA: Insufficient documentation

## 2013-07-16 DIAGNOSIS — F3289 Other specified depressive episodes: Secondary | ICD-10-CM | POA: Insufficient documentation

## 2013-07-16 DIAGNOSIS — Z87891 Personal history of nicotine dependence: Secondary | ICD-10-CM | POA: Insufficient documentation

## 2013-07-16 DIAGNOSIS — E119 Type 2 diabetes mellitus without complications: Secondary | ICD-10-CM | POA: Insufficient documentation

## 2013-07-16 DIAGNOSIS — R109 Unspecified abdominal pain: Secondary | ICD-10-CM

## 2013-07-16 DIAGNOSIS — Z8673 Personal history of transient ischemic attack (TIA), and cerebral infarction without residual deficits: Secondary | ICD-10-CM | POA: Insufficient documentation

## 2013-07-16 DIAGNOSIS — I10 Essential (primary) hypertension: Secondary | ICD-10-CM | POA: Insufficient documentation

## 2013-07-16 DIAGNOSIS — G40909 Epilepsy, unspecified, not intractable, without status epilepticus: Secondary | ICD-10-CM | POA: Insufficient documentation

## 2013-07-16 DIAGNOSIS — Z8701 Personal history of pneumonia (recurrent): Secondary | ICD-10-CM | POA: Insufficient documentation

## 2013-07-16 DIAGNOSIS — F329 Major depressive disorder, single episode, unspecified: Secondary | ICD-10-CM | POA: Insufficient documentation

## 2013-07-16 DIAGNOSIS — Z87820 Personal history of traumatic brain injury: Secondary | ICD-10-CM | POA: Insufficient documentation

## 2013-07-16 MED ORDER — MORPHINE SULFATE 4 MG/ML IJ SOLN
4.0000 mg | Freq: Once | INTRAMUSCULAR | Status: AC
  Administered 2013-07-17: 4 mg via INTRAVENOUS
  Filled 2013-07-16: qty 1

## 2013-07-16 NOTE — ED Notes (Signed)
Per EMS, pt coming from [facility]. Pt c/o of left sided abd pain around G-tube. No nausea, vomiting, or diarrhea. Pt has cylsis access to right thigh. Pt was seen here on 07/06/13 for pneumonia.
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
## 2013-07-17 ENCOUNTER — Encounter (HOSPITAL_COMMUNITY): Payer: Self-pay | Admitting: Radiology

## 2013-07-17 ENCOUNTER — Emergency Department (HOSPITAL_COMMUNITY): Payer: Medicare Other

## 2013-07-17 LAB — COMPREHENSIVE METABOLIC PANEL
ALT: 25 U/L (ref 0–53)
AST: 26 U/L (ref 0–37)
Albumin: 3.5 g/dL (ref 3.5–5.2)
Alkaline Phosphatase: 83 U/L (ref 39–117)
BUN: 21 mg/dL (ref 6–23)
CO2: 29 meq/L (ref 19–32)
Calcium: 9 mg/dL (ref 8.4–10.5)
Chloride: 104 mEq/L (ref 96–112)
Creatinine, Ser: 0.66 mg/dL (ref 0.50–1.35)
GFR calc Af Amer: >90 mL/min (ref >90)
GLUCOSE: 77 mg/dL (ref 70–99)
POTASSIUM: 3.6 meq/L — AB (ref 3.7–5.3)
SODIUM: 142 meq/L (ref 137–147)
Total Bilirubin: 0.3 mg/dL (ref 0.3–1.2)
Total Protein: 7.4 g/dL (ref 6.0–8.3)

## 2013-07-17 LAB — CBC WITH DIFFERENTIAL/PLATELET
BASOS PCT: 1 % (ref 0–1)
Basophils Absolute: 0.1 10*3/uL (ref 0.0–0.1)
EOS ABS: 0.5 10*3/uL (ref 0.0–0.7)
Eosinophils Relative: 6 % — ABNORMAL HIGH (ref 0–5)
HEMATOCRIT: 44.5 % (ref 39.0–52.0)
HEMOGLOBIN: 14.4 g/dL (ref 13.0–17.0)
Lymphocytes Relative: 25 % (ref 12–46)
Lymphs Abs: 2 10*3/uL (ref 0.7–4.0)
MCH: 31.4 pg (ref 26.0–34.0)
MCHC: 32.4 g/dL (ref 30.0–36.0)
MCV: 96.9 fL (ref 78.0–100.0)
MONOS PCT: 9 % (ref 3–12)
Monocytes Absolute: 0.7 10*3/uL (ref 0.1–1.0)
NEUTROS ABS: 4.6 10*3/uL (ref 1.7–7.7)
Neutrophils Relative %: 59 % (ref 43–77)
Platelets: 290 10*3/uL (ref 150–400)
RBC: 4.59 MIL/uL (ref 4.22–5.81)
RDW: 13.2 % (ref 11.5–15.5)
WBC: 7.9 10*3/uL (ref 4.0–10.5)

## 2013-07-17 LAB — URINALYSIS, ROUTINE W REFLEX MICROSCOPIC
BILIRUBIN URINE: NEGATIVE
Glucose, UA: NEGATIVE mg/dL
Hgb urine dipstick: NEGATIVE
KETONES UR: NEGATIVE mg/dL
Nitrite: NEGATIVE
PROTEIN: NEGATIVE mg/dL
Specific Gravity, Urine: 1.018 (ref 1.005–1.030)
Urobilinogen, UA: 0.2 mg/dL (ref 0.0–1.0)
pH: 7 (ref 5.0–8.0)

## 2013-07-17 LAB — URINE MICROSCOPIC-ADD ON

## 2013-07-17 MED ORDER — IOHEXOL 300 MG/ML  SOLN
100.0000 mL | Freq: Once | INTRAMUSCULAR | Status: AC | PRN
  Administered 2013-07-17: 100 mL via INTRAVENOUS

## 2013-07-17 MED ORDER — OXYCODONE-ACETAMINOPHEN 5-325 MG PO TABS
1.0000 | ORAL_TABLET | Freq: Once | ORAL | Status: AC
  Administered 2013-07-17: 1 via ORAL
  Filled 2013-07-17: qty 1

## 2013-07-17 MED ORDER — HYDROCODONE-ACETAMINOPHEN 7.5-325 MG/15ML PO SOLN: 15.0000 mL | Freq: Three times a day (TID) | ORAL | Status: DC | PRN

## 2013-07-17 MED ORDER — LEVOFLOXACIN 25 MG/ML PO SOLN: 750.0000 mg | Freq: Every day | ORAL | Status: DC

## 2013-07-17 MED ORDER — POLYETHYLENE GLYCOL 3350 17 GM/SCOOP PO POWD: 17.0000 g | Freq: Two times a day (BID) | ORAL | Status: DC

## 2013-07-17 NOTE — ED Notes (Signed)
Ct notified pt has had contrast.

## 2013-07-17 NOTE — Discharge Instructions (Signed)
The CT scan shows that you are severely constipated, there is no signs of infection, no signs of surgical problems. It also shows that you have a possible pneumonia. As you are not coughing or having shortness of breath, you have no fevers and normal blood counts this is less likely. It appears that your last 2 x-rays have shown similar lung findings. Please have your doctor followup these results in the next 2 days, and return to the hospital for severe or worsening symptoms especially if you develop difficulty breathing.  Please call your doctor for a followup appointment within 24-48 hours. When you talk to your doctor please let them know that you were seen in the emergency department and have them acquire all of your records so that they can discuss the findings with you and formulate a treatment plan to fully care for your new and ongoing problems.

## 2013-07-17 NOTE — ED Notes (Signed)
  Pt transported to ct 

## 2013-07-17 NOTE — ED Notes (Addendum)
Report called to [facility] nursing facility. Reported to nurse that it was important to monitor pt for any signs of pneumonia due to ct imaging.  Lupita Leashonna RN

## 2013-09-18 ENCOUNTER — Emergency Department (HOSPITAL_COMMUNITY)
Admission: EM | Admit: 2013-09-18 | Discharge: 2013-09-18 | Disposition: A | Discharge status: Home / Self Care | Payer: Medicare Other | Attending: Emergency Medicine | Admitting: Emergency Medicine

## 2013-09-18 ENCOUNTER — Emergency Department (HOSPITAL_COMMUNITY): Payer: Medicare Other

## 2013-09-18 DIAGNOSIS — R3911 Hesitancy of micturition: Secondary | ICD-10-CM | POA: Insufficient documentation

## 2013-09-18 DIAGNOSIS — Z87828 Personal history of other (healed) physical injury and trauma: Secondary | ICD-10-CM | POA: Insufficient documentation

## 2013-09-18 DIAGNOSIS — Z8701 Personal history of pneumonia (recurrent): Secondary | ICD-10-CM | POA: Insufficient documentation

## 2013-09-18 DIAGNOSIS — Z931 Gastrostomy status: Secondary | ICD-10-CM | POA: Insufficient documentation

## 2013-09-18 DIAGNOSIS — Z8673 Personal history of transient ischemic attack (TIA), and cerebral infarction without residual deficits: Secondary | ICD-10-CM | POA: Insufficient documentation

## 2013-09-18 DIAGNOSIS — Z87891 Personal history of nicotine dependence: Secondary | ICD-10-CM | POA: Insufficient documentation

## 2013-09-18 DIAGNOSIS — Z87820 Personal history of traumatic brain injury: Secondary | ICD-10-CM | POA: Insufficient documentation

## 2013-09-18 DIAGNOSIS — M109 Gout, unspecified: Secondary | ICD-10-CM | POA: Insufficient documentation

## 2013-09-18 DIAGNOSIS — I10 Essential (primary) hypertension: Secondary | ICD-10-CM | POA: Insufficient documentation

## 2013-09-18 DIAGNOSIS — E7800 Pure hypercholesterolemia, unspecified: Secondary | ICD-10-CM | POA: Insufficient documentation

## 2013-09-18 DIAGNOSIS — E119 Type 2 diabetes mellitus without complications: Secondary | ICD-10-CM | POA: Insufficient documentation

## 2013-09-18 DIAGNOSIS — F329 Major depressive disorder, single episode, unspecified: Secondary | ICD-10-CM | POA: Insufficient documentation

## 2013-09-18 DIAGNOSIS — Z79899 Other long term (current) drug therapy: Secondary | ICD-10-CM | POA: Insufficient documentation

## 2013-09-18 DIAGNOSIS — Z9889 Other specified postprocedural states: Secondary | ICD-10-CM | POA: Insufficient documentation

## 2013-09-18 DIAGNOSIS — F3289 Other specified depressive episodes: Secondary | ICD-10-CM | POA: Insufficient documentation

## 2013-09-18 DIAGNOSIS — Z794 Long term (current) use of insulin: Secondary | ICD-10-CM | POA: Insufficient documentation

## 2013-09-18 DIAGNOSIS — G40909 Epilepsy, unspecified, not intractable, without status epilepticus: Secondary | ICD-10-CM | POA: Insufficient documentation

## 2013-09-18 DIAGNOSIS — G988 Other disorders of nervous system: Secondary | ICD-10-CM | POA: Insufficient documentation

## 2013-09-18 LAB — CBC WITH DIFFERENTIAL/PLATELET
Basophils Absolute: 0 10*3/uL (ref 0.0–0.1)
Basophils Relative: 0 % (ref 0–1)
Eosinophils Absolute: 0.5 10*3/uL (ref 0.0–0.7)
Eosinophils Relative: 7 % — ABNORMAL HIGH (ref 0–5)
HCT: 47 % (ref 39.0–52.0)
HEMOGLOBIN: 15.8 g/dL (ref 13.0–17.0)
LYMPHS ABS: 1.9 10*3/uL (ref 0.7–4.0)
Lymphocytes Relative: 28 % (ref 12–46)
MCH: 31.3 pg (ref 26.0–34.0)
MCHC: 33.6 g/dL (ref 30.0–36.0)
MCV: 93.3 fL (ref 78.0–100.0)
MONOS PCT: 6 % (ref 3–12)
Monocytes Absolute: 0.4 10*3/uL (ref 0.1–1.0)
NEUTROS PCT: 58 % (ref 43–77)
Neutro Abs: 3.8 10*3/uL (ref 1.7–7.7)
Platelets: 173 10*3/uL (ref 150–400)
RBC: 5.04 MIL/uL (ref 4.22–5.81)
RDW: 13.3 % (ref 11.5–15.5)
WBC: 6.6 10*3/uL (ref 4.0–10.5)

## 2013-09-18 LAB — URINALYSIS, ROUTINE W REFLEX MICROSCOPIC
Bilirubin Urine: NEGATIVE
Glucose, UA: NEGATIVE mg/dL
Hgb urine dipstick: NEGATIVE
Ketones, ur: NEGATIVE mg/dL
LEUKOCYTES UA: NEGATIVE
NITRITE: NEGATIVE
PH: 6.5 (ref 5.0–8.0)
Protein, ur: NEGATIVE mg/dL
Specific Gravity, Urine: 1.012 (ref 1.005–1.030)
UROBILINOGEN UA: 0.2 mg/dL (ref 0.0–1.0)

## 2013-09-18 LAB — COMPREHENSIVE METABOLIC PANEL
ALK PHOS: 94 U/L (ref 39–117)
ALT: 30 U/L (ref 0–53)
AST: 29 U/L (ref 0–37)
Albumin: 3.8 g/dL (ref 3.5–5.2)
BUN: 8 mg/dL (ref 6–23)
CO2: 30 mEq/L (ref 19–32)
Calcium: 9.4 mg/dL (ref 8.4–10.5)
Chloride: 96 mEq/L (ref 96–112)
Creatinine, Ser: 0.56 mg/dL (ref 0.50–1.35)
GLUCOSE: 81 mg/dL (ref 70–99)
POTASSIUM: 4.2 meq/L (ref 3.7–5.3)
Sodium: 136 mEq/L — ABNORMAL LOW (ref 137–147)
TOTAL PROTEIN: 7 g/dL (ref 6.0–8.3)
Total Bilirubin: <0.2 mg/dL — ABNORMAL LOW (ref 0.3–1.2)

## 2013-09-18 LAB — LIPASE, BLOOD: Lipase: 23 U/L (ref 11–59)

## 2013-09-18 MED ORDER — SODIUM CHLORIDE 0.9 % IV BOLUS (SEPSIS)
1000.0000 mL | Freq: Once | INTRAVENOUS | Status: AC
  Administered 2013-09-18: 1000 mL via INTRAVENOUS

## 2013-09-18 MED ORDER — TAMSULOSIN HCL 0.4 MG PO CAPS: 0.4000 mg | ORAL_CAPSULE | Freq: Every day | ORAL | Status: DC

## 2013-09-18 NOTE — ED Notes (Signed)
Bladder scan shows only 37ml of urine. EDP/PA notified.

## 2013-09-18 NOTE — Discharge Instructions (Signed)
You ED workup today is complete.  We believe that your symptoms are from an enlarged prostate.  You have been given a catheter to aide with urination.  It is important that you follow-up with the UROLOGIST listed above.  Please return for any new or worsening symptoms.  Acute Urinary Retention, Male Acute urinary retention is the temporary inability to urinate. This is a common problem in older men. As men age their prostates become larger and block the flow of urine from the bladder. This is usually a problem that has come on gradually.  HOME CARE INSTRUCTIONS If you are sent home with a Foley catheter and a drainage system, you will need to discuss the best course of action with your health care provider. While the catheter is in, maintain a good intake of fluids. Keep the drainage bag emptied and lower than your catheter. This is so that contaminated urine will not flow back into your bladder, which could lead to a urinary tract infection. There are two main types of drainage bags. One is a large bag that usually is used at night. It has a good capacity that will allow you to sleep through the night without having to empty it. The second type is called a leg bag. It has a smaller capacity, so it needs to be emptied more frequently. However, the main advantage is that it can be attached by a leg strap and can go underneath your clothing, allowing you the freedom to move about or leave your home. Only take over-the-counter or prescription medicines for pain, discomfort, or fever as directed by your health care provider.  SEEK MEDICAL CARE IF:  You develop a low-grade fever.  You experience spasms or leakage of urine with the spasms. SEEK IMMEDIATE MEDICAL CARE IF:   You develop chills or fever.  Your catheter stops draining urine.  Your catheter falls out.  You start to develop increased bleeding that does not respond to rest and increased fluid intake. MAKE SURE YOU:  Understand these  instructions.  Will watch your condition.  Will get help right away if you are not doing well or get worse. Document Released: 09/25/2000 Document Revised: 02/19/2013 Document Reviewed: 11/28/2012 [facility] Patient Information 2014 [place], [place].

## 2013-09-18 NOTE — ED Provider Notes (Signed)
Medical screening examination/treatment/procedure(s) were conducted as a shared visit with non-physician practitioner(s) and myself.  I personally evaluated the patient during the encounter.   EKG Interpretation None       Patient with multiple chronic medical problems, here with difficulty urinating. Mild suprapubic pain. Rectal exam by Ivar Drapeob Browning, PA-C, shows enlarged prostate, not boggy, not painful. In/out cath with 500cc of urine. Urine clear. Foley placed. No concern for infection or prostatitis. Dahlia ClientBrowning spoke with patient's mother on f/u with Urology or PCP and gave her Foley instructions. Put on Flomax.  Dagmar HaitWilliam Karolyne Timmons, MD 09/18/13 [phone]
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
## 2013-09-18 NOTE — ED Notes (Signed)
Patient arrived via [facility] from home with difficulty urinating and abdominal pain. Patient has a peg tube in place. Patient states he has had vomited today, denies diarrhea at this time. VSS, A/O.

## 2013-09-18 NOTE — ED Notes (Signed)
Foley catheter emptied prior to transport.

## 2013-09-18 NOTE — ED Notes (Signed)
Patients family being contacted by EDP/PA to discuss patient status, discharge instructions and prescription.

## 2013-09-19 ENCOUNTER — Telehealth: Payer: Self-pay | Admitting: Neurology

## 2013-09-19 NOTE — Telephone Encounter (Signed)
I called Alexander [name] back and made appt for pt on Monday with Dr. Frances FurbishAthar for the lack of coordination and decreased muscle control.  Pt seen yesterday for urinary problem.  Did not address these issues in the ED?   Randy familiar with pt and does notice change.  The last 3 days has been change.  I made appt for Monday at 1100.   He would relay to the pt.

## 2013-09-19 NOTE — Telephone Encounter (Signed)
Harvie Heckandy from [facility] calling to state that patient is having increased full body tremors and muscle weakness, states that he has total loss of control and cannot stand and needs to be seen as soon as possible. Patient went to the ER but Harvie HeckRandy states they were not able to do much for him. Please call Harvie HeckRandy back as soon as possible.

## 2013-09-22 ENCOUNTER — Encounter: Payer: Self-pay | Admitting: Neurology

## 2013-09-22 ENCOUNTER — Ambulatory Visit (INDEPENDENT_AMBULATORY_CARE_PROVIDER_SITE_OTHER): Payer: Medicare Other | Admitting: Neurology

## 2013-09-22 VITALS — BP 113/78 | HR 114 | Temp 99.7°F

## 2013-09-22 DIAGNOSIS — M4712 Other spondylosis with myelopathy, cervical region: Secondary | ICD-10-CM

## 2013-09-22 DIAGNOSIS — R1310 Dysphagia, unspecified: Secondary | ICD-10-CM

## 2013-09-22 DIAGNOSIS — R339 Retention of urine, unspecified: Secondary | ICD-10-CM

## 2013-09-22 DIAGNOSIS — G959 Disease of spinal cord, unspecified: Secondary | ICD-10-CM

## 2013-09-22 DIAGNOSIS — G40909 Epilepsy, unspecified, not intractable, without status epilepticus: Secondary | ICD-10-CM

## 2013-09-22 NOTE — Patient Instructions (Addendum)
I do not think we need to change your medications. Use your wheelchair or walker at all times. Your exam is stable for me.
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
## 2013-10-09 ENCOUNTER — Ambulatory Visit: Payer: Medicare Other | Admitting: Neurology

## 2013-10-10 ENCOUNTER — Emergency Department (HOSPITAL_COMMUNITY): Payer: Medicare Other

## 2013-10-10 ENCOUNTER — Inpatient Hospital Stay (HOSPITAL_COMMUNITY)
Admission: EM | Admit: 2013-10-10 | Discharge: 2013-10-14 | DRG: 178 | Disposition: A | Discharge status: Home Health | Payer: Medicare Other | Attending: Internal Medicine | Admitting: Internal Medicine

## 2013-10-10 ENCOUNTER — Encounter (HOSPITAL_COMMUNITY): Payer: Self-pay | Admitting: Emergency Medicine

## 2013-10-10 DIAGNOSIS — G959 Disease of spinal cord, unspecified: Secondary | ICD-10-CM

## 2013-10-10 DIAGNOSIS — R339 Retention of urine, unspecified: Secondary | ICD-10-CM | POA: Diagnosis present

## 2013-10-10 DIAGNOSIS — R569 Unspecified convulsions: Secondary | ICD-10-CM

## 2013-10-10 DIAGNOSIS — Z8673 Personal history of transient ischemic attack (TIA), and cerebral infarction without residual deficits: Secondary | ICD-10-CM

## 2013-10-10 DIAGNOSIS — G608 Other hereditary and idiopathic neuropathies: Secondary | ICD-10-CM | POA: Diagnosis present

## 2013-10-10 DIAGNOSIS — G40909 Epilepsy, unspecified, not intractable, without status epilepticus: Secondary | ICD-10-CM | POA: Diagnosis present

## 2013-10-10 DIAGNOSIS — J690 Pneumonitis due to inhalation of food and vomit: Principal | ICD-10-CM | POA: Diagnosis present

## 2013-10-10 DIAGNOSIS — I10 Essential (primary) hypertension: Secondary | ICD-10-CM | POA: Diagnosis present

## 2013-10-10 DIAGNOSIS — Z6827 Body mass index (BMI) 27.0-27.9, adult: Secondary | ICD-10-CM

## 2013-10-10 DIAGNOSIS — Z87820 Personal history of traumatic brain injury: Secondary | ICD-10-CM

## 2013-10-10 DIAGNOSIS — Z931 Gastrostomy status: Secondary | ICD-10-CM

## 2013-10-10 DIAGNOSIS — F3289 Other specified depressive episodes: Secondary | ICD-10-CM | POA: Diagnosis present

## 2013-10-10 DIAGNOSIS — R1310 Dysphagia, unspecified: Secondary | ICD-10-CM

## 2013-10-10 DIAGNOSIS — E7800 Pure hypercholesterolemia, unspecified: Secondary | ICD-10-CM | POA: Diagnosis present

## 2013-10-10 DIAGNOSIS — Z87891 Personal history of nicotine dependence: Secondary | ICD-10-CM

## 2013-10-10 DIAGNOSIS — M4712 Other spondylosis with myelopathy, cervical region: Secondary | ICD-10-CM

## 2013-10-10 DIAGNOSIS — F32A Depression, unspecified: Secondary | ICD-10-CM

## 2013-10-10 DIAGNOSIS — E119 Type 2 diabetes mellitus without complications: Secondary | ICD-10-CM | POA: Insufficient documentation

## 2013-10-10 DIAGNOSIS — J189 Pneumonia, unspecified organism: Secondary | ICD-10-CM | POA: Diagnosis present

## 2013-10-10 DIAGNOSIS — Z8701 Personal history of pneumonia (recurrent): Secondary | ICD-10-CM

## 2013-10-10 DIAGNOSIS — R0902 Hypoxemia: Secondary | ICD-10-CM | POA: Diagnosis present

## 2013-10-10 DIAGNOSIS — F329 Major depressive disorder, single episode, unspecified: Secondary | ICD-10-CM | POA: Diagnosis present

## 2013-10-10 LAB — BASIC METABOLIC PANEL
BUN: 7 mg/dL (ref 6–23)
CHLORIDE: 99 meq/L (ref 96–112)
CO2: 27 mEq/L (ref 19–32)
Calcium: 9.6 mg/dL (ref 8.4–10.5)
Creatinine, Ser: 0.48 mg/dL — ABNORMAL LOW (ref 0.50–1.35)
GFR calc non Af Amer: >90 mL/min (ref >90)
Glucose, Bld: 88 mg/dL (ref 70–99)
POTASSIUM: 4.3 meq/L (ref 3.7–5.3)
Sodium: 140 mEq/L (ref 137–147)

## 2013-10-10 LAB — CBC WITH DIFFERENTIAL/PLATELET
BASOS ABS: 0 10*3/uL (ref 0.0–0.1)
BASOS PCT: 0 % (ref 0–1)
Eosinophils Absolute: 0.6 10*3/uL (ref 0.0–0.7)
Eosinophils Relative: 6 % — ABNORMAL HIGH (ref 0–5)
HEMATOCRIT: 44.7 % (ref 39.0–52.0)
HEMOGLOBIN: 14.8 g/dL (ref 13.0–17.0)
Lymphocytes Relative: 24 % (ref 12–46)
Lymphs Abs: 2.2 10*3/uL (ref 0.7–4.0)
MCH: 30.8 pg (ref 26.0–34.0)
MCHC: 33.1 g/dL (ref 30.0–36.0)
MCV: 93.1 fL (ref 78.0–100.0)
MONOS PCT: 6 % (ref 3–12)
Monocytes Absolute: 0.5 10*3/uL (ref 0.1–1.0)
NEUTROS ABS: 5.6 10*3/uL (ref 1.7–7.7)
NEUTROS PCT: 63 % (ref 43–77)
Platelets: 192 10*3/uL (ref 150–400)
RBC: 4.8 MIL/uL (ref 4.22–5.81)
RDW: 13.4 % (ref 11.5–15.5)
WBC: 8.8 10*3/uL (ref 4.0–10.5)

## 2013-10-10 LAB — I-STAT TROPONIN, ED: TROPONIN I, POC: 0 ng/mL (ref 0.00–0.08)

## 2013-10-10 LAB — GLUCOSE, CAPILLARY: GLUCOSE-CAPILLARY: 94 mg/dL (ref 70–99)

## 2013-10-10 LAB — PRO B NATRIURETIC PEPTIDE: Pro B Natriuretic peptide (BNP): <5 pg/mL (ref 0–125)

## 2013-10-10 LAB — I-STAT CG4 LACTIC ACID, ED: LACTIC ACID, VENOUS: 1.15 mmol/L (ref 0.5–2.2)

## 2013-10-10 MED ORDER — IPRATROPIUM BROMIDE 0.02 % IN SOLN
0.5000 mg | Freq: Once | RESPIRATORY_TRACT | Status: AC
  Administered 2013-10-10: 0.5 mg via RESPIRATORY_TRACT
  Filled 2013-10-10: qty 2.5

## 2013-10-10 MED ORDER — TRAZODONE HCL 50 MG PO TABS
50.0000 mg | ORAL_TABLET | Freq: Every day | ORAL | Status: DC
  Administered 2013-10-10 – 2013-10-13 (×4): 50 mg
  Filled 2013-10-10 (×6): qty 1

## 2013-10-10 MED ORDER — GABAPENTIN 250 MG/5ML PO SOLN
600.0000 mg | Freq: Every day | ORAL | Status: DC
  Administered 2013-10-10 – 2013-10-12 (×3): 600 mg
  Filled 2013-10-10 (×5): qty 12

## 2013-10-10 MED ORDER — ALBUTEROL SULFATE (2.5 MG/3ML) 0.083% IN NEBU
5.0000 mg | INHALATION_SOLUTION | Freq: Once | RESPIRATORY_TRACT | Status: AC
  Administered 2013-10-10: 5 mg via RESPIRATORY_TRACT
  Filled 2013-10-10: qty 6

## 2013-10-10 MED ORDER — VANCOMYCIN HCL IN DEXTROSE 1-5 GM/200ML-% IV SOLN
1000.0000 mg | Freq: Three times a day (TID) | INTRAVENOUS | Status: DC
  Administered 2013-10-11 – 2013-10-12 (×5): 1000 mg via INTRAVENOUS
  Filled 2013-10-10 (×7): qty 200

## 2013-10-10 MED ORDER — POLYETHYLENE GLYCOL 3350 17 G PO PACK
17.0000 g | PACK | Freq: Every day | ORAL | Status: DC | PRN
  Administered 2013-10-11 – 2013-10-12 (×2): 17 g
  Filled 2013-10-10 (×2): qty 1

## 2013-10-10 MED ORDER — ENOXAPARIN SODIUM 40 MG/0.4ML ~~LOC~~ SOLN
40.0000 mg | Freq: Every day | SUBCUTANEOUS | Status: DC
  Administered 2013-10-10 – 2013-10-13 (×4): 40 mg via SUBCUTANEOUS
  Filled 2013-10-10 (×5): qty 0.4

## 2013-10-10 MED ORDER — PANTOPRAZOLE SODIUM 40 MG PO TBEC: 40.0000 mg | DELAYED_RELEASE_TABLET | Freq: Every day | ORAL | Status: DC

## 2013-10-10 MED ORDER — VENLAFAXINE HCL ER 37.5 MG PO CP24
37.5000 mg | ORAL_CAPSULE | Freq: Every day | ORAL | Status: DC
  Administered 2013-10-11 – 2013-10-14 (×4): 37.5 mg via ORAL
  Filled 2013-10-10 (×4): qty 1

## 2013-10-10 MED ORDER — PANTOPRAZOLE SODIUM 40 MG PO PACK
40.0000 mg | PACK | Freq: Every day | ORAL | Status: DC
  Administered 2013-10-11 – 2013-10-14 (×4): 40 mg
  Filled 2013-10-10 (×4): qty 20

## 2013-10-10 MED ORDER — NAPHAZOLINE HCL 0.1 % OP SOLN
1.0000 [drp] | Freq: Four times a day (QID) | OPHTHALMIC | Status: DC | PRN
  Administered 2013-10-10: 1 [drp] via OPHTHALMIC
  Filled 2013-10-10: qty 15

## 2013-10-10 MED ORDER — DEXTROSE 5 % IV SOLN
1.0000 g | Freq: Once | INTRAVENOUS | Status: AC
  Administered 2013-10-10: 1 g via INTRAVENOUS
  Filled 2013-10-10: qty 1

## 2013-10-10 MED ORDER — HYDROCODONE-ACETAMINOPHEN 5-325 MG PO TABS
1.0000 | ORAL_TABLET | ORAL | Status: DC | PRN
  Administered 2013-10-10 – 2013-10-14 (×12): 1 via ORAL
  Filled 2013-10-10 (×10): qty 1
  Filled 2013-10-10: qty 2
  Filled 2013-10-10 (×3): qty 1

## 2013-10-10 MED ORDER — GABAPENTIN 250 MG/5ML PO SOLN
900.0000 mg | Freq: Every day | ORAL | Status: DC
  Administered 2013-10-11 – 2013-10-14 (×4): 900 mg
  Filled 2013-10-10 (×5): qty 18

## 2013-10-10 MED ORDER — ALLOPURINOL 300 MG PO TABS
300.0000 mg | ORAL_TABLET | Freq: Every day | ORAL | Status: DC
  Administered 2013-10-11 – 2013-10-14 (×4): 300 mg
  Filled 2013-10-10 (×4): qty 1

## 2013-10-10 MED ORDER — CITALOPRAM HYDROBROMIDE 40 MG PO TABS
40.0000 mg | ORAL_TABLET | Freq: Every day | ORAL | Status: DC
  Administered 2013-10-11 – 2013-10-13 (×3): 40 mg
  Filled 2013-10-10 (×5): qty 1

## 2013-10-10 MED ORDER — VANCOMYCIN HCL IN DEXTROSE 1-5 GM/200ML-% IV SOLN
1000.0000 mg | INTRAVENOUS | Status: AC
  Administered 2013-10-10: 1000 mg via INTRAVENOUS
  Filled 2013-10-10: qty 200

## 2013-10-10 MED ORDER — GABAPENTIN 300 MG PO CAPS: 600.0000 mg | ORAL_CAPSULE | Freq: Two times a day (BID) | ORAL | Status: DC

## 2013-10-10 MED ORDER — CYCLOBENZAPRINE HCL 10 MG PO TABS
10.0000 mg | ORAL_TABLET | Freq: Two times a day (BID) | ORAL | Status: DC
  Administered 2013-10-10 – 2013-10-14 (×8): 10 mg
  Filled 2013-10-10 (×9): qty 1

## 2013-10-10 MED ORDER — TAMSULOSIN HCL 0.4 MG PO CAPS
0.4000 mg | ORAL_CAPSULE | Freq: Every day | ORAL | Status: DC
  Administered 2013-10-11 – 2013-10-14 (×4): 0.4 mg via ORAL
  Filled 2013-10-10 (×4): qty 1

## 2013-10-10 MED ORDER — IOHEXOL 350 MG/ML SOLN
100.0000 mL | Freq: Once | INTRAVENOUS | Status: AC | PRN
  Administered 2013-10-10: 100 mL via INTRAVENOUS

## 2013-10-10 MED ORDER — INSULIN ASPART 100 UNIT/ML ~~LOC~~ SOLN: 0.0000 [IU] | SUBCUTANEOUS | Status: DC

## 2013-10-10 MED ORDER — LACOSAMIDE 50 MG PO TABS
100.0000 mg | ORAL_TABLET | Freq: Two times a day (BID) | ORAL | Status: DC
  Administered 2013-10-11 – 2013-10-13 (×7): 100 mg
  Filled 2013-10-10 (×7): qty 2

## 2013-10-10 MED ORDER — SODIUM CHLORIDE 0.9 % IV SOLN
INTRAVENOUS | Status: DC
  Administered 2013-10-10: 22:00:00 via INTRAVENOUS

## 2013-10-10 MED ORDER — DEXTROSE 5 % IV SOLN
1.0000 g | Freq: Three times a day (TID) | INTRAVENOUS | Status: DC
  Administered 2013-10-11 – 2013-10-13 (×7): 1 g via INTRAVENOUS
  Filled 2013-10-10 (×9): qty 1

## 2013-10-10 MED ORDER — MELOXICAM 15 MG PO TABS
15.0000 mg | ORAL_TABLET | Freq: Every day | ORAL | Status: DC
  Administered 2013-10-10 – 2013-10-13 (×4): 15 mg
  Filled 2013-10-10 (×5): qty 1
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
## 2013-10-10 NOTE — ED Notes (Signed)
Patient transported to CT 

## 2013-10-10 NOTE — ED Notes (Signed)
Per EMS, Pt from home.  Pt c/o difficulty breathing today. Pt room air sats 85% on arrival by EMS.  95% with 2l per [place].  Hx stroke. Pt nonverbal at baseline from stroke and gestures with hands.  Some sign language.  Some changes in lung sounds.  Vitals: hr 84, resp 16, 116/80.
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
## 2013-10-10 NOTE — ED Notes (Signed)
Bed: WA07 Expected date:  Expected time:  Means of arrival:  Comments: EMS-SOB 
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
## 2013-10-11 LAB — GLUCOSE, CAPILLARY
GLUCOSE-CAPILLARY: 104 mg/dL — AB (ref 70–99)
GLUCOSE-CAPILLARY: 89 mg/dL (ref 70–99)
GLUCOSE-CAPILLARY: 99 mg/dL (ref 70–99)
Glucose-Capillary: 104 mg/dL — ABNORMAL HIGH (ref 70–99)
Glucose-Capillary: 101 mg/dL — ABNORMAL HIGH (ref 70–99)

## 2013-10-11 LAB — COMPREHENSIVE METABOLIC PANEL
ALBUMIN: 3.4 g/dL — AB (ref 3.5–5.2)
ALK PHOS: 84 U/L (ref 39–117)
ALT: 15 U/L (ref 0–53)
AST: 16 U/L (ref 0–37)
BILIRUBIN TOTAL: 0.3 mg/dL (ref 0.3–1.2)
BUN: 6 mg/dL (ref 6–23)
CHLORIDE: 100 meq/L (ref 96–112)
CO2: 28 mEq/L (ref 19–32)
Calcium: 9.2 mg/dL (ref 8.4–10.5)
Creatinine, Ser: 0.47 mg/dL — ABNORMAL LOW (ref 0.50–1.35)
GFR calc Af Amer: >90 mL/min (ref >90)
GFR calc non Af Amer: >90 mL/min (ref >90)
Glucose, Bld: 89 mg/dL (ref 70–99)
Potassium: 4.1 mEq/L (ref 3.7–5.3)
SODIUM: 139 meq/L (ref 137–147)
TOTAL PROTEIN: 6.6 g/dL (ref 6.0–8.3)

## 2013-10-11 LAB — HEMOGLOBIN A1C
Hgb A1c MFr Bld: 5.2 % (ref <5.7)
Mean Plasma Glucose: 103 mg/dL (ref <117)

## 2013-10-11 LAB — LEGIONELLA ANTIGEN, URINE: Legionella Antigen, Urine: NEGATIVE

## 2013-10-11 LAB — STREP PNEUMONIAE URINARY ANTIGEN: Strep Pneumo Urinary Antigen: NEGATIVE

## 2013-10-11 LAB — PREALBUMIN: Prealbumin: 23.2 mg/dL (ref 17.0–34.0)

## 2013-10-11 MED ORDER — KCL IN DEXTROSE-NACL 20-5-0.45 MEQ/L-%-% IV SOLN
INTRAVENOUS | Status: DC
  Administered 2013-10-11: 13:00:00 via INTRAVENOUS
  Administered 2013-10-13: 20 mL/h via INTRAVENOUS
  Filled 2013-10-11 (×6): qty 1000

## 2013-10-11 NOTE — Progress Notes (Signed)
Call received from Dr. Cena BentonVega to place an order for modified barium swallow for pt. Order placed. Vwilliams,rn.
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
## 2013-10-12 DIAGNOSIS — R1310 Dysphagia, unspecified: Secondary | ICD-10-CM

## 2013-10-12 DIAGNOSIS — F3289 Other specified depressive episodes: Secondary | ICD-10-CM

## 2013-10-12 DIAGNOSIS — F329 Major depressive disorder, single episode, unspecified: Secondary | ICD-10-CM

## 2013-10-12 LAB — GLUCOSE, CAPILLARY
GLUCOSE-CAPILLARY: 114 mg/dL — AB (ref 70–99)
GLUCOSE-CAPILLARY: 95 mg/dL (ref 70–99)
GLUCOSE-CAPILLARY: 100 mg/dL — AB (ref 70–99)
GLUCOSE-CAPILLARY: 107 mg/dL — AB (ref 70–99)
GLUCOSE-CAPILLARY: 102 mg/dL — AB (ref 70–99)
Glucose-Capillary: 108 mg/dL — ABNORMAL HIGH (ref 70–99)
Glucose-Capillary: 117 mg/dL — ABNORMAL HIGH (ref 70–99)

## 2013-10-12 LAB — CREATININE, SERUM
Creatinine, Ser: 0.53 mg/dL (ref 0.50–1.35)
GFR calc non Af Amer: >90 mL/min (ref >90)

## 2013-10-12 LAB — VANCOMYCIN, TROUGH: VANCOMYCIN TR: 20.2 ug/mL — AB (ref 10.0–20.0)

## 2013-10-12 MED ORDER — JEVITY 1.5 CAL/FIBER PO LIQD
1000.0000 mL | ORAL | Status: DC
  Administered 2013-10-12: 18:00:00
  Administered 2013-10-13: 1000 mL
  Filled 2013-10-12 (×5): qty 1000

## 2013-10-12 MED ORDER — JEVITY 1.2 CAL PO LIQD
1000.0000 mL | ORAL | Status: DC
  Administered 2013-10-12: 1000 mL
  Filled 2013-10-12: qty 1000

## 2013-10-12 MED ORDER — VANCOMYCIN HCL IN DEXTROSE 750-5 MG/150ML-% IV SOLN
750.0000 mg | Freq: Three times a day (TID) | INTRAVENOUS | Status: DC
  Administered 2013-10-12 – 2013-10-13 (×2): 750 mg via INTRAVENOUS
  Filled 2013-10-12 (×3): qty 150
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
## 2013-10-12 NOTE — Progress Notes (Signed)
ANTIBIOTIC CONSULT NOTE - FOLLOW UP  Pharmacy Consult for Vancomycin and Cefepime Indication: HCAP  No Known Allergies  Patient Measurements: Height: 5\' 9"  (175.3 cm) Weight: 183 lb 13.8 oz (83.4 kg) IBW/kg (Calculated) : 70.7  Vital Signs: Temp: 97.8 F (36.6 C) (04/12 0956) Temp src: Oral (04/12 0956) BP: 109/73 mmHg (04/12 0956) Pulse Rate: 87 (04/12 0956) Intake/Output from previous day: 04/11 0701 - 04/12 0700 In: 1498.3 [I.V.:1498.3] Out: 1700 [Urine:1700] Intake/Output from this shift:    Labs:  Recent Labs  10/10/13 1653 10/11/13 0615  WBC 8.8  --   HGB 14.8  --   PLT 192  --   CREATININE 0.48* 0.47*   Estimated Creatinine Clearance: 104.3 ml/min (by C-G formula based on Cr of 0.47). No results found for this basename: VANCOTROUGH, VANCOPEAK, VANCORANDOM, GENTTROUGH, GENTPEAK, GENTRANDOM, TOBRATROUGH, TOBRAPEAK, TOBRARND, AMIKACINPEAK, AMIKACINTROU, AMIKACIN,  in the last 72 hours   Microbiology: No results found for this or any previous visit (from the past 720 hour(s)).  Anti-infectives: 4/10 >> Vanc >> 4/10 >> Cefepime >>  Assessment: [age] yoM presenting with shortness of breath. CT negative for PE, but show bilateral airspace consolidation favored to represent multifocal pneumonia and/or aspiration. Cefepime and Vancomycin ordered for empiric treatment of HCAP. Note patient was admitted in January for HCAP. Had E.coli grow in sputum during January admission (sensitive to Cefepime).  Tmax: afebrile WBC WNL Renal: SCr 0.47, CrCl > 100 ml/min  Cultures: 4/10 Blood x2: sent 4/11 urine strep/legionella: both neg Sputum: ordered  Goal of Therapy:  Vancomycin trough level 15-20 mcg/ml Cefepime dose per renal function  Plan:   Check vancomycin trough tonight before 7th dose  Continue current cefepime Follow up renal function & cultures, de-escalation of therapy  Loralee PacasErin Mats Jeanl[place]is, PharmD, BCPS Pager: [phone] 10/12/2013,10:13 AM

## 2013-10-12 NOTE — Progress Notes (Signed)
ANTIBIOTIC CONSULT NOTE - FOLLOW UP  Pharmacy Consult for vancomycin Indication: pneumonia  No Known Allergies  Patient Measurements: Height: 5\' 9"  (175.3 cm) Weight: 183 lb 13.8 oz (83.4 kg) IBW/kg (Calculated) : 70.7 Adjusted Body Weight:   Vital Signs: Temp: 98.4 F (36.9 C) (04/12 1537) Temp src: Oral (04/12 1537) BP: 110/77 mmHg (04/12 1537) Pulse Rate: 88 (04/12 1537) Intake/Output from previous day: 04/11 0701 - 04/12 0700 In: 1498.3 [I.V.:1498.3] Out: 1700 [Urine:1700] Intake/Output from this shift:    Labs:  Recent Labs  10/10/13 1653 10/11/13 0615 10/12/13 1930  WBC 8.8  --   --   HGB 14.8  --   --   PLT 192  --   --   CREATININE 0.48* 0.47* 0.53   Estimated Creatinine Clearance: 104.3 ml/min (by C-G formula based on Cr of 0.53).  Recent Labs  10/12/13 1938  VANCOTROUGH 20.2*     Microbiology: Recent Results (from the past 720 hour(s))  CULTURE, BLOOD (ROUTINE X 2)     Status: None   Collection Time    10/10/13  4:53 PM      Result Value Ref Range Status   Specimen Description BLOOD RIGHT FOREARM  [facility]   Final   Special Requests NONE   Final   Culture  Setup Time     Final   Value: 10/11/2013 01:02     [facility]   Culture     Final   Value:        BLOOD CULTURE RECEIVED NO GROWTH TO DATE CULTURE WILL BE HELD FOR 5 DAYS BEFORE ISSUING A FINAL NEGATIVE REPORT     [facility]   Report Status PENDING   Incomplete  CULTURE, BLOOD (ROUTINE X 2)     Status: None   Collection Time    10/10/13  4:57 PM      Result Value Ref Range Status   Specimen Description BLOOD LEFT HAND  [facility]   Final   Special Requests NONE   Final   Culture  Setup Time     Final   Value: 10/11/2013 01:03     [facility]formed [facility]   Culture     Final   Value:        BLOOD CULTURE RECEIVED NO GROWTH TO DATE CULTURE WILL BE HELD FOR 5 DAYS BEFORE ISSUING A FINAL NEGATIVE REPORT   [facility]   Report Status PENDING   Incomplete    Anti-infectives   Start     Dose/Rate Route Frequency Ordered Stop   10/12/13 2200  vancomycin (VANCOCIN) IVPB 750 mg/150 ml premix     750 mg 150 mL/hr over 60 Minutes Intravenous Every 8 hours 10/12/13 2046 10/19/13 2159   10/11/13 0600  ceFEPIme (MAXIPIME) 1 g in dextrose 5 % 50 mL IVPB     1 g 100 mL/hr over 30 Minutes Intravenous 3 times per day 10/10/13 2000 10/19/13 0559   10/11/13 0400  vancomycin (VANCOCIN) IVPB 1000 mg/200 mL premix  Status:  Discontinued     1,000 mg 200 mL/hr over 60 Minutes Intravenous Every 8 hours 10/10/13 1959 10/12/13 2045   10/10/13 2000  ceFEPIme (MAXIPIME) 1 g in dextrose 5 % 50 mL IVPB     1 g 100 mL/hr over 30 Minutes Intravenous  Once 10/10/13 1927 10/10/13 2042   10/10/13 2000  vancomycin (VANCOCIN) IVPB 1000 mg/200  mL premix     1,000 mg 200 mL/hr over 60 Minutes Intravenous STAT 10/10/13 1957 10/10/13 2157      Assessment: Patient with level just above goal.  2000 dose not given.  Goal of Therapy:  Vancomycin trough level 15-20 mcg/ml  Plan:  Measure antibiotic drug levels at steady state Follow up culture results Change vancomycin to 750mg  iv q8hr, next dose at 2200  [facility]. 10/12/2013,8:47 PM

## 2013-10-12 NOTE — Progress Notes (Signed)
SLP Cancellation Note  Patient Details Name: Alexander [name] MRN: [id number] DOB: [birth year]   Cancelled treatment:  Received order for MBS.  Evaluation to be completed on 10/13/13.   Moreen FowlerKaren Elfrieda Espino MS, CCC-SLP [phone] Lacinda AxonKaren H Zahli Vetsch 10/12/2013, 1:48 PM
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
## 2013-10-13 ENCOUNTER — Inpatient Hospital Stay (HOSPITAL_COMMUNITY): Payer: Medicare Other

## 2013-10-13 LAB — CBC
HCT: 42.8 % (ref 39.0–52.0)
Hemoglobin: 13.7 g/dL (ref 13.0–17.0)
MCH: 30.6 pg (ref 26.0–34.0)
MCHC: 32 g/dL (ref 30.0–36.0)
MCV: 95.7 fL (ref 78.0–100.0)
PLATELETS: 180 10*3/uL (ref 150–400)
RBC: 4.47 MIL/uL (ref 4.22–5.81)
RDW: 13.4 % (ref 11.5–15.5)
WBC: 6.6 10*3/uL (ref 4.0–10.5)

## 2013-10-13 LAB — BASIC METABOLIC PANEL
BUN: 9 mg/dL (ref 6–23)
CO2: 29 mEq/L (ref 19–32)
Calcium: 9 mg/dL (ref 8.4–10.5)
Chloride: 102 mEq/L (ref 96–112)
Creatinine, Ser: 0.53 mg/dL (ref 0.50–1.35)
Glucose, Bld: 83 mg/dL (ref 70–99)
POTASSIUM: 4.1 meq/L (ref 3.7–5.3)
SODIUM: 141 meq/L (ref 137–147)

## 2013-10-13 LAB — GLUCOSE, CAPILLARY
GLUCOSE-CAPILLARY: 110 mg/dL — AB (ref 70–99)
GLUCOSE-CAPILLARY: 120 mg/dL — AB (ref 70–99)
Glucose-Capillary: 108 mg/dL — ABNORMAL HIGH (ref 70–99)
Glucose-Capillary: 85 mg/dL (ref 70–99)

## 2013-10-13 MED ORDER — AMOXICILLIN-POT CLAVULANATE 400-57 MG/5ML PO SUSR
800.0000 mg | Freq: Two times a day (BID) | ORAL | Status: DC
  Administered 2013-10-13 – 2013-10-14 (×3): 800 mg
  Filled 2013-10-13 (×5): qty 10

## 2013-10-13 MED ORDER — AMOXICILLIN-POT CLAVULANATE 875-125 MG PO TABS: 1.0000 | ORAL_TABLET | Freq: Two times a day (BID) | ORAL | Status: DC

## 2013-10-13 MED ORDER — GLYCERIN (LAXATIVE) 2.1 G RE SUPP
1.0000 | Freq: Every day | RECTAL | Status: DC | PRN
  Administered 2013-10-13: 1 via RECTAL
  Filled 2013-10-13: qty 1
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
## 2013-10-13 NOTE — Progress Notes (Signed)
This shift pts urine output < 100 mls with condom cath in place.  Bladder scan resulted in 509 mls. Order for in /out cath. 500 mls urine expelled. Condom cath replaced . Will continue to monitor pt.
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
## 2013-10-14 DIAGNOSIS — J690 Pneumonitis due to inhalation of food and vomit: Secondary | ICD-10-CM | POA: Diagnosis present

## 2013-10-14 LAB — GLUCOSE, CAPILLARY
GLUCOSE-CAPILLARY: 107 mg/dL — AB (ref 70–99)
GLUCOSE-CAPILLARY: 92 mg/dL (ref 70–99)
Glucose-Capillary: 102 mg/dL — ABNORMAL HIGH (ref 70–99)

## 2013-10-14 MED ORDER — AMOXICILLIN-POT CLAVULANATE 400-57 MG/5ML PO SUSR: 800.0000 mg | Freq: Two times a day (BID) | ORAL | Status: DC

## 2013-10-14 NOTE — Discharge Instructions (Signed)
Aspiration Pneumonia Aspiration pneumonia is an infection in your lungs. It occurs when food, liquid, or stomach contents (vomit) are inhaled (aspirated) into your lungs. When these things get into your lungs, swelling (inflammation) and infection can occur. This can make it difficult for you to breathe. Aspiration pneumonia is a serious condition and can be life threatening. RISK FACTORS Aspiration pneumonia is more likely to occur when a person's cough (gag) reflex or ability to swallow has been decreased. Some things that can do this include:   Having a brain injury or disease, such as stroke, seizures, Parkinson disease, dementia, or amyotrophic lateral sclerosis (ALS).   Being given general anesthetic for procedures.   Being in a coma (unconscious).   Having a narrowing of the tube that carries food to the stomach (esophagus).   Drinking too much alcohol. If a person passes out and vomits, vomit can be swallowed into the lungs.   Taking certain medicines, such as tranquilizers or sedatives.  SIGNS AND SYMPTOMS   Coughing after swallowing food or liquids.   Breathing problems, such as wheezing or shortness of breath.   Bluish skin. This can be caused by lack of oxygen.   Coughing up food or mucus. The mucus might contain blood, greenish material, or yellowish-white fluid (pus).   Fever.   Chest pain.   Being more tired than usual (fatigue).   Sweating more than usual.   Bad breath.  DIAGNOSIS  A physical exam will be done. During the exam, the health care provider will listen to your lungs with a stethoscope to check for:   Crackling sounds in the lungs.  Decreased breath sounds.  A rapid heartbeat. Various tests may be ordered. These may include:   Chest X-ray.   CT scan.   Swallowing study. This test looks at how food is swallowed and whether it goes into your breathing tube (trachea) or food pipe (esophagus).   Sputum culture. Saliva and  mucus (sputum) are collected from the lungs or the tubes that carry air to the lungs (bronchi). The sputum is then tested for bacteria.   Bronchoscopy. This test uses a flexible tube (bronchoscope) to see inside the lungs. TREATMENT  Treatment will usually include antibiotic medicines. Other medicines may also be used to reduce fever or pain. You may need to be treated in the hospital. In the hospital, your breathing will be carefully monitored. Depending on how well you are breathing, you may need to be given oxygen, or you may need breathing support from a breathing machine (ventilator). For people who fail a swallowing study, a feeding tube might be placed in the stomach, or they may be asked to avoid certain food textures or liquids when they eat. HOME CARE INSTRUCTIONS   Carefully follow any special eating instructions you were given. This reduces the risk of developing aspiration pneumonia again.  Only take over-the-counter or prescription medicines as directed by your health care provider. Follow the directions carefully.   If you were prescribed antibiotics, take them as directed. Finish them even if you start to feel better.   Rest as instructed by your health care provider.   Keep all follow-up appointments with your health care provider.  SEEK MEDICAL CARE IF:   You develop worsening shortness of breath, wheezing, or difficulty breathing.   You develop a fever.   You have chest pain.  MAKE SURE YOU:   Understand these instructions.  Will watch your condition.  Will get help right away if you  are not doing well or get worse. Document Released: 04/16/2009 Document Revised: 02/19/2013 Document Reviewed: 12/05/2012 [facility] Patient Information 2014 [employer], [place].
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
## 2013-10-14 NOTE — Progress Notes (Signed)
Patient was 94 % on room air.  Philomena Dohenyavid Dwain Huhn RN

## 2013-10-17 LAB — CULTURE, BLOOD (ROUTINE X 2)
Culture: NO GROWTH
Culture: NO GROWTH

## 2013-12-17 ENCOUNTER — Emergency Department (HOSPITAL_COMMUNITY)
Admission: EM | Admit: 2013-12-17 | Discharge: 2013-12-17 | Disposition: A | Discharge status: Home / Self Care | Payer: Medicare HMO | Attending: Emergency Medicine | Admitting: Emergency Medicine

## 2013-12-17 ENCOUNTER — Encounter (HOSPITAL_COMMUNITY): Payer: Self-pay | Admitting: Emergency Medicine

## 2013-12-17 DIAGNOSIS — G40909 Epilepsy, unspecified, not intractable, without status epilepticus: Secondary | ICD-10-CM | POA: Insufficient documentation

## 2013-12-17 DIAGNOSIS — R29898 Other symptoms and signs involving the musculoskeletal system: Secondary | ICD-10-CM | POA: Diagnosis not present

## 2013-12-17 DIAGNOSIS — H6090 Unspecified otitis externa, unspecified ear: Secondary | ICD-10-CM

## 2013-12-17 DIAGNOSIS — Z87891 Personal history of nicotine dependence: Secondary | ICD-10-CM | POA: Insufficient documentation

## 2013-12-17 DIAGNOSIS — I10 Essential (primary) hypertension: Secondary | ICD-10-CM | POA: Diagnosis not present

## 2013-12-17 DIAGNOSIS — F3289 Other specified depressive episodes: Secondary | ICD-10-CM | POA: Diagnosis not present

## 2013-12-17 DIAGNOSIS — Z8701 Personal history of pneumonia (recurrent): Secondary | ICD-10-CM | POA: Diagnosis not present

## 2013-12-17 DIAGNOSIS — Z87828 Personal history of other (healed) physical injury and trauma: Secondary | ICD-10-CM | POA: Diagnosis not present

## 2013-12-17 DIAGNOSIS — H60399 Other infective otitis externa, unspecified ear: Secondary | ICD-10-CM | POA: Insufficient documentation

## 2013-12-17 DIAGNOSIS — E119 Type 2 diabetes mellitus without complications: Secondary | ICD-10-CM | POA: Insufficient documentation

## 2013-12-17 DIAGNOSIS — F329 Major depressive disorder, single episode, unspecified: Secondary | ICD-10-CM | POA: Diagnosis not present

## 2013-12-17 DIAGNOSIS — H9209 Otalgia, unspecified ear: Secondary | ICD-10-CM | POA: Diagnosis present

## 2013-12-17 DIAGNOSIS — Z9889 Other specified postprocedural states: Secondary | ICD-10-CM | POA: Insufficient documentation

## 2013-12-17 DIAGNOSIS — Z8679 Personal history of other diseases of the circulatory system: Secondary | ICD-10-CM | POA: Insufficient documentation

## 2013-12-17 MED ORDER — HYDROCODONE-ACETAMINOPHEN 7.5-325 MG/15ML PO SOLN
10.0000 mL | Freq: Once | ORAL | Status: AC
  Administered 2013-12-17: 10 mL via ORAL
  Filled 2013-12-17: qty 15

## 2013-12-17 MED ORDER — ANTIPYRINE-BENZOCAINE 5.4-1.4 % OT SOLN: 3.0000 [drp] | OTIC | Status: DC | PRN

## 2013-12-17 MED ORDER — NEOMYCIN-POLYMYXIN-HC 3.5-10000-1 OT SUSP: 4.0000 [drp] | Freq: Three times a day (TID) | OTIC | Status: DC

## 2013-12-17 MED ORDER — ANTIPYRINE-BENZOCAINE 5.4-1.4 % OT SOLN
3.0000 [drp] | Freq: Once | OTIC | Status: AC
  Administered 2013-12-17: 3 [drp] via OTIC
  Filled 2013-12-17: qty 10
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
## 2013-12-17 NOTE — Discharge Instructions (Signed)
Otitis Externa Otitis externa is a bacterial or fungal infection of the outer ear canal. This is the area from the eardrum to the outside of the ear. Otitis externa is sometimes called "swimmer's ear." CAUSES  Possible causes of infection include:  Swimming in dirty water.  Moisture remaining in the ear after swimming or bathing.  Mild injury (trauma) to the ear.  Objects stuck in the ear (foreign body).  Cuts or scrapes (abrasions) on the outside of the ear. SYMPTOMS  The first symptom of infection is often itching in the ear canal. Later signs and symptoms may include swelling and redness of the ear canal, ear pain, and yellowish-white fluid (pus) coming from the ear. The ear pain may be worse when pulling on the earlobe. DIAGNOSIS  Your caregiver will perform a physical exam. A sample of fluid may be taken from the ear and examined for bacteria or fungi. TREATMENT  Antibiotic ear drops are often given for 10 to 14 days. Treatment may also include pain medicine or corticosteroids to reduce itching and swelling. PREVENTION   Keep your ear dry. Use the corner of a towel to absorb water out of the ear canal after swimming or bathing.  Avoid scratching or putting objects inside your ear. This can damage the ear canal or remove the protective wax that lines the canal. This makes it easier for bacteria and fungi to grow.  Avoid swimming in lakes, polluted water, or poorly chlorinated pools.  You may use ear drops made of rubbing alcohol and vinegar after swimming. Combine equal parts of white vinegar and alcohol in a bottle. Put 3 or 4 drops into each ear after swimming. HOME CARE INSTRUCTIONS   Apply antibiotic ear drops to the ear canal as prescribed by your caregiver.  Only take over-the-counter or prescription medicines for pain, discomfort, or fever as directed by your caregiver.  If you have diabetes, follow any additional treatment instructions from your caregiver.  Keep all  follow-up appointments as directed by your caregiver. SEEK MEDICAL CARE IF:   You have a fever.  Your ear is still red, swollen, painful, or draining pus after 3 days.  Your redness, swelling, or pain gets worse.  You have a severe headache.  You have redness, swelling, pain, or tenderness in the area behind your ear. MAKE SURE YOU:   Understand these instructions.  Will watch your condition.  Will get help right away if you are not doing well or get worse. Document Released: 06/19/2005 Document Revised: 09/11/2011 Document Reviewed: 07/06/2011 [facility] Patient Information 2015 [place], [place]. This information is not intended to replace advice given to you by your health care provider. Make sure you discuss any questions you have with your health care provider.  Ear Drops You have been diagnosed with a condition requiring you to put drops of medicine into your outer ear. HOME CARE INSTRUCTIONS   Put drops in the affected ear as instructed. After putting the drops in, you will need to lie down with the affected ear facing up for ten minutes so the drops will remain in the ear canal and run down and fill the canal. Continue using the ear drops for as long as directed by your health care provider.  Prior to getting up, put a cotton ball gently in your ear canal. Leave enough of the cotton ball out so it can be easily removed. Do not attempt to push this down into the canal with a cotton-tipped swab or other instrument.  Do not irrigate or wash out your ears if you have had a perforated eardrum or mastoid surgery, or unless instructed to do so by your health care provider.  Keep appointments with your health care provider as instructed.  Finish all medicine, or use for the length of time prescribed by your health care provider. Continue the drops even if your problem seems to be doing well after a couple days, or continue as instructed. SEEK MEDICAL CARE IF:  You become worse or  develop increasing pain.  You notice any unusual drainage from your ear (particularly if the drainage has a bad smell).  You develop hearing difficulties.  You experience a serious form of dizziness in which you feel as if the room is spinning, and you feel nauseated (vertigo).  The outside of your ear becomes red or swollen or both. This may be a sign of an allergic reaction. MAKE SURE YOU:   Understand these instructions.  Will watch your condition.  Will get help right away if you are not doing well or get worse. Document Released: 06/13/2001 Document Revised: 06/24/2013 Document Reviewed: 01/14/2013 [facility] Patient Information 2015 [place], [place]. This information is not intended to replace advice given to you by your health care provider. Make sure you discuss any questions you have with your health care provider.

## 2013-12-17 NOTE — ED Notes (Addendum)
Per EMS: pt arrives from home, caregiver states pt c/o left ear pain for "weeks" but has gotten worse since yesterday. 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 Pt denies any other c/o denies any fever. nad noted. Pt noted to have cotton inserted in ear upon arrival. Skin warm and dry. Pt has PEG tube.

## 2013-12-17 NOTE — ED Notes (Signed)
Residual of feeding tube checked, placement verified by auscultation.

## 2013-12-19 NOTE — ED Provider Notes (Signed)
Medical screening examination/treatment/procedure(s) were performed by non-physician practitioner and as supervising physician I was immediately available for consultation/collaboration.   EKG Interpretation None       Flint MelterElliott L Wentz, MD 12/19/13 1711

## 2014-02-17 ENCOUNTER — Ambulatory Visit: Payer: Medicare Other | Admitting: Neurology

## 2014-03-25 ENCOUNTER — Ambulatory Visit (INDEPENDENT_AMBULATORY_CARE_PROVIDER_SITE_OTHER): Payer: Medicare HMO | Admitting: Neurology

## 2014-03-25 ENCOUNTER — Encounter: Payer: Self-pay | Admitting: Neurology

## 2014-03-25 VITALS — BP 103/68 | HR 83 | Temp 98.1°F

## 2014-03-25 DIAGNOSIS — G40909 Epilepsy, unspecified, not intractable, without status epilepticus: Secondary | ICD-10-CM

## 2014-03-25 DIAGNOSIS — J189 Pneumonia, unspecified organism: Secondary | ICD-10-CM

## 2014-03-25 DIAGNOSIS — R471 Dysarthria and anarthria: Secondary | ICD-10-CM

## 2014-03-25 DIAGNOSIS — R339 Retention of urine, unspecified: Secondary | ICD-10-CM

## 2014-03-25 DIAGNOSIS — S12000S Unspecified displaced fracture of first cervical vertebra, sequela: Secondary | ICD-10-CM

## 2014-03-25 NOTE — Patient Instructions (Signed)
Do not eat or drink by mouth due to risk for aspiration and pneumonia.  We will keep your medications the same.
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
## 2014-04-11 ENCOUNTER — Emergency Department (HOSPITAL_COMMUNITY): Payer: Medicare HMO

## 2014-04-11 ENCOUNTER — Encounter (HOSPITAL_COMMUNITY): Payer: Self-pay | Admitting: Emergency Medicine

## 2014-04-11 ENCOUNTER — Emergency Department (HOSPITAL_COMMUNITY)
Admission: EM | Admit: 2014-04-11 | Discharge: 2014-04-11 | Disposition: A | Discharge status: Home / Self Care | Payer: Medicare HMO | Attending: Emergency Medicine | Admitting: Emergency Medicine

## 2014-04-11 DIAGNOSIS — Z79899 Other long term (current) drug therapy: Secondary | ICD-10-CM | POA: Insufficient documentation

## 2014-04-11 DIAGNOSIS — Z8781 Personal history of (healed) traumatic fracture: Secondary | ICD-10-CM | POA: Diagnosis not present

## 2014-04-11 DIAGNOSIS — Z7952 Long term (current) use of systemic steroids: Secondary | ICD-10-CM | POA: Diagnosis not present

## 2014-04-11 DIAGNOSIS — Z8669 Personal history of other diseases of the nervous system and sense organs: Secondary | ICD-10-CM | POA: Insufficient documentation

## 2014-04-11 DIAGNOSIS — Z8639 Personal history of other endocrine, nutritional and metabolic disease: Secondary | ICD-10-CM | POA: Diagnosis not present

## 2014-04-11 DIAGNOSIS — K9423 Gastrostomy malfunction: Secondary | ICD-10-CM | POA: Insufficient documentation

## 2014-04-11 DIAGNOSIS — Z8701 Personal history of pneumonia (recurrent): Secondary | ICD-10-CM | POA: Insufficient documentation

## 2014-04-11 DIAGNOSIS — F329 Major depressive disorder, single episode, unspecified: Secondary | ICD-10-CM | POA: Insufficient documentation

## 2014-04-11 DIAGNOSIS — M1990 Unspecified osteoarthritis, unspecified site: Secondary | ICD-10-CM | POA: Diagnosis not present

## 2014-04-11 DIAGNOSIS — Z431 Encounter for attention to gastrostomy: Secondary | ICD-10-CM

## 2014-04-11 DIAGNOSIS — E119 Type 2 diabetes mellitus without complications: Secondary | ICD-10-CM | POA: Diagnosis not present

## 2014-04-11 DIAGNOSIS — T85528A Displacement of other gastrointestinal prosthetic devices, implants and grafts, initial encounter: Secondary | ICD-10-CM

## 2014-04-11 DIAGNOSIS — I10 Essential (primary) hypertension: Secondary | ICD-10-CM | POA: Diagnosis not present

## 2014-04-11 DIAGNOSIS — Z791 Long term (current) use of non-steroidal anti-inflammatories (NSAID): Secondary | ICD-10-CM | POA: Insufficient documentation

## 2014-04-11 DIAGNOSIS — Z87820 Personal history of traumatic brain injury: Secondary | ICD-10-CM | POA: Insufficient documentation

## 2014-04-11 NOTE — ED Notes (Signed)
Pt. In xray 
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
## 2014-04-11 NOTE — ED Notes (Signed)
Pt. Coming from home. G tube came out. Alert and oriented at baseline. Denies pain.

## 2014-04-11 NOTE — Discharge Instructions (Signed)
Gastric Tube Replacement °You are having your gastric tube (the tube that goes into the stomach) changed. This is usually a very minor procedure. If medications are prescribed, take them as directed. Only take over-the-counter or prescription medications for pain, discomfort, or fever as directed by your caregiver.  °SEEK IMMEDIATE MEDICAL CARE IF:  °· You develop chills, fever, or show signs of generalized illness. °· You develop bleeding around the tube. °· Your new tube does not seem to be working properly. °· You are unable to get feedings into the tube. °· Your tube comes out for any reason. °Document Released: 03/14/2001 Document Revised: 09/11/2011 Document Reviewed: 06/19/2005 °ExitCare® Patient Information ©2015 [employer]. This information is not intended to replace advice given to you by your health care provider. Make sure you discuss any questions you have with your health care provider. ° °

## 2014-04-11 NOTE — ED Notes (Signed)
Called [facility] for transport.  

## 2014-04-16 ENCOUNTER — Encounter (HOSPITAL_COMMUNITY): Payer: Self-pay | Admitting: Emergency Medicine

## 2014-04-16 ENCOUNTER — Emergency Department (HOSPITAL_COMMUNITY)
Admission: EM | Admit: 2014-04-16 | Discharge: 2014-04-16 | Disposition: A | Discharge status: Home / Self Care | Payer: Medicare HMO | Attending: Emergency Medicine | Admitting: Emergency Medicine

## 2014-04-16 ENCOUNTER — Emergency Department (HOSPITAL_COMMUNITY): Payer: Medicare HMO

## 2014-04-16 DIAGNOSIS — M109 Gout, unspecified: Secondary | ICD-10-CM | POA: Insufficient documentation

## 2014-04-16 DIAGNOSIS — I10 Essential (primary) hypertension: Secondary | ICD-10-CM | POA: Insufficient documentation

## 2014-04-16 DIAGNOSIS — Z87828 Personal history of other (healed) physical injury and trauma: Secondary | ICD-10-CM | POA: Diagnosis not present

## 2014-04-16 DIAGNOSIS — Z8781 Personal history of (healed) traumatic fracture: Secondary | ICD-10-CM | POA: Insufficient documentation

## 2014-04-16 DIAGNOSIS — G40909 Epilepsy, unspecified, not intractable, without status epilepticus: Secondary | ICD-10-CM | POA: Insufficient documentation

## 2014-04-16 DIAGNOSIS — F329 Major depressive disorder, single episode, unspecified: Secondary | ICD-10-CM | POA: Insufficient documentation

## 2014-04-16 DIAGNOSIS — Z87891 Personal history of nicotine dependence: Secondary | ICD-10-CM | POA: Diagnosis not present

## 2014-04-16 DIAGNOSIS — M1990 Unspecified osteoarthritis, unspecified site: Secondary | ICD-10-CM | POA: Insufficient documentation

## 2014-04-16 DIAGNOSIS — E119 Type 2 diabetes mellitus without complications: Secondary | ICD-10-CM | POA: Insufficient documentation

## 2014-04-16 DIAGNOSIS — Z791 Long term (current) use of non-steroidal anti-inflammatories (NSAID): Secondary | ICD-10-CM | POA: Diagnosis not present

## 2014-04-16 DIAGNOSIS — Z87820 Personal history of traumatic brain injury: Secondary | ICD-10-CM | POA: Insufficient documentation

## 2014-04-16 DIAGNOSIS — Z8701 Personal history of pneumonia (recurrent): Secondary | ICD-10-CM | POA: Diagnosis not present

## 2014-04-16 DIAGNOSIS — Z79899 Other long term (current) drug therapy: Secondary | ICD-10-CM | POA: Diagnosis not present

## 2014-04-16 DIAGNOSIS — R51 Headache: Secondary | ICD-10-CM | POA: Insufficient documentation

## 2014-04-16 DIAGNOSIS — K9423 Gastrostomy malfunction: Secondary | ICD-10-CM

## 2014-04-16 MED ORDER — IOHEXOL 300 MG/ML  SOLN
50.0000 mL | Freq: Once | INTRAMUSCULAR | Status: AC | PRN
  Administered 2014-04-16: 50 mL via ORAL
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
## 2014-04-16 NOTE — ED Provider Notes (Signed)
Medical screening examination/treatment/procedure(s) were performed by non-physician practitioner and as supervising physician I was immediately available for consultation/collaboration.   EKG Interpretation None        Richardean Canalavid H Wilfred Siverson, MD 04/16/14 1536

## 2014-04-16 NOTE — ED Notes (Addendum)
Pt mother aware that patient is being transported home.

## 2014-04-16 NOTE — Discharge Instructions (Signed)
g-tube was exchanged for 89F as you had before, this should stop the leaking issues. Follow-up with primary care physician. Return to the ED for new concerns.

## 2014-04-16 NOTE — ED Notes (Signed)
Pt here via EMS from home with c/o leaking g tube when he coughs. Pt was seen here for same 5 days ago. Pt has home health CNA who was with pt and called PCP who advised to come here for eval. Pt in no pain and in NAD.

## 2014-05-10 ENCOUNTER — Emergency Department (HOSPITAL_COMMUNITY): Payer: Medicare HMO

## 2014-05-10 ENCOUNTER — Encounter (HOSPITAL_COMMUNITY): Payer: Self-pay | Admitting: Emergency Medicine

## 2014-05-10 ENCOUNTER — Emergency Department (HOSPITAL_COMMUNITY)
Admission: EM | Admit: 2014-05-10 | Discharge: 2014-05-11 | Disposition: A | Discharge status: Home / Self Care | Payer: Medicare HMO | Attending: Emergency Medicine | Admitting: Emergency Medicine

## 2014-05-10 DIAGNOSIS — Z87820 Personal history of traumatic brain injury: Secondary | ICD-10-CM | POA: Diagnosis not present

## 2014-05-10 DIAGNOSIS — R05 Cough: Secondary | ICD-10-CM

## 2014-05-10 DIAGNOSIS — Z8669 Personal history of other diseases of the nervous system and sense organs: Secondary | ICD-10-CM | POA: Diagnosis not present

## 2014-05-10 DIAGNOSIS — F329 Major depressive disorder, single episode, unspecified: Secondary | ICD-10-CM | POA: Diagnosis not present

## 2014-05-10 DIAGNOSIS — M109 Gout, unspecified: Secondary | ICD-10-CM | POA: Insufficient documentation

## 2014-05-10 DIAGNOSIS — Z09 Encounter for follow-up examination after completed treatment for conditions other than malignant neoplasm: Secondary | ICD-10-CM

## 2014-05-10 DIAGNOSIS — Z79899 Other long term (current) drug therapy: Secondary | ICD-10-CM | POA: Diagnosis not present

## 2014-05-10 DIAGNOSIS — E119 Type 2 diabetes mellitus without complications: Secondary | ICD-10-CM | POA: Diagnosis not present

## 2014-05-10 DIAGNOSIS — Z8701 Personal history of pneumonia (recurrent): Secondary | ICD-10-CM | POA: Insufficient documentation

## 2014-05-10 DIAGNOSIS — Z8781 Personal history of (healed) traumatic fracture: Secondary | ICD-10-CM | POA: Insufficient documentation

## 2014-05-10 DIAGNOSIS — Z87891 Personal history of nicotine dependence: Secondary | ICD-10-CM | POA: Insufficient documentation

## 2014-05-10 DIAGNOSIS — K9423 Gastrostomy malfunction: Secondary | ICD-10-CM | POA: Insufficient documentation

## 2014-05-10 DIAGNOSIS — R059 Cough, unspecified: Secondary | ICD-10-CM

## 2014-05-10 DIAGNOSIS — I10 Essential (primary) hypertension: Secondary | ICD-10-CM | POA: Insufficient documentation

## 2014-05-10 DIAGNOSIS — M1990 Unspecified osteoarthritis, unspecified site: Secondary | ICD-10-CM | POA: Insufficient documentation

## 2014-05-10 MED ORDER — IOHEXOL 300 MG/ML  SOLN
50.0000 mL | Freq: Once | INTRAMUSCULAR | Status: AC | PRN
  Administered 2014-05-10: 50 mL via ORAL

## 2014-05-10 NOTE — ED Notes (Signed)
Pt's feeding tube became dislodged tonight which is a common occurrence. States that the tube is not long enough for him to do  His own feeds and it becomes dislodged. Also notes productive cough x several days. Alert and oriented per norm.
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
## 2014-05-10 NOTE — ED Notes (Signed)
Bed: [id number] Expected date:  Expected time:  Means of arrival:  Comments: EMS 58 yo male/feeding tube fell out/productive cough x 2 days

## 2014-05-11 NOTE — Discharge Instructions (Signed)
Cough, Adult  A cough is a reflex. It helps you clear your throat and airways. A cough can help heal your body. A cough can last 2 or 3 weeks (acute) or may last more than 8 weeks (chronic). Some common causes of a cough can include an infection, allergy, or a cold. HOME CARE  Only take medicine as told by your doctor.  If given, take your medicines (antibiotics) as told. Finish them even if you start to feel better.  Use a cold steam vaporizer or humidifier in your home. This can help loosen thick spit (secretions).  Sleep so you are almost sitting up (semi-upright). Use pillows to do this. This helps reduce coughing.  Rest as needed.  Stop smoking if you smoke. GET HELP RIGHT AWAY IF:  You have yellowish-white fluid (pus) in your thick spit.  Your cough gets worse.  Your medicine does not reduce coughing, and you are losing sleep.  You cough up blood.  You have trouble breathing.  Your pain gets worse and medicine does not help.  You have a fever. MAKE SURE YOU:   Understand these instructions.  Will watch your condition.  Will get help right away if you are not doing well or get worse. Document Released: 03/02/2011 Document Revised: 11/03/2013 Document Reviewed: 03/02/2011 [facility] Patient Information 2015 [place]ExitCare, [place]. This information is not intended to replace advice given to you by your health care provider. Make sure you discuss any questions you have with your health care provider. Your G tube is in good position You do not have pneumonia on tonights xray

## 2014-05-13 ENCOUNTER — Other Ambulatory Visit: Payer: Self-pay | Admitting: Neurology

## 2014-05-24 ENCOUNTER — Encounter (HOSPITAL_COMMUNITY): Payer: Self-pay | Admitting: *Deleted

## 2014-05-24 ENCOUNTER — Emergency Department (HOSPITAL_COMMUNITY)
Admission: EM | Admit: 2014-05-24 | Discharge: 2014-05-24 | Disposition: A | Discharge status: Home / Self Care | Payer: Medicare HMO | Attending: Emergency Medicine | Admitting: Emergency Medicine

## 2014-05-24 ENCOUNTER — Emergency Department (HOSPITAL_COMMUNITY): Payer: Medicare HMO

## 2014-05-24 DIAGNOSIS — Z09 Encounter for follow-up examination after completed treatment for conditions other than malignant neoplasm: Secondary | ICD-10-CM

## 2014-05-24 DIAGNOSIS — Z87891 Personal history of nicotine dependence: Secondary | ICD-10-CM | POA: Diagnosis not present

## 2014-05-24 DIAGNOSIS — Z792 Long term (current) use of antibiotics: Secondary | ICD-10-CM | POA: Diagnosis not present

## 2014-05-24 DIAGNOSIS — M1990 Unspecified osteoarthritis, unspecified site: Secondary | ICD-10-CM | POA: Diagnosis not present

## 2014-05-24 DIAGNOSIS — F329 Major depressive disorder, single episode, unspecified: Secondary | ICD-10-CM | POA: Insufficient documentation

## 2014-05-24 DIAGNOSIS — Z79899 Other long term (current) drug therapy: Secondary | ICD-10-CM | POA: Diagnosis not present

## 2014-05-24 DIAGNOSIS — Z8673 Personal history of transient ischemic attack (TIA), and cerebral infarction without residual deficits: Secondary | ICD-10-CM | POA: Diagnosis not present

## 2014-05-24 DIAGNOSIS — M109 Gout, unspecified: Secondary | ICD-10-CM | POA: Insufficient documentation

## 2014-05-24 DIAGNOSIS — G629 Polyneuropathy, unspecified: Secondary | ICD-10-CM | POA: Diagnosis not present

## 2014-05-24 DIAGNOSIS — K9423 Gastrostomy malfunction: Secondary | ICD-10-CM | POA: Diagnosis not present

## 2014-05-24 DIAGNOSIS — I10 Essential (primary) hypertension: Secondary | ICD-10-CM | POA: Diagnosis not present

## 2014-05-24 DIAGNOSIS — Z87828 Personal history of other (healed) physical injury and trauma: Secondary | ICD-10-CM | POA: Diagnosis not present

## 2014-05-24 DIAGNOSIS — S069XAA Unspecified intracranial injury with loss of consciousness status unknown, initial encounter: Secondary | ICD-10-CM | POA: Insufficient documentation

## 2014-05-24 DIAGNOSIS — Z8701 Personal history of pneumonia (recurrent): Secondary | ICD-10-CM | POA: Diagnosis not present

## 2014-05-24 DIAGNOSIS — S069X9A Unspecified intracranial injury with loss of consciousness of unspecified duration, initial encounter: Secondary | ICD-10-CM | POA: Insufficient documentation

## 2014-05-24 DIAGNOSIS — E119 Type 2 diabetes mellitus without complications: Secondary | ICD-10-CM | POA: Diagnosis not present

## 2014-05-24 DIAGNOSIS — Z8781 Personal history of (healed) traumatic fracture: Secondary | ICD-10-CM | POA: Diagnosis not present

## 2014-05-24 DIAGNOSIS — H18609 Keratoconus, unspecified, unspecified eye: Secondary | ICD-10-CM | POA: Diagnosis not present

## 2014-05-24 MED ORDER — HYDROCODONE-ACETAMINOPHEN 5-325 MG PO TABS
2.0000 | ORAL_TABLET | Freq: Once | ORAL | Status: AC
  Administered 2014-05-24: 2 via ORAL
  Filled 2014-05-24: qty 2

## 2014-05-24 MED ORDER — IOHEXOL 300 MG/ML  SOLN
50.0000 mL | Freq: Once | INTRAMUSCULAR | Status: AC | PRN
  Administered 2014-05-24: 50 mL via ORAL

## 2014-05-24 NOTE — ED Notes (Signed)
[facility] present to transport patient back home

## 2014-05-24 NOTE — ED Notes (Signed)
[facility] "G-tube fell out" Per EMS, this is the 5th time this month that patient's G-tube needed to be replaced this month

## 2014-05-24 NOTE — ED Notes (Signed)
G-tube replaced by EDP Patient to be DC back home

## 2014-05-24 NOTE — ED Notes (Signed)
[facility] called and made aware of need to transport patient back to home

## 2014-05-24 NOTE — Discharge Instructions (Signed)
1. Medications: usual home medications 2. Treatment: rest, drink plenty of fluids,  3. Follow Up: Please followup with your primary doctor in 2 days for discussion of your diagnoses and further evaluation after today's visit; if you do not have a primary care doctor use the resource guide provided to find one; Please return to the ER for fever, chills, signs of infection or need for replacement G-tube again    Care of a Feeding Tube Feeding tubes are often given to those who have trouble swallowing or cannot take food or medicine. A feeding tube can:   Go into the nose and down to the stomach.  Go through the skin in the belly (abdomen) and into the stomach or small bowel. SUPPLIES NEEDED TO CARE FOR THE TUBE SITE  Clean gloves.  Clean wash cloth, gauze pads, or soft paper towel.  Cotton swabs.  Skin barrier ointment or cream.  Soap and water.  Precut foam pads or gauze (that go around the tube).  Tube tape. TUBE SITE CARE 1. Have all supplies ready. 2. Wash hands well. 3.  Put on clean gloves. 4. Remove dirty foam pads or gauze near the tube site, if present. 5. Check the skin around the tube site for redness, rash, puffiness (swelling), leaking fluid, or extra tissue growth. Call your doctor if you see any of these. 6. Wet the gauze and cotton swabs with water and soap. 7. Wipe the area closest to the tube with cotton swabs. Wipe the surrounding skin with moistened gauze. Rinse with water. 8. Dry the skin and tube site with a dry gauze pad or soft paper towel. Do not use antibiotic ointments at the tube site. 9. If the skin is red, apply petroleum jelly in a circular motion, using a cotton swab. Your doctor may suggest a different cream or ointment. Use what the doctor suggests. 10. Apply a new pre-cut foam pad or gauze around the tube. Tape the edges down. Foam pads or gauze may be left off if there is no fluid at the tube site. 11. Use tape or a device that will attach your  feeding tube to your skin or do as directed. Rotate where you tape the tube. 12. Sit the person up. 13. Throw away used supplies. 14. Remove gloves. 15. Wash hands. SUPPLIES NEEDED TO FLUSH A FEEDING TUBE  Clean gloves.  60 mL syringe (that connects to feeding tube).  Towel.  Water. FLUSHING A FEEDING TUBE  1. Have all supplies ready. 2. Wash hands well. 3. Put on clean gloves. 4. Pull 30 mL of water into the syringe. 5. Bend (kink) the feeding tube while disconnecting it from the feeding-bag tubing or while removing the plug at the end of the tube. 6. Insert the tip of the syringe into the end of the feeding tube. Stop bending the tube. Slowly inject the water. 7. If you cannot inject the water, the person with the feeding tube should lay on their left side. 8. After injecting the water, remove the syringe. 9. Always flush the tube before giving the first medicine, between medicines, and after the final medicine before starting a feeding. 10. Throw away used supplies. 11. Remove gloves. 12. Wash hands. Document Released: 03/13/2012 Document Reviewed: 03/13/2012 [facility]Exit[facility] Patient Information 2015 [place], [place]LL[place]. This information is not intended to replace advice given to you by your health care provider. Make sure you discuss any questions you have with your health care provider.
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
## 2014-05-24 NOTE — ED Notes (Signed)
Patient taken to radiology via stretcher Patient in NAD upon leaving for testing 

## 2014-05-24 NOTE — ED Notes (Signed)
Patient medicated for c/o pain at G-tube insertion site via G-tube, see MAR

## 2014-05-24 NOTE — ED Notes (Signed)
Patient seen here on 11/8 for same complaint Also seen multiple times in October for same

## 2014-06-25 ENCOUNTER — Encounter (HOSPITAL_COMMUNITY): Payer: Self-pay

## 2014-06-25 ENCOUNTER — Emergency Department (HOSPITAL_COMMUNITY): Payer: Medicare HMO

## 2014-06-25 ENCOUNTER — Emergency Department (HOSPITAL_COMMUNITY)
Admission: EM | Admit: 2014-06-25 | Discharge: 2014-06-25 | Disposition: A | Discharge status: Home / Self Care | Payer: Medicare HMO | Attending: Emergency Medicine | Admitting: Emergency Medicine

## 2014-06-25 DIAGNOSIS — M109 Gout, unspecified: Secondary | ICD-10-CM | POA: Insufficient documentation

## 2014-06-25 DIAGNOSIS — K5900 Constipation, unspecified: Secondary | ICD-10-CM

## 2014-06-25 DIAGNOSIS — Z9889 Other specified postprocedural states: Secondary | ICD-10-CM | POA: Diagnosis not present

## 2014-06-25 DIAGNOSIS — E119 Type 2 diabetes mellitus without complications: Secondary | ICD-10-CM | POA: Insufficient documentation

## 2014-06-25 DIAGNOSIS — Z9049 Acquired absence of other specified parts of digestive tract: Secondary | ICD-10-CM | POA: Diagnosis not present

## 2014-06-25 DIAGNOSIS — K5909 Other constipation: Secondary | ICD-10-CM | POA: Diagnosis not present

## 2014-06-25 DIAGNOSIS — Z8673 Personal history of transient ischemic attack (TIA), and cerebral infarction without residual deficits: Secondary | ICD-10-CM | POA: Insufficient documentation

## 2014-06-25 DIAGNOSIS — Z87891 Personal history of nicotine dependence: Secondary | ICD-10-CM | POA: Diagnosis not present

## 2014-06-25 DIAGNOSIS — M1990 Unspecified osteoarthritis, unspecified site: Secondary | ICD-10-CM | POA: Insufficient documentation

## 2014-06-25 DIAGNOSIS — Z8701 Personal history of pneumonia (recurrent): Secondary | ICD-10-CM | POA: Diagnosis not present

## 2014-06-25 DIAGNOSIS — R410 Disorientation, unspecified: Secondary | ICD-10-CM | POA: Diagnosis not present

## 2014-06-25 DIAGNOSIS — G629 Polyneuropathy, unspecified: Secondary | ICD-10-CM | POA: Insufficient documentation

## 2014-06-25 DIAGNOSIS — R110 Nausea: Secondary | ICD-10-CM | POA: Diagnosis not present

## 2014-06-25 DIAGNOSIS — G40909 Epilepsy, unspecified, not intractable, without status epilepticus: Secondary | ICD-10-CM | POA: Diagnosis not present

## 2014-06-25 DIAGNOSIS — Z79899 Other long term (current) drug therapy: Secondary | ICD-10-CM | POA: Diagnosis not present

## 2014-06-25 DIAGNOSIS — I10 Essential (primary) hypertension: Secondary | ICD-10-CM | POA: Diagnosis not present

## 2014-06-25 LAB — CBC WITH DIFFERENTIAL/PLATELET
BASOS ABS: 0 10*3/uL (ref 0.0–0.1)
BASOS PCT: 0 % (ref 0–1)
EOS ABS: 0.5 10*3/uL (ref 0.0–0.7)
EOS PCT: 9 % — AB (ref 0–5)
HCT: 40.6 % (ref 39.0–52.0)
Hemoglobin: 13.3 g/dL (ref 13.0–17.0)
LYMPHS ABS: 1.8 10*3/uL (ref 0.7–4.0)
Lymphocytes Relative: 28 % (ref 12–46)
MCH: 32 pg (ref 26.0–34.0)
MCHC: 32.8 g/dL (ref 30.0–36.0)
MCV: 97.8 fL (ref 78.0–100.0)
Monocytes Absolute: 0.5 10*3/uL (ref 0.1–1.0)
Monocytes Relative: 8 % (ref 3–12)
NEUTROS PCT: 55 % (ref 43–77)
Neutro Abs: 3.6 10*3/uL (ref 1.7–7.7)
Platelets: 137 10*3/uL — ABNORMAL LOW (ref 150–400)
RBC: 4.15 MIL/uL — ABNORMAL LOW (ref 4.22–5.81)
RDW: 13.5 % (ref 11.5–15.5)
WBC: 6.4 10*3/uL (ref 4.0–10.5)

## 2014-06-25 LAB — COMPREHENSIVE METABOLIC PANEL
ALBUMIN: 4 g/dL (ref 3.5–5.2)
ALT: 117 U/L — AB (ref 0–53)
AST: 69 U/L — ABNORMAL HIGH (ref 0–37)
Alkaline Phosphatase: 68 U/L (ref 39–117)
Anion gap: 7 (ref 5–15)
BUN: 10 mg/dL (ref 6–23)
CALCIUM: 8.9 mg/dL (ref 8.4–10.5)
CO2: 26 mmol/L (ref 19–32)
Chloride: 102 mEq/L (ref 96–112)
Creatinine, Ser: 0.44 mg/dL — ABNORMAL LOW (ref 0.50–1.35)
GFR calc Af Amer: >90 mL/min (ref >90)
GFR calc non Af Amer: >90 mL/min (ref >90)
GLUCOSE: 81 mg/dL (ref 70–99)
POTASSIUM: 4 mmol/L (ref 3.5–5.1)
SODIUM: 135 mmol/L (ref 135–145)
TOTAL PROTEIN: 6.6 g/dL (ref 6.0–8.3)
Total Bilirubin: 0.4 mg/dL (ref 0.3–1.2)

## 2014-06-25 LAB — LIPASE, BLOOD: Lipase: 18 U/L (ref 11–59)

## 2014-06-25 MED ORDER — IOHEXOL 300 MG/ML  SOLN
50.0000 mL | Freq: Once | INTRAMUSCULAR | Status: AC | PRN
  Administered 2014-06-25: 50 mL via ORAL

## 2014-06-25 MED ORDER — FLEET ENEMA 7-19 GM/118ML RE ENEM
1.0000 | ENEMA | Freq: Once | RECTAL | Status: AC
  Administered 2014-06-25: 1 via RECTAL
  Filled 2014-06-25: qty 1

## 2014-06-25 MED ORDER — POLYETHYLENE GLYCOL 3350 17 G PO PACK: 17.0000 g | PACK | Freq: Every day | ORAL | Status: DC

## 2014-06-25 NOTE — ED Notes (Signed)
Patient transported to X-ray 

## 2014-06-25 NOTE — ED Notes (Signed)
Small soft bowel movement
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
## 2014-06-25 NOTE — ED Notes (Signed)
Bed: [id number] Expected date:  Expected time:  Means of arrival:  Comments: EMS- seizure and constipation

## 2014-06-25 NOTE — ED Notes (Signed)
Per EMS, Pt, from home, c/o LUQ abdominal discomfort and constipation x 6 days.  Per EMS, Pt is difficult to understand and answers questions w/ "thumbs up and thumbs down."  EMS sts "the Home Healthcare nurse thinks that he may have had a seizure, due to a look in his eyes."  Sts Pt is A & oriented.  Pt does have a feeding tube.

## 2014-06-25 NOTE — Discharge Instructions (Signed)
Continue with miralax through G tube daily until soft bowel movements.   Follow up with your doctor.   Return to ER if you have severe pain, vomiting, worse constipation, fever.

## 2014-09-03 ENCOUNTER — Telehealth: Payer: Self-pay | Admitting: Neurology

## 2014-09-03 NOTE — Telephone Encounter (Signed)
Patient has rescheduled and confirmed appointment on 09/25/14

## 2014-09-25 ENCOUNTER — Ambulatory Visit (INDEPENDENT_AMBULATORY_CARE_PROVIDER_SITE_OTHER): Payer: Medicare HMO | Admitting: Neurology

## 2014-09-25 ENCOUNTER — Encounter: Payer: Self-pay | Admitting: Neurology

## 2014-09-25 VITALS — BP 116/84 | HR 87 | Resp 16

## 2014-09-25 DIAGNOSIS — J189 Pneumonia, unspecified organism: Secondary | ICD-10-CM

## 2014-09-25 DIAGNOSIS — S12000S Unspecified displaced fracture of first cervical vertebra, sequela: Secondary | ICD-10-CM | POA: Diagnosis not present

## 2014-09-25 DIAGNOSIS — R471 Dysarthria and anarthria: Secondary | ICD-10-CM | POA: Diagnosis not present

## 2014-09-25 DIAGNOSIS — K5909 Other constipation: Secondary | ICD-10-CM

## 2014-09-25 DIAGNOSIS — K5900 Constipation, unspecified: Secondary | ICD-10-CM | POA: Diagnosis not present

## 2014-09-25 DIAGNOSIS — G959 Disease of spinal cord, unspecified: Secondary | ICD-10-CM

## 2014-09-25 DIAGNOSIS — Z79899 Other long term (current) drug therapy: Secondary | ICD-10-CM | POA: Diagnosis not present

## 2014-09-25 DIAGNOSIS — G40909 Epilepsy, unspecified, not intractable, without status epilepticus: Secondary | ICD-10-CM

## 2014-09-25 MED ORDER — LACOSAMIDE 100 MG PO TABS: 100.0000 mg | ORAL_TABLET | Freq: Two times a day (BID) | ORAL | Status: DC

## 2014-09-25 MED ORDER — TOPIRAMATE 100 MG PO TABS: 100.0000 mg | ORAL_TABLET | Freq: Two times a day (BID) | ORAL | Status: AC
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
## 2014-09-25 NOTE — Patient Instructions (Signed)
Please check with your primary care provider if you need the Norco. I am not sure, how many times you take it.  We will do blood work for liver and kidney function and your platelets were somewhat low. We will call you for results.

## 2014-09-26 LAB — COMPREHENSIVE METABOLIC PANEL
A/G RATIO: 1.9 (ref 1.1–2.5)
ALT: 16 IU/L (ref 0–44)
AST: 22 IU/L (ref 0–40)
Albumin: 4.4 g/dL (ref 3.5–5.5)
Alkaline Phosphatase: 73 IU/L (ref 39–117)
BUN/Creatinine Ratio: 19 (ref 9–20)
BUN: 10 mg/dL (ref 6–24)
Bilirubin Total: 0.3 mg/dL (ref 0.0–1.2)
CO2: 25 mmol/L (ref 18–29)
CREATININE: 0.54 mg/dL — AB (ref 0.76–1.27)
Calcium: 9.3 mg/dL (ref 8.7–10.2)
Chloride: 95 mmol/L — ABNORMAL LOW (ref 97–108)
GFR calc Af Amer: 136 mL/min/{1.73_m2} (ref >59)
GFR, EST NON AFRICAN AMERICAN: 117 mL/min/{1.73_m2} (ref >59)
Globulin, Total: 2.3 g/dL (ref 1.5–4.5)
Glucose: 82 mg/dL (ref 65–99)
Potassium: 5.3 mmol/L — ABNORMAL HIGH (ref 3.5–5.2)
SODIUM: 134 mmol/L (ref 134–144)
TOTAL PROTEIN: 6.7 g/dL (ref 6.0–8.5)

## 2014-09-26 LAB — CBC WITH DIFFERENTIAL/PLATELET
Basophils Absolute: 0.1 10*3/uL (ref 0.0–0.2)
Basos: 1 %
Eos: 7 %
Eosinophils Absolute: 0.5 10*3/uL — ABNORMAL HIGH (ref 0.0–0.4)
HCT: 44.7 % (ref 37.5–51.0)
Hemoglobin: 14.8 g/dL (ref 12.6–17.7)
Immature Grans (Abs): 0.2 10*3/uL — ABNORMAL HIGH (ref 0.0–0.1)
Immature Granulocytes: 2 %
LYMPHS: 25 %
Lymphocytes Absolute: 1.9 10*3/uL (ref 0.7–3.1)
MCH: 32.3 pg (ref 26.6–33.0)
MCHC: 33.1 g/dL (ref 31.5–35.7)
MCV: 98 fL — AB (ref 79–97)
MONOCYTES: 9 %
MONOS ABS: 0.7 10*3/uL (ref 0.1–0.9)
NEUTROS PCT: 56 %
Neutrophils Absolute: 4.3 10*3/uL (ref 1.4–7.0)
PLATELETS: 223 10*3/uL (ref 150–379)
RBC: 4.58 x10E6/uL (ref 4.14–5.80)
RDW: 13.5 % (ref 12.3–15.4)
WBC: 7.5 10*3/uL (ref 3.4–10.8)

## 2014-09-28 ENCOUNTER — Ambulatory Visit: Payer: Medicare HMO | Admitting: Neurology

## 2014-10-05 ENCOUNTER — Telehealth: Payer: Self-pay

## 2014-10-05 NOTE — Telephone Encounter (Signed)
Talked with the mother. She is aware of the results and states that she understands.

## 2014-10-05 NOTE — Progress Notes (Signed)
Quick Note:  Please call patient's mother regarding his recent blood work. Cell count and electrolytes and liver function and kidney functions look fine with the exception of borderline potassium at 5.3. Cut off with normal potassium is 5.2. He may need to get it rechecked with his primary care provider in a month or 2. Please advise patient's mother to make sure he sees his primary care provider in one or 2 months.  Huston FoleySaima Azaryah Heathcock, MD, PhD [facility] ([facility])  ______

## 2014-10-05 NOTE — Telephone Encounter (Signed)
-----   Message from Huston FoleySaima Athar, MD sent at 10/05/2014  3:13 PM EDT ----- Please call patient's mother regarding his recent blood work. Cell count and electrolytes and liver function and kidney functions look fine with the exception of borderline potassium at 5.3. Cut off with normal potassium is 5.2. He may need to get it rechecked with his primary care provider in a month or 2. Please advise patient's mother to make sure he sees his primary care provider in one or 2 months.  Huston FoleySaima Athar, MD, PhD [facility])

## 2014-11-04 ENCOUNTER — Other Ambulatory Visit: Payer: Self-pay | Admitting: Neurology

## 2014-11-04 DIAGNOSIS — G231 Progressive supranuclear ophthalmoplegia [Steele-Richardson-Olszewski]: Secondary | ICD-10-CM | POA: Insufficient documentation

## 2014-11-04 DIAGNOSIS — E7800 Pure hypercholesterolemia, unspecified: Secondary | ICD-10-CM | POA: Insufficient documentation

## 2014-11-05 NOTE — Telephone Encounter (Signed)
Last OV note says: There are 2 prescriptions for Effexor XR as well. I prescribed a 37.5 mg strength

## 2014-11-19 DIAGNOSIS — L89159 Pressure ulcer of sacral region, unspecified stage: Secondary | ICD-10-CM | POA: Insufficient documentation

## 2014-11-19 DIAGNOSIS — L210 Seborrhea capitis: Secondary | ICD-10-CM | POA: Insufficient documentation

## 2014-12-08 ENCOUNTER — Encounter (HOSPITAL_BASED_OUTPATIENT_CLINIC_OR_DEPARTMENT_OTHER): Payer: Medicare Other | Attending: General Surgery

## 2014-12-28 ENCOUNTER — Other Ambulatory Visit: Payer: Self-pay

## 2014-12-29 ENCOUNTER — Emergency Department (HOSPITAL_COMMUNITY): Payer: Medicare HMO

## 2014-12-29 ENCOUNTER — Encounter (HOSPITAL_COMMUNITY): Payer: Self-pay | Admitting: Cardiology

## 2014-12-29 ENCOUNTER — Emergency Department (HOSPITAL_COMMUNITY)
Admission: EM | Admit: 2014-12-29 | Discharge: 2014-12-30 | Disposition: A | Discharge status: Home / Self Care | Payer: Medicare HMO | Attending: Emergency Medicine | Admitting: Emergency Medicine

## 2014-12-29 DIAGNOSIS — Z87891 Personal history of nicotine dependence: Secondary | ICD-10-CM | POA: Insufficient documentation

## 2014-12-29 DIAGNOSIS — E119 Type 2 diabetes mellitus without complications: Secondary | ICD-10-CM | POA: Insufficient documentation

## 2014-12-29 DIAGNOSIS — Z79899 Other long term (current) drug therapy: Secondary | ICD-10-CM | POA: Insufficient documentation

## 2014-12-29 DIAGNOSIS — G629 Polyneuropathy, unspecified: Secondary | ICD-10-CM | POA: Insufficient documentation

## 2014-12-29 DIAGNOSIS — G40909 Epilepsy, unspecified, not intractable, without status epilepticus: Secondary | ICD-10-CM | POA: Insufficient documentation

## 2014-12-29 DIAGNOSIS — M1990 Unspecified osteoarthritis, unspecified site: Secondary | ICD-10-CM | POA: Insufficient documentation

## 2014-12-29 DIAGNOSIS — M109 Gout, unspecified: Secondary | ICD-10-CM | POA: Diagnosis not present

## 2014-12-29 DIAGNOSIS — Z8701 Personal history of pneumonia (recurrent): Secondary | ICD-10-CM | POA: Insufficient documentation

## 2014-12-29 DIAGNOSIS — R200 Anesthesia of skin: Secondary | ICD-10-CM | POA: Diagnosis present

## 2014-12-29 DIAGNOSIS — Z8673 Personal history of transient ischemic attack (TIA), and cerebral infarction without residual deficits: Secondary | ICD-10-CM | POA: Insufficient documentation

## 2014-12-29 DIAGNOSIS — Z880 Allergy status to penicillin: Secondary | ICD-10-CM | POA: Diagnosis not present

## 2014-12-29 DIAGNOSIS — Z9889 Other specified postprocedural states: Secondary | ICD-10-CM | POA: Diagnosis not present

## 2014-12-29 DIAGNOSIS — R29810 Facial weakness: Secondary | ICD-10-CM | POA: Insufficient documentation

## 2014-12-29 DIAGNOSIS — F329 Major depressive disorder, single episode, unspecified: Secondary | ICD-10-CM | POA: Diagnosis not present

## 2014-12-29 DIAGNOSIS — M25512 Pain in left shoulder: Secondary | ICD-10-CM | POA: Diagnosis not present

## 2014-12-29 DIAGNOSIS — Z7951 Long term (current) use of inhaled steroids: Secondary | ICD-10-CM | POA: Insufficient documentation

## 2014-12-29 DIAGNOSIS — Z8781 Personal history of (healed) traumatic fracture: Secondary | ICD-10-CM | POA: Insufficient documentation

## 2014-12-29 DIAGNOSIS — I10 Essential (primary) hypertension: Secondary | ICD-10-CM | POA: Insufficient documentation

## 2014-12-29 LAB — BASIC METABOLIC PANEL
ANION GAP: 10 (ref 5–15)
BUN: 10 mg/dL (ref 6–20)
CALCIUM: 8.9 mg/dL (ref 8.9–10.3)
CO2: 23 mmol/L (ref 22–32)
Chloride: 104 mmol/L (ref 101–111)
Creatinine, Ser: 0.48 mg/dL — ABNORMAL LOW (ref 0.61–1.24)
GFR calc Af Amer: >60 mL/min (ref >60)
GFR calc non Af Amer: >60 mL/min (ref >60)
Glucose, Bld: 88 mg/dL (ref 65–99)
Potassium: 3.7 mmol/L (ref 3.5–5.1)
SODIUM: 137 mmol/L (ref 135–145)

## 2014-12-29 LAB — CBC WITH DIFFERENTIAL/PLATELET
BASOS ABS: 0 10*3/uL (ref 0.0–0.1)
Basophils Relative: 1 % (ref 0–1)
EOS ABS: 0.5 10*3/uL (ref 0.0–0.7)
EOS PCT: 9 % — AB (ref 0–5)
HCT: 42.2 % (ref 39.0–52.0)
Hemoglobin: 14.4 g/dL (ref 13.0–17.0)
Lymphocytes Relative: 36 % (ref 12–46)
Lymphs Abs: 2.2 10*3/uL (ref 0.7–4.0)
MCH: 33 pg (ref 26.0–34.0)
MCHC: 34.1 g/dL (ref 30.0–36.0)
MCV: 96.8 fL (ref 78.0–100.0)
Monocytes Absolute: 0.5 10*3/uL (ref 0.1–1.0)
Monocytes Relative: 8 % (ref 3–12)
Neutro Abs: 2.7 10*3/uL (ref 1.7–7.7)
Neutrophils Relative %: 46 % (ref 43–77)
Platelets: 170 10*3/uL (ref 150–400)
RBC: 4.36 MIL/uL (ref 4.22–5.81)
RDW: 12.4 % (ref 11.5–15.5)
WBC: 5.9 10*3/uL (ref 4.0–10.5)

## 2014-12-29 MED ORDER — HYDROCODONE-ACETAMINOPHEN 5-325 MG PO TABS
2.0000 | ORAL_TABLET | Freq: Once | ORAL | Status: AC
  Administered 2014-12-29: 2 via ORAL
  Filled 2014-12-29: qty 2

## 2014-12-29 MED ORDER — HYDROCODONE-ACETAMINOPHEN 5-325 MG PO TABS: 1.0000 | ORAL_TABLET | Freq: Once | ORAL | Status: DC

## 2014-12-29 NOTE — Discharge Instructions (Signed)

## 2014-12-29 NOTE — ED Notes (Signed)
Patient transported to MRI 
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
## 2014-12-29 NOTE — ED Notes (Signed)
Pt at MRI

## 2014-12-29 NOTE — ED Notes (Signed)
Pt to department via EMS from home- pt reports left shoulder pain that started today. Pain with movement, denies any falls. Pt reports he was sleeping and woke up with the pain. Bp-110/52 Hr-91 Cbg-124 Pt at baseline per family.

## 2014-12-30 DIAGNOSIS — R200 Anesthesia of skin: Secondary | ICD-10-CM | POA: Diagnosis not present

## 2014-12-30 NOTE — ED Notes (Signed)
Pt stable, denies any pain (thumbs down), Spoke with wife on the phone, [facility] called per her request, Pt gestures (thumbs up) understanding of discharge instructions

## 2015-01-12 ENCOUNTER — Non-Acute Institutional Stay (SKILLED_NURSING_FACILITY): Payer: Medicare HMO | Admitting: Internal Medicine

## 2015-01-12 DIAGNOSIS — G231 Progressive supranuclear ophthalmoplegia [Steele-Richardson-Olszewski]: Secondary | ICD-10-CM | POA: Diagnosis not present

## 2015-01-12 DIAGNOSIS — Z931 Gastrostomy status: Secondary | ICD-10-CM | POA: Diagnosis not present

## 2015-01-12 DIAGNOSIS — G40909 Epilepsy, unspecified, not intractable, without status epilepticus: Secondary | ICD-10-CM | POA: Diagnosis not present

## 2015-01-12 DIAGNOSIS — M4712 Other spondylosis with myelopathy, cervical region: Secondary | ICD-10-CM

## 2015-01-13 ENCOUNTER — Other Ambulatory Visit: Payer: Self-pay | Admitting: *Deleted

## 2015-01-13 MED ORDER — HYDROCODONE-ACETAMINOPHEN 10-325 MG PO TABS: ORAL_TABLET | ORAL | Status: DC

## 2015-01-13 NOTE — Telephone Encounter (Signed)
[name] 
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
## 2015-01-20 ENCOUNTER — Emergency Department (HOSPITAL_COMMUNITY): Payer: Medicare HMO

## 2015-01-20 ENCOUNTER — Emergency Department (HOSPITAL_COMMUNITY)
Admission: EM | Admit: 2015-01-20 | Discharge: 2015-01-20 | Disposition: A | Discharge status: Home / Self Care | Payer: Medicare HMO | Attending: Emergency Medicine | Admitting: Emergency Medicine

## 2015-01-20 ENCOUNTER — Encounter (HOSPITAL_COMMUNITY): Payer: Self-pay | Admitting: Emergency Medicine

## 2015-01-20 ENCOUNTER — Encounter (HOSPITAL_COMMUNITY): Payer: Self-pay | Admitting: *Deleted

## 2015-01-20 DIAGNOSIS — W19XXXA Unspecified fall, initial encounter: Secondary | ICD-10-CM

## 2015-01-20 DIAGNOSIS — E119 Type 2 diabetes mellitus without complications: Secondary | ICD-10-CM | POA: Insufficient documentation

## 2015-01-20 DIAGNOSIS — E780 Pure hypercholesterolemia: Secondary | ICD-10-CM | POA: Insufficient documentation

## 2015-01-20 DIAGNOSIS — G629 Polyneuropathy, unspecified: Secondary | ICD-10-CM | POA: Insufficient documentation

## 2015-01-20 DIAGNOSIS — Z7952 Long term (current) use of systemic steroids: Secondary | ICD-10-CM | POA: Diagnosis not present

## 2015-01-20 DIAGNOSIS — Y929 Unspecified place or not applicable: Secondary | ICD-10-CM | POA: Insufficient documentation

## 2015-01-20 DIAGNOSIS — F329 Major depressive disorder, single episode, unspecified: Secondary | ICD-10-CM | POA: Insufficient documentation

## 2015-01-20 DIAGNOSIS — Z87820 Personal history of traumatic brain injury: Secondary | ICD-10-CM | POA: Insufficient documentation

## 2015-01-20 DIAGNOSIS — S7002XA Contusion of left hip, initial encounter: Secondary | ICD-10-CM | POA: Insufficient documentation

## 2015-01-20 DIAGNOSIS — Y9389 Activity, other specified: Secondary | ICD-10-CM | POA: Diagnosis not present

## 2015-01-20 DIAGNOSIS — Y999 Unspecified external cause status: Secondary | ICD-10-CM | POA: Insufficient documentation

## 2015-01-20 DIAGNOSIS — M1990 Unspecified osteoarthritis, unspecified site: Secondary | ICD-10-CM | POA: Diagnosis not present

## 2015-01-20 DIAGNOSIS — W1839XA Other fall on same level, initial encounter: Secondary | ICD-10-CM | POA: Diagnosis not present

## 2015-01-20 DIAGNOSIS — M109 Gout, unspecified: Secondary | ICD-10-CM | POA: Insufficient documentation

## 2015-01-20 DIAGNOSIS — G40909 Epilepsy, unspecified, not intractable, without status epilepticus: Secondary | ICD-10-CM | POA: Diagnosis not present

## 2015-01-20 DIAGNOSIS — Z880 Allergy status to penicillin: Secondary | ICD-10-CM | POA: Insufficient documentation

## 2015-01-20 DIAGNOSIS — S79912A Unspecified injury of left hip, initial encounter: Secondary | ICD-10-CM | POA: Diagnosis present

## 2015-01-20 DIAGNOSIS — I10 Essential (primary) hypertension: Secondary | ICD-10-CM | POA: Insufficient documentation

## 2015-01-20 DIAGNOSIS — Z792 Long term (current) use of antibiotics: Secondary | ICD-10-CM | POA: Diagnosis not present

## 2015-01-20 DIAGNOSIS — Z79899 Other long term (current) drug therapy: Secondary | ICD-10-CM | POA: Insufficient documentation

## 2015-01-20 DIAGNOSIS — Y998 Other external cause status: Secondary | ICD-10-CM | POA: Insufficient documentation

## 2015-01-20 DIAGNOSIS — Y92121 Bathroom in nursing home as the place of occurrence of the external cause: Secondary | ICD-10-CM | POA: Insufficient documentation

## 2015-01-20 DIAGNOSIS — Z8673 Personal history of transient ischemic attack (TIA), and cerebral infarction without residual deficits: Secondary | ICD-10-CM | POA: Insufficient documentation

## 2015-01-20 DIAGNOSIS — W1811XA Fall from or off toilet without subsequent striking against object, initial encounter: Secondary | ICD-10-CM | POA: Diagnosis not present

## 2015-01-20 DIAGNOSIS — T148XXA Other injury of unspecified body region, initial encounter: Secondary | ICD-10-CM

## 2015-01-20 DIAGNOSIS — Z791 Long term (current) use of non-steroidal anti-inflammatories (NSAID): Secondary | ICD-10-CM | POA: Diagnosis not present

## 2015-01-20 DIAGNOSIS — Z8701 Personal history of pneumonia (recurrent): Secondary | ICD-10-CM | POA: Diagnosis not present

## 2015-01-20 DIAGNOSIS — Y939 Activity, unspecified: Secondary | ICD-10-CM | POA: Insufficient documentation

## 2015-01-20 DIAGNOSIS — Z87891 Personal history of nicotine dependence: Secondary | ICD-10-CM | POA: Diagnosis not present

## 2015-01-20 DIAGNOSIS — Z8781 Personal history of (healed) traumatic fracture: Secondary | ICD-10-CM | POA: Diagnosis not present

## 2015-01-20 DIAGNOSIS — Z7951 Long term (current) use of inhaled steroids: Secondary | ICD-10-CM | POA: Insufficient documentation

## 2015-01-20 MED ORDER — HYDROCODONE-ACETAMINOPHEN 5-325 MG PO TABS
1.0000 | ORAL_TABLET | Freq: Once | ORAL | Status: AC
  Administered 2015-01-20: 1 via ORAL
  Filled 2015-01-20: qty 1

## 2015-01-20 MED ORDER — HYDROCODONE-ACETAMINOPHEN 5-325 MG PO TABS: ORAL_TABLET | ORAL | Status: DC

## 2015-01-20 MED ORDER — HYDROCODONE-ACETAMINOPHEN 5-325 MG PO TABS
1.0000 | ORAL_TABLET | Freq: Once | ORAL | Status: AC
Start: 2015-01-20 — End: 2015-01-20
  Administered 2015-01-20: 1 via ORAL
  Filled 2015-01-20: qty 1

## 2015-01-20 NOTE — ED Notes (Signed)
Per ems pt from [facility]. Pt had fall yesterday, unwitnessed. Pt reports that he was in toilet when he lost his balance and fell. Pt denies head injury or loc. rpts right hip pain pst fall. Per ems no deformity to extremity or shortening nor rotation. , yet present bruising to right hip. No anticoagulant intake. Pt is alert and oriented x 4. Pt is non verbal .

## 2015-01-20 NOTE — ED Notes (Signed)
[facility] Expected date:  Expected time:  Means of arrival:  Comments: [facility]

## 2015-01-20 NOTE — ED Notes (Signed)
Patient discharged earlier this evening is back for CT of hip.

## 2015-01-20 NOTE — ED Notes (Signed)
Bed: [facility]06 Expected date:  Expected time:  Means of arrival:  Comments: ems 

## 2015-01-20 NOTE — ED Notes (Signed)
[facility] called for transport.  
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
## 2015-01-20 NOTE — Discharge Instructions (Signed)
Please follow with your primary care doctor in the next 2 days for a check-up. They must obtain records for further management.   Do not hesitate to return to the Emergency Department for any new, worsening or concerning symptoms.   Take vicodin for breakthrough pain, do not drink alcohol, drive, care for children or do other critical tasks while taking vicodin.   Contusion A contusion is a deep bruise. Contusions are the result of an injury that caused bleeding under the skin. The contusion may turn blue, purple, or yellow. Minor injuries will give you a painless contusion, but more severe contusions may stay painful and swollen for a few weeks.  CAUSES  A contusion is usually caused by a blow, trauma, or direct force to an area of the body. SYMPTOMS   Swelling and redness of the injured area.  Bruising of the injured area.  Tenderness and soreness of the injured area.  Pain. DIAGNOSIS  The diagnosis can be made by taking a history and physical exam. An X-ray, CT scan, or MRI may be needed to determine if there were any associated injuries, such as fractures. TREATMENT  Specific treatment will depend on what area of the body was injured. In general, the best treatment for a contusion is resting, icing, elevating, and applying cold compresses to the injured area. Over-the-counter medicines may also be recommended for pain control. Ask your caregiver what the best treatment is for your contusion. HOME CARE INSTRUCTIONS   Put ice on the injured area.  Put ice in a plastic bag.  Place a towel between your skin and the bag.  Leave the ice on for 15-20 minutes, 3-4 times a day, or as directed by your health care provider.  Only take over-the-counter or prescription medicines for pain, discomfort, or fever as directed by your caregiver. Your caregiver may recommend avoiding anti-inflammatory medicines (aspirin, ibuprofen, and naproxen) for 48 hours because these medicines may increase  bruising.  Rest the injured area.  If possible, elevate the injured area to reduce swelling. SEEK IMMEDIATE MEDICAL CARE IF:   You have increased bruising or swelling.  You have pain that is getting worse.  Your swelling or pain is not relieved with medicines. MAKE SURE YOU:   Understand these instructions.  Will watch your condition.  Will get help right away if you are not doing well or get worse. Document Released: 03/29/2005 Document Revised: 06/24/2013 Document Reviewed: 04/24/2011 [facility] Patient Information 2015 [place], [place]. This information is not intended to replace advice given to you by your health care provider. Make sure you discuss any questions you have with your health care provider.

## 2015-01-20 NOTE — ED Notes (Signed)
Pt is normally wheelchair bound, however does have ROM of hip.
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
## 2015-01-20 NOTE — Discharge Instructions (Signed)
Take vicodin for breakthrough pain, do not drink alcohol, drive, care for children or do other critical tasks while taking vicodin.  Please follow with your primary care doctor in the next 2 days for a check-up. They must obtain records for further management.   Do not hesitate to return to the Emergency Department for any new, worsening or concerning symptoms.    Contusion A contusion is a deep bruise. Contusions happen when an injury causes bleeding under the skin. Signs of bruising include pain, puffiness (swelling), and discolored skin. The contusion may turn blue, purple, or yellow. HOME CARE   Put ice on the injured area.  Put ice in a plastic bag.  Place a towel between your skin and the bag.  Leave the ice on for 15-20 minutes, 03-04 times a day.  Only take medicine as told by your doctor.  Rest the injured area.  If possible, raise (elevate) the injured area to lessen puffiness. GET HELP RIGHT AWAY IF:   You have more bruising or puffiness.  You have pain that is getting worse.  Your puffiness or pain is not helped by medicine. MAKE SURE YOU:   Understand these instructions.  Will watch your condition.  Will get help right away if you are not doing well or get worse. Document Released: 12/06/2007 Document Revised: 09/11/2011 Document Reviewed: 04/24/2011 [facility] Patient Information 2015 [place], [place]. This information is not intended to replace advice given to you by your health care provider. Make sure you discuss any questions you have with your health care provider.

## 2015-01-22 ENCOUNTER — Other Ambulatory Visit: Payer: Self-pay | Admitting: *Deleted

## 2015-01-22 MED ORDER — HYDROCODONE-ACETAMINOPHEN 5-325 MG PO TABS: ORAL_TABLET | ORAL | Status: DC

## 2015-01-22 NOTE — Telephone Encounter (Signed)
[name] 

## 2015-01-29 ENCOUNTER — Other Ambulatory Visit: Payer: Self-pay | Admitting: *Deleted

## 2015-01-29 MED ORDER — HYDROCODONE-ACETAMINOPHEN 5-325 MG PO TABS: ORAL_TABLET | ORAL | Status: DC

## 2015-01-29 NOTE — Telephone Encounter (Signed)
[name] 

## 2015-03-04 ENCOUNTER — Emergency Department (HOSPITAL_COMMUNITY): Payer: Medicare HMO

## 2015-03-04 ENCOUNTER — Emergency Department (HOSPITAL_COMMUNITY)
Admission: EM | Admit: 2015-03-04 | Discharge: 2015-03-05 | Disposition: A | Discharge status: Home / Self Care | Payer: Medicare HMO | Attending: Emergency Medicine | Admitting: Emergency Medicine

## 2015-03-04 ENCOUNTER — Encounter (HOSPITAL_COMMUNITY): Payer: Self-pay | Admitting: Emergency Medicine

## 2015-03-04 DIAGNOSIS — R0602 Shortness of breath: Secondary | ICD-10-CM | POA: Insufficient documentation

## 2015-03-04 DIAGNOSIS — Z880 Allergy status to penicillin: Secondary | ICD-10-CM | POA: Diagnosis not present

## 2015-03-04 DIAGNOSIS — Z79899 Other long term (current) drug therapy: Secondary | ICD-10-CM | POA: Diagnosis not present

## 2015-03-04 DIAGNOSIS — Z87820 Personal history of traumatic brain injury: Secondary | ICD-10-CM | POA: Diagnosis not present

## 2015-03-04 DIAGNOSIS — Z87891 Personal history of nicotine dependence: Secondary | ICD-10-CM | POA: Diagnosis not present

## 2015-03-04 DIAGNOSIS — I10 Essential (primary) hypertension: Secondary | ICD-10-CM | POA: Insufficient documentation

## 2015-03-04 DIAGNOSIS — Z8701 Personal history of pneumonia (recurrent): Secondary | ICD-10-CM | POA: Insufficient documentation

## 2015-03-04 DIAGNOSIS — R200 Anesthesia of skin: Secondary | ICD-10-CM | POA: Diagnosis present

## 2015-03-04 DIAGNOSIS — Z9889 Other specified postprocedural states: Secondary | ICD-10-CM | POA: Diagnosis not present

## 2015-03-04 DIAGNOSIS — M1990 Unspecified osteoarthritis, unspecified site: Secondary | ICD-10-CM | POA: Diagnosis not present

## 2015-03-04 DIAGNOSIS — R51 Headache: Secondary | ICD-10-CM | POA: Insufficient documentation

## 2015-03-04 DIAGNOSIS — G629 Polyneuropathy, unspecified: Secondary | ICD-10-CM | POA: Insufficient documentation

## 2015-03-04 DIAGNOSIS — F329 Major depressive disorder, single episode, unspecified: Secondary | ICD-10-CM | POA: Insufficient documentation

## 2015-03-04 DIAGNOSIS — R531 Weakness: Secondary | ICD-10-CM | POA: Diagnosis not present

## 2015-03-04 DIAGNOSIS — G40909 Epilepsy, unspecified, not intractable, without status epilepticus: Secondary | ICD-10-CM | POA: Diagnosis not present

## 2015-03-04 DIAGNOSIS — E119 Type 2 diabetes mellitus without complications: Secondary | ICD-10-CM | POA: Insufficient documentation

## 2015-03-04 DIAGNOSIS — Z8781 Personal history of (healed) traumatic fracture: Secondary | ICD-10-CM | POA: Insufficient documentation

## 2015-03-04 DIAGNOSIS — M5412 Radiculopathy, cervical region: Secondary | ICD-10-CM | POA: Diagnosis not present

## 2015-03-04 DIAGNOSIS — Z7951 Long term (current) use of inhaled steroids: Secondary | ICD-10-CM | POA: Diagnosis not present

## 2015-03-04 DIAGNOSIS — M109 Gout, unspecified: Secondary | ICD-10-CM | POA: Diagnosis not present

## 2015-03-04 LAB — DIFFERENTIAL
BASOS PCT: 1 % (ref 0–1)
Basophils Absolute: 0 10*3/uL (ref 0.0–0.1)
Eosinophils Absolute: 0.3 10*3/uL (ref 0.0–0.7)
Eosinophils Relative: 6 % — ABNORMAL HIGH (ref 0–5)
Lymphocytes Relative: 36 % (ref 12–46)
Lymphs Abs: 2.1 10*3/uL (ref 0.7–4.0)
MONO ABS: 0.4 10*3/uL (ref 0.1–1.0)
Monocytes Relative: 7 % (ref 3–12)
NEUTROS PCT: 50 % (ref 43–77)
Neutro Abs: 2.9 10*3/uL (ref 1.7–7.7)

## 2015-03-04 LAB — CBC
HCT: 45.5 % (ref 39.0–52.0)
Hemoglobin: 14.5 g/dL (ref 13.0–17.0)
MCH: 31.9 pg (ref 26.0–34.0)
MCHC: 31.9 g/dL (ref 30.0–36.0)
MCV: 100 fL (ref 78.0–100.0)
PLATELETS: 143 10*3/uL — AB (ref 150–400)
RBC: 4.55 MIL/uL (ref 4.22–5.81)
RDW: 12.3 % (ref 11.5–15.5)
WBC: 5.7 10*3/uL (ref 4.0–10.5)

## 2015-03-04 LAB — PROTIME-INR
INR: 1.19 (ref 0.00–1.49)
PROTHROMBIN TIME: 15.3 s — AB (ref 11.6–15.2)

## 2015-03-04 LAB — COMPREHENSIVE METABOLIC PANEL
ALBUMIN: 4 g/dL (ref 3.5–5.0)
ALT: 28 U/L (ref 17–63)
ANION GAP: 8 (ref 5–15)
AST: 24 U/L (ref 15–41)
Alkaline Phosphatase: 81 U/L (ref 38–126)
BUN: 16 mg/dL (ref 6–20)
CHLORIDE: 106 mmol/L (ref 101–111)
CO2: 27 mmol/L (ref 22–32)
Calcium: 9.1 mg/dL (ref 8.9–10.3)
Creatinine, Ser: 0.71 mg/dL (ref 0.61–1.24)
GFR calc non Af Amer: >60 mL/min (ref >60)
GLUCOSE: 87 mg/dL (ref 65–99)
Potassium: 3.6 mmol/L (ref 3.5–5.1)
Sodium: 141 mmol/L (ref 135–145)
Total Bilirubin: 0.9 mg/dL (ref 0.3–1.2)
Total Protein: 6.6 g/dL (ref 6.5–8.1)

## 2015-03-04 LAB — I-STAT TROPONIN, ED: Troponin i, poc: 0 ng/mL (ref 0.00–0.08)

## 2015-03-04 LAB — APTT: APTT: 33 s (ref 24–37)

## 2015-03-04 LAB — CBG MONITORING, ED: GLUCOSE-CAPILLARY: 72 mg/dL (ref 65–99)

## 2015-03-04 MED ORDER — HYDROMORPHONE HCL 1 MG/ML IJ SOLN
0.5000 mg | Freq: Once | INTRAMUSCULAR | Status: AC
  Administered 2015-03-04: 0.5 mg via INTRAVENOUS
  Filled 2015-03-04: qty 1

## 2015-03-04 MED ORDER — ONDANSETRON HCL 4 MG/2ML IJ SOLN
4.0000 mg | Freq: Once | INTRAMUSCULAR | Status: AC
  Administered 2015-03-04: 4 mg via INTRAVENOUS
  Filled 2015-03-04: qty 2

## 2015-03-04 MED ORDER — MORPHINE SULFATE (PF) 4 MG/ML IV SOLN
4.0000 mg | Freq: Once | INTRAVENOUS | Status: AC
  Administered 2015-03-04: 4 mg via INTRAVENOUS
  Filled 2015-03-04: qty 1

## 2015-03-04 MED ORDER — METHYLPREDNISOLONE 4 MG PO TBPK: ORAL_TABLET | ORAL | Status: DC

## 2015-03-04 NOTE — Discharge Instructions (Signed)
Needs to receive his hydrocodone every 4 hours as needed for pain.  Needs to continue to get his flexeril 3 times a day.  Dr. Leanord Hawking needs to see him tomorrow to ensure adequate pain control. Cervical Radiculopathy Cervical radiculopathy happens when a nerve in the neck is pinched or bruised by a slipped (herniated) disk or by arthritic changes in the bones of the cervical spine. This can occur due to an injury or as part of the normal aging process. Pressure on the cervical nerves can cause pain or numbness that runs from your neck all the way down into your arm and fingers. CAUSES  There are many possible causes, including:  Injury.  Muscle tightness in the neck from overuse.  Swollen, painful joints (arthritis).  Breakdown or degeneration in the bones and joints of the spine (spondylosis) due to aging.  Bone spurs that may develop near the cervical nerves. SYMPTOMS  Symptoms include pain, weakness, or numbness in the affected arm and hand. Pain can be severe or irritating. Symptoms may be worse when extending or turning the neck. DIAGNOSIS  Your caregiver will ask about your symptoms and do a physical exam. He or she may test your strength and reflexes. X-rays, CT scans, and MRI scans may be needed in cases of injury or if the symptoms do not go away after a period of time. Electromyography (EMG) or nerve conduction testing may be done to study how your nerves and muscles are working. TREATMENT  Your caregiver may recommend certain exercises to help relieve your symptoms. Cervical radiculopathy can, and often does, get better with time and treatment. If your problems continue, treatment options may include:  Wearing a soft collar for short periods of time.  Physical therapy to strengthen the neck muscles.  Medicines, such as nonsteroidal anti-inflammatory drugs (NSAIDs), oral corticosteroids, or spinal injections.  Surgery. Different types of surgery may be done depending on the cause  of your problems. HOME CARE INSTRUCTIONS   Put ice on the affected area.  Put ice in a plastic bag.  Place a towel between your skin and the bag.  Leave the ice on for 15-20 minutes, 03-04 times a day or as directed by your caregiver.  If ice does not help, you can try using heat. Take a warm shower or bath, or use a hot water bottle as directed by your caregiver.  You may try a gentle neck and shoulder massage.  Use a flat pillow when you sleep.  Only take over-the-counter or prescription medicines for pain, discomfort, or fever as directed by your caregiver.  If physical therapy was prescribed, follow your caregiver's directions.  If a soft collar was prescribed, use it as directed. SEEK IMMEDIATE MEDICAL CARE IF:   Your pain gets much worse and cannot be controlled with medicines.  You have weakness or numbness in your hand, arm, face, or leg.  You have a high fever or a stiff, rigid neck.  You lose bowel or bladder control (incontinence).  You have trouble with walking, balance, or speaking. MAKE SURE YOU:   Understand these instructions.  Will watch your condition.  Will get help right away if you are not doing well or get worse. Document Released: 03/14/2001 Document Revised: 09/11/2011 Document Reviewed: 01/31/2011 [facility] Patient Information 2015 [place], [place]. This information is not intended to replace advice given to you by your health care provider. Make sure you discuss any questions you have with your health care provider.

## 2015-03-04 NOTE — ED Notes (Signed)
Dr. Plunkett MD at bedside. 

## 2015-03-04 NOTE — ED Notes (Signed)
Patient transported to CT 

## 2015-03-04 NOTE — ED Notes (Signed)
MD at bedside. 
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
## 2015-03-04 NOTE — ED Notes (Addendum)
Pt arrives via [facility] from [place] c/o L sided weakness and numbness beginning yesterday around 4pm.  Pt reports numbness and pain from L temporal region down side to L wrist. Pt is nonverbal at baseline, AOx4, communicates with thumbs up/down and through writing.   EMS reports no LOC, N/V.  Pt reports some SOB.  LUE weakness noted.

## 2015-03-16 ENCOUNTER — Non-Acute Institutional Stay: Payer: Medicare HMO | Admitting: Internal Medicine

## 2015-03-16 DIAGNOSIS — M792 Neuralgia and neuritis, unspecified: Secondary | ICD-10-CM | POA: Diagnosis not present

## 2015-03-16 DIAGNOSIS — G231 Progressive supranuclear ophthalmoplegia [Steele-Richardson-Olszewski]: Secondary | ICD-10-CM | POA: Diagnosis not present

## 2015-03-16 DIAGNOSIS — M4802 Spinal stenosis, cervical region: Secondary | ICD-10-CM | POA: Diagnosis not present

## 2015-03-18 ENCOUNTER — Telehealth: Payer: Self-pay

## 2015-03-18 NOTE — Telephone Encounter (Signed)
I tried several times to call patient and move appt to slot, but number for patient is not a working number.

## 2015-03-22 NOTE — Progress Notes (Addendum)
Patient ID: Alexander [name], male   DOB: 1956/04/06, 59 y.o.   MRN: [id number]                PROGRESS NOTE  DATE:  03/16/2015                  FACILITY: [facility]                 LEVEL OF CARE:   SNF   Acute Visit                CHIEF COMPLAINT:  Left arm pain and increasing weakness.       HISTORY OF PRESENT ILLNESS:   Alexander [name] is a gentleman whom I have seen one time previously, admitting him to the nursing home in mid July 2016, coming from home.    He has two major issues.  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  Nevertheless, once he is up in the wheelchair, he seems able to propel himself around the facility.     He also has had a history of a C1 fracture and has had two surgeries on his C-spine.  The original fracture was in 2012.  His last MRI of the C-spine was in June 2016.  There was not felt to be any canal stenosis.   He has moderate C3-C4, C5-C6 height loss suspected.  Demineralization in C5-C6.  Cervical spinal canal was widely patent.  No myelomalacia.  Previous MRI in 2013 noted chronic broad-based disk protrusion at C6-C7 with mild, if any, spinal cord mass effect.  Severe facet hypertrophy.  Moderate to severe bilateral C7 foraminal stenosis.  There was also foraminal stenosis noted at C4-C5.    The patient has a PEG tube for nutrition and medications.     He speaks very softly and some words are understandable.   However, he can easily make himself understood by writing and/or thumbs up or thumbs down type of responses.    I note he was over in the ER after falling, complaining of pain.  They sent him back here with a diagnosis of cervical radiculopathy.       LABORATORY DATA:   Lab work from 03/04/2015:      Comprehensive metabolic panel:  Essentially normal.    CBC with diff:  Normal other than a mild elevation of his MCV at 100.  Hemoglobin 14.5, white count normal, differential count normal.    REVIEW OF  SYSTEMS:   This is really hard to go through.     MUSCULOSKELETAL:  He is mostly complaining of pain in his neck, radiating to his left upper arm, but also increasing weakness in the arm and I think dysesthesias in his hand.            PHYSICAL EXAMINATION:   GENERAL APPEARANCE:  The patient does not look to be in any distress.   CHEST/RESPIRATORY:  Clear air entry bilaterally.    CARDIOVASCULAR:   CARDIAC:  Heart sounds are normal.  He is not dehydrated.      GASTROINTESTINAL:   ABDOMEN:  PEG site looks fine.   LIVER/SPLEEN/KIDNEYS:  No liver, no spleen.   NEUROLOGICAL:   Mostly limited to the left arm.   CRANIAL NERVES:  No conjugate gaze in any direction  MOTOR:  He has lower motor neuron changes in the arm with a claw hand deformity.   SENSATION/STRENGTH:  He has wasting of the interossei, also the thenar  eminence.    He can grip my fingers and extend his fingers.  He has some weak flexion at the elbow, perhaps 3+/5.  Very little extension.   He has no shoulder flexion or abduction.  There is muscle wasting , but no fasciculations are noted.   DEEP TENDON REFLEXES:  He has absent reflexes in the arm.  I cannot pick up any reflexes in the left arm.  He has 1+ reflexes at the knee jerk and on the right arm, as well.  There is no Hoffman's reflex.    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  I think this probably would easily explain the lower motor neuron findings we are seeing in his arm.  He is really disquieted by the pain here.  He is currently receiving Norco 10/325 p.o. q.6 hours p.r.n.  He is also on Flexeril 10 mg daily and Neurontin 1500 mg in the morning.  I am going to increase the narcotic here.  I do not really know what else can be done here.   I am doubtful that there is any intervention at the C-spine level that would help him.  He has followed in the past with a Dr. Frances Furbish of Neurology and she  last saw him, I think, in March of this year.   I will see if she wants to have another look at him.

## 2015-03-30 ENCOUNTER — Ambulatory Visit: Payer: Medicare HMO | Admitting: Neurology

## 2015-03-30 ENCOUNTER — Telehealth: Payer: Self-pay

## 2015-03-30 NOTE — Telephone Encounter (Signed)
Patient did not show to appt today  

## 2015-04-01 ENCOUNTER — Other Ambulatory Visit: Payer: Self-pay

## 2015-04-01 ENCOUNTER — Other Ambulatory Visit: Payer: Self-pay | Admitting: *Deleted

## 2015-04-01 MED ORDER — OXYCODONE HCL 10 MG PO TABS: ORAL_TABLET | ORAL | Status: DC

## 2015-04-01 NOTE — Telephone Encounter (Signed)
Rx faxed to [facility] @ [phone], phone number [phone]  

## 2015-04-01 NOTE — Telephone Encounter (Signed)
[name] 

## 2015-05-04 ENCOUNTER — Non-Acute Institutional Stay: Payer: Medicare HMO | Admitting: Internal Medicine

## 2015-05-04 DIAGNOSIS — G231 Progressive supranuclear ophthalmoplegia [Steele-Richardson-Olszewski]: Secondary | ICD-10-CM

## 2015-05-04 DIAGNOSIS — M792 Neuralgia and neuritis, unspecified: Secondary | ICD-10-CM

## 2015-05-04 DIAGNOSIS — M4802 Spinal stenosis, cervical region: Secondary | ICD-10-CM | POA: Diagnosis not present

## 2015-05-10 NOTE — Progress Notes (Addendum)
Patient ID: Alexander [name], male   DOB: 11-20-1955, 59 y.o.   MRN: [id number]                PROGRESS NOTE  DATE:  05/04/2015             [facility]                    LEVEL OF CARE:   SNF   Acute Visit                    CHIEF COMPLAINT:  Left arm pain/shoulder pain.          HISTORY OF PRESENT ILLNESS:  I had previously reviewed Mr. Alexander Callerrofitt for this on 03/16/2015.    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  He has had a longstanding PEG tube for tube feeding.  Nevertheless, once he is up in the wheelchair, he seems able to propel himself around the facility.    He also has a history of  a C1 fracture and has had two surgeries on his C-spine.  The original was in 2012.  His last MRI was in June 2016.  He was not felt to have any central spinal canal stenosis.  He has moderate C3-C4, C5-C6 height loss, demineralization in C5-C6.   A previous MRI done in 2013 suggested chronic multilevel cervical degenerative disease, including degenerative spinal stenosis in C3-C4, C5-C6 without spinal cord impingement.  At C6-C7, there was a chronic broad-based disc protrusion, severe bilateral C7 foraminal stenosis.    The patient's actual complaint continues to be pain that seems to radiate down to his left shoulder from his neck and then to the lateral three fingers of his left hand on the dorsal aspect.  He may also have a primary shoulder issue here as he has had rotator cuff surgery in the past.    When I saw him on 03/16/2015, I changed his Norco to oxycodone 10 mg p.o. q.6 hours p.r.n.  This has helped somewhat, he says, but not sufficient to make him comfortable.  When he is in bed at night, the pain is better but still a lot of pain.    PHYSICAL EXAMINATION:   CHEST/RESPIRATORY:  Clear air entry bilaterally.    CARDIOVASCULAR:   CARDIAC:  Heart sounds are normal.  He is not dehydrated.     NEUROLOGICAL:    MOTOR:  He has lower motor  neuron changes in his arm with a claw hand deformity.  He has wasting of the interossei, also the thenar eminence.  He has weak flexion at the elbow, very little extension.  He has no shoulder flexion or abduction.  No fasciculations are seen.   DEEP TENDON REFLEXES:  Reflexes are absent in the left arm.    ASSESSMENT/PLAN:                   Multilevel cervical degenerative disease, canal stenosis at C1.   I think this would probably explain his pain for the most part, i.e. radicular.  He may actually have some primary shoulder issues here.  It is difficult to be certain, but certainly pain that radiates from the neck to the top of your hand has to be radicular etiology, probably C7-C8.  He has not really responded well to the increase in the Norco.  He is also on Flexeril 10 mg three times a day, Neurontin 1500 in the morning.  He has not had any seizures, currently on Vimpat and Topamax.  I am doubtful that anything can be done at this level for the pain.  I am going to try something topically on the shoulder, perhaps Voltaren gel, and see if this helps at all.  If it does not, I think it can be stopped in two weeks.

## 2015-06-01 ENCOUNTER — Non-Acute Institutional Stay: Payer: Medicare HMO | Admitting: Internal Medicine

## 2015-06-01 DIAGNOSIS — R339 Retention of urine, unspecified: Secondary | ICD-10-CM

## 2015-06-01 DIAGNOSIS — N451 Epididymitis: Secondary | ICD-10-CM
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
## 2015-06-03 ENCOUNTER — Other Ambulatory Visit: Payer: Self-pay | Admitting: *Deleted

## 2015-06-03 MED ORDER — OXYCODONE HCL 10 MG PO TABS: ORAL_TABLET | ORAL | Status: DC

## 2015-06-03 NOTE — Telephone Encounter (Signed)
[name] 

## 2015-06-15 ENCOUNTER — Non-Acute Institutional Stay: Payer: Medicare HMO | Admitting: Internal Medicine

## 2015-06-15 DIAGNOSIS — R339 Retention of urine, unspecified: Secondary | ICD-10-CM | POA: Diagnosis not present

## 2015-06-21 NOTE — Progress Notes (Addendum)
Patient ID: Alexander [name], male   DOB: 04-08-1956, 59 y.o.   MRN: [id number]                PROGRESS NOTE  DATE:  06/15/2015              FACILITY: [facility]                  LEVEL OF CARE:   SNF   Acute Visit                     CHIEF COMPLAINT:  Follow up voiding issues.      HISTORY OF PRESENT ILLNESS:  I saw [name] two weeks ago.   At that point, he was having difficulty doing the voiding.  He kept a urinal between his legs, which was unusual.  Finally, the nurse did an in-and-out cath for over 700 cc.  The patient was not clearly complaining of dysuria.  He does not have a history of BPH or prostate surgery, but he was on Flomax which I increased from 0.4 to 0.8.  Since then, he had complained bitterly about the Foley catheter.  This was taken out yesterday.  I thought he might have epididymitis two weeks ago and gave him seven days' worth of Cipro.  His urine culture was no growth.    REVIEW OF SYSTEMS:   The patient is essentially nonverbal, although he is not having any difficulty voiding.    PHYSICAL EXAMINATION:   GENITOURINARY:   BLADDER:  I have checked him on two different occasions today.  He has no evidence of bladder distention.  No CVA tenderness.    ASSESSMENT/PLAN:                 Urinary retention.  I am not exactly sure what prompted this.  I thought he might have epididymitis.  I suppose that may have had something to do with it.  I also increased his Flomax from 0.4 to 0.8.  In any case, he appears to be voiding adequately.  He has no ongoing fever.

## 2015-06-22 ENCOUNTER — Non-Acute Institutional Stay: Payer: Medicare HMO | Admitting: Internal Medicine

## 2015-06-22 DIAGNOSIS — R339 Retention of urine, unspecified: Secondary | ICD-10-CM

## 2015-06-22 DIAGNOSIS — L0231 Cutaneous abscess of buttock: Secondary | ICD-10-CM
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
## 2015-07-12 ENCOUNTER — Other Ambulatory Visit: Payer: Self-pay | Admitting: *Deleted

## 2015-07-12 DIAGNOSIS — G40909 Epilepsy, unspecified, not intractable, without status epilepticus: Secondary | ICD-10-CM

## 2015-07-12 MED ORDER — LACOSAMIDE 100 MG PO TABS: ORAL_TABLET | ORAL | Status: DC

## 2015-07-12 NOTE — Telephone Encounter (Signed)
[name] 

## 2015-07-13 ENCOUNTER — Other Ambulatory Visit: Payer: Self-pay | Admitting: *Deleted

## 2015-07-13 MED ORDER — OXYCODONE HCL 10 MG PO TABS: ORAL_TABLET | ORAL | Status: DC

## 2015-07-13 NOTE — Telephone Encounter (Signed)
[name] 

## 2015-07-14 LAB — CBC AND DIFFERENTIAL
HCT: 40 % — AB (ref 41–53)
Hemoglobin: 12.5 g/dL — AB (ref 13.5–17.5)
Platelets: 159 10*3/uL (ref 150–399)

## 2015-07-14 LAB — BASIC METABOLIC PANEL
Creatinine: 1 mg/dL (ref <=1.3)
GLUCOSE: 119 mg/dL

## 2015-07-16 ENCOUNTER — Non-Acute Institutional Stay (SKILLED_NURSING_FACILITY): Payer: Medicare HMO | Admitting: Internal Medicine

## 2015-07-16 DIAGNOSIS — R4701 Aphasia: Secondary | ICD-10-CM

## 2015-07-16 DIAGNOSIS — R300 Dysuria: Secondary | ICD-10-CM

## 2015-07-19 ENCOUNTER — Encounter: Payer: Self-pay | Admitting: Internal Medicine

## 2015-07-19 DIAGNOSIS — R300 Dysuria: Secondary | ICD-10-CM | POA: Insufficient documentation

## 2015-07-19 DIAGNOSIS — R4701 Aphasia: Secondary | ICD-10-CM | POA: Insufficient documentation
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
## 2015-07-22 ENCOUNTER — Ambulatory Visit (INDEPENDENT_AMBULATORY_CARE_PROVIDER_SITE_OTHER): Payer: Medicare HMO | Admitting: Neurology

## 2015-07-22 ENCOUNTER — Encounter: Payer: Self-pay | Admitting: Neurology

## 2015-07-22 VITALS — BP 106/80 | HR 72 | Resp 12

## 2015-07-22 DIAGNOSIS — G959 Disease of spinal cord, unspecified: Secondary | ICD-10-CM | POA: Diagnosis not present

## 2015-07-22 DIAGNOSIS — S12000S Unspecified displaced fracture of first cervical vertebra, sequela: Secondary | ICD-10-CM | POA: Diagnosis not present

## 2015-07-22 DIAGNOSIS — G40909 Epilepsy, unspecified, not intractable, without status epilepticus: Secondary | ICD-10-CM | POA: Diagnosis not present

## 2015-07-22 NOTE — Patient Instructions (Signed)
We will check blood work today and call you with the test results. Continue Vimpat and Topamax at the current dose.
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
## 2015-07-23 LAB — COMPREHENSIVE METABOLIC PANEL
A/G RATIO: 1.4 (ref 1.1–2.5)
ALT: 99 IU/L — ABNORMAL HIGH (ref 0–44)
AST: 52 IU/L — AB (ref 0–40)
Albumin: 4.1 g/dL (ref 3.5–5.5)
Alkaline Phosphatase: 114 IU/L (ref 39–117)
BUN/Creatinine Ratio: 32 — ABNORMAL HIGH (ref 9–20)
BUN: 22 mg/dL (ref 6–24)
Bilirubin Total: <0.2 mg/dL (ref 0.0–1.2)
CHLORIDE: 99 mmol/L (ref 96–106)
CO2: 24 mmol/L (ref 18–29)
Calcium: 9.2 mg/dL (ref 8.7–10.2)
Creatinine, Ser: 0.68 mg/dL — ABNORMAL LOW (ref 0.76–1.27)
GFR calc Af Amer: 123 mL/min/{1.73_m2} (ref >59)
GFR calc non Af Amer: 106 mL/min/{1.73_m2} (ref >59)
GLOBULIN, TOTAL: 2.9 g/dL (ref 1.5–4.5)
GLUCOSE: 87 mg/dL (ref 65–99)
POTASSIUM: 5.1 mmol/L (ref 3.5–5.2)
SODIUM: 140 mmol/L (ref 134–144)
Total Protein: 7 g/dL (ref 6.0–8.5)

## 2015-07-23 LAB — CBC WITH DIFFERENTIAL/PLATELET
BASOS ABS: 0 10*3/uL (ref 0.0–0.2)
Basos: 0 %
EOS (ABSOLUTE): 0.4 10*3/uL (ref 0.0–0.4)
Eos: 4 %
Hematocrit: 43.4 % (ref 37.5–51.0)
Hemoglobin: 13.9 g/dL (ref 12.6–17.7)
IMMATURE GRANULOCYTES: 0 %
Immature Grans (Abs): 0 10*3/uL (ref 0.0–0.1)
Lymphocytes Absolute: 1.9 10*3/uL (ref 0.7–3.1)
Lymphs: 19 %
MCH: 30.8 pg (ref 26.6–33.0)
MCHC: 32 g/dL (ref 31.5–35.7)
MCV: 96 fL (ref 79–97)
MONOS ABS: 0.4 10*3/uL (ref 0.1–0.9)
Monocytes: 4 %
NEUTROS PCT: 73 %
Neutrophils Absolute: 7.2 10*3/uL — ABNORMAL HIGH (ref 1.4–7.0)
PLATELETS: 216 10*3/uL (ref 150–379)
RBC: 4.51 x10E6/uL (ref 4.14–5.80)
RDW: 13.8 % (ref 12.3–15.4)
WBC: 10 10*3/uL (ref 3.4–10.8)

## 2015-07-23 NOTE — Progress Notes (Signed)
Quick Note:  Please call his NH staff with blood test results: kidney function indicates possible mild dehydration, pls encourage fluid intake, I believe all through G tube and also, mildly elevated liver enzymes, likely d/t multiple medications, should be monitored by PCP.  Huston Foley, MD, PhD [facility] ([facility])  ______

## 2015-07-26 ENCOUNTER — Non-Acute Institutional Stay (SKILLED_NURSING_FACILITY): Payer: Medicare HMO | Admitting: Internal Medicine

## 2015-07-26 ENCOUNTER — Encounter: Payer: Self-pay | Admitting: Internal Medicine

## 2015-07-26 DIAGNOSIS — R339 Retention of urine, unspecified: Secondary | ICD-10-CM | POA: Diagnosis not present

## 2015-07-26 DIAGNOSIS — N400 Enlarged prostate without lower urinary tract symptoms: Secondary | ICD-10-CM
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
## 2015-08-02 ENCOUNTER — Encounter: Payer: Self-pay | Admitting: Internal Medicine

## 2015-08-02 ENCOUNTER — Non-Acute Institutional Stay (SKILLED_NURSING_FACILITY): Payer: Medicare HMO | Admitting: Internal Medicine

## 2015-08-02 DIAGNOSIS — G4700 Insomnia, unspecified: Secondary | ICD-10-CM | POA: Insufficient documentation

## 2015-08-02 DIAGNOSIS — J069 Acute upper respiratory infection, unspecified: Secondary | ICD-10-CM | POA: Diagnosis not present

## 2015-08-02 MED ORDER — MELATONIN 3 MG PO TABS: ORAL_TABLET | ORAL | Status: DC

## 2015-08-02 NOTE — Progress Notes (Signed)
[facility] Room Number: 409 A  Place of Service: [facility] (31)     Allergies  Allergen Reactions  . Amoxicillin Itching and Rash    Chief Complaint  Patient presents with  . Acute Visit    HPI:  Patient seen acutely at staff request. He's been coughing up yellow sputum and complaining of nasal congestion. Lungs have sounded clear to staff and oxygen saturations were 98% on room air.  Patient also complained of not being able sleep for the last 2 nights. He is R Eddy on trazodone 100 mg nightly for insomnia.  When last seen, 07/26/2015, this patient had urinary obstruction. He has known BPH. Urinalysis and culture was done which showed 35,000 colonies of enterococcus species. It was elected not to treat this. Foley catheters used because of the obstructive symptoms overnight and then removed on 07/27/2015. He has not had any further obstructive symptoms.  Medications: Patient's Medications  New Prescriptions   No medications on file  Previous Medications   ACETAMINOPHEN (TYLENOL) 650 MG CR TABLET    650 mg by Gastrostomy Tube route every 4 (four) hours as needed for pain.   ALLOPURINOL (ZYLOPRIM) 300 MG TABLET    300 mg by PEG Tube route daily.    BISACODYL (DULCOLAX) 10 MG SUPPOSITORY    Place 10 mg rectally as needed for moderate constipation.   CALCIUM-VITAMIN D (OSCAL WITH D) 500-200 MG-UNIT PER TABLET    1 tablet by PEG Tube route daily.   CARBOXYMETHYLCELLULOSE (REFRESH PLUS) 0.5 % SOLN    Place 1 drop into both eyes 3 (three) times daily as needed (dry eye).   DICLOFENAC SODIUM (VOLTAREN) 1 % GEL    Apply 2 g topically 4 (four) times daily.   ESOMEPRAZOLE (NEXIUM) 40 MG CAPSULE    40 mg by PEG Tube route daily. Take on empty stomach   FLUTICASONE (FLONASE) 50 MCG/ACT NASAL SPRAY    Place 1 spray into both nostrils every evening.    GABAPENTIN (NEURONTIN) 300 MG CAPSULE    Give 300 mg by tube See admin instructions. Take 5 tablets  (1546m) Via tube daily per MAR   LACOSAMIDE (VIMPAT) 100 MG TABS    Take one tablet via tube twice daily for seizures   LIDOCAINE (XYLOCAINE) 5 % OINTMENT    Apply 1 application topically as needed for moderate pain (Left shoulder).   NAPHAZOLINE HCL (CLEAR EYES OP)    Place 1 drop into both eyes daily.   NUTRITIONAL SUPPLEMENTS (ISOSOURCE 1.5 CAL) LIQD    Take 100 mLs by mouth See admin instructions. 1069mper hour for 13 hours and starts at 6pm-7am   OXYCODONE HCL 10 MG TABS    Take one tablet by mouth every 6 hours as needed for pain   POLYETHYLENE GLYCOL (MIRALAX / GLYCOLAX) PACKET    Place 17 g into feeding tube daily. Mix with 8 oz water and place in tube every other night at bedtime   TAMSULOSIN (FLOMAX) 0.4 MG CAPS CAPSULE    Take 1 capsule (0.4 mg total) by mouth daily.   TOPIRAMATE (TOPAMAX) 100 MG TABLET    Take 1 tablet (100 mg total) by mouth 2 (two) times daily. PEG tube   TRAZODONE (DESYREL) 100 MG TABLET    100 mg by PEG Tube route at bedtime.   TRAZODONE (DESYREL) 50 MG TABLET    25 mg by PEG Tube route 2 (two) times daily.    VENLAFAXINE (  EFFEXOR) 100 MG TABLET    100 mg by PEG Tube route 2 (two) times daily.   Modified Medications   No medications on file  Discontinued Medications   No medications on file     Review of Systems  Constitutional: Negative for fever and chills.  Eyes: Negative.   Respiratory: Positive for cough. Negative for shortness of breath.        Clear sputum  Cardiovascular: Negative for leg swelling.  Gastrointestinal: Negative for abdominal pain and blood in stool.  Genitourinary: Positive for dysuria, urgency and frequency. Negative for hematuria and flank pain.       BPH.  Musculoskeletal: Positive for back pain and neck pain.  Skin: Negative for rash.  Neurological: Positive for weakness.       Expressive aphasia  Psychiatric/Behavioral: Positive for sleep disturbance.    Filed Vitals:   08/02/15 1628  BP: 115/84  Pulse: 74  Temp:  97.5 F (36.4 C)  TempSrc: Oral  Resp: 19   Wt Readings from Last 3 Encounters:  07/19/15 176 lb (79.833 kg)  03/04/15 170 lb (77.111 kg)  12/29/14 189 lb (85.73 kg)    There is no weight on file to calculate BMI.  Physical Exam  Constitutional: No distress.  HENT:  Mouth/Throat: Oropharynx is clear and moist.  Eyes: EOM are normal. Pupils are equal, round, and reactive to light. Right eye exhibits no discharge. Left eye exhibits no discharge. No scleral icterus.  Neck: No JVD present. No tracheal deviation present. No thyromegaly present.  Cardiovascular: Normal rate, regular rhythm, normal heart sounds and intact distal pulses.   No murmur heard. Pulmonary/Chest: Effort normal and breath sounds normal. No stridor. No respiratory distress. He has no wheezes. He has no rales.  Abdominal: Soft. Bowel sounds are normal. He exhibits no distension and no mass. There is no tenderness. There is no guarding. No hernia.  PEG tube in place.   Genitourinary: Penis normal. No penile tenderness.  Musculoskeletal: He exhibits no edema or tenderness.  Lymphadenopathy:    He has no cervical adenopathy.  Neurological: He displays normal reflexes. No cranial nerve deficit. He exhibits normal muscle tone. Coordination normal.  Expressive aphasia  Skin: Skin is warm and dry. No rash noted. No erythema. No pallor.  Psychiatric: He has a normal mood and affect. His behavior is normal.     Labs reviewed: Lab Summary Latest Ref Rng 07/22/2015 07/14/2015 03/04/2015  Hemoglobin 12.6 - 17.7 g/dL 13.9 12.5(A) 14.5  Hematocrit 37.5 - 51.0 % 43.4 40(A) 45.5  White count 3.4 - 10.8 x10E3/uL 10.0 (None) 5.7  Platelet count 150 - 379 x10E3/uL 216 159 143(L)  Sodium 134 - 144 mmol/L 140 (None) 141  Potassium 3.5 - 5.2 mmol/L 5.1 (None) 3.6  Calcium 8.7 - 10.2 mg/dL 9.2 (None) 9.1  Phosphorus - (None) (None) (None)  Creatinine 0.76 - 1.27 mg/dL 0.68(L) 1.0 0.71  AST 0 - 40 IU/L 52(H) (None) 24  Alk Phos 39  - 117 IU/L 114 (None) 81  Bilirubin 0.0 - 1.2 mg/dL <0.2 (None) 0.9  Glucose 65 - 99 mg/dL 87 119 87  Cholesterol - (None) (None) (None)  HDL cholesterol - (None) (None) (None)  Triglycerides - (None) (None) (None)  LDL Direct - (None) (None) (None)  LDL Calc - (None) (None) (None)  Total protein 6.5 - 8.1 g/dL (None) (None) 6.6  Albumin 3.5 - 5.5 g/dL 4.1 (None) 4.0   No results found for: TSH Lab Results  Component Value Date  BUN 22 07/22/2015   BUN 16 03/04/2015   BUN 10 12/29/2014   Lab Results  Component Value Date   CREATININE 0.68* 07/22/2015   CREATININE 1.0 07/14/2015   CREATININE 0.71 03/04/2015   Lab Results  Component Value Date   HGBA1C 5.2 10/11/2013       Assessment/Plan  1. Acute upper respiratory infection -Add Robitussin DM 10 mL suspension every 4 hours when necessary cough and congestion  2. Insomnia -Add melatonin 3 mg nightly

## 2015-08-07 ENCOUNTER — Emergency Department (HOSPITAL_COMMUNITY): Payer: Medicare HMO

## 2015-08-07 ENCOUNTER — Encounter (HOSPITAL_COMMUNITY): Payer: Self-pay | Admitting: Emergency Medicine

## 2015-08-07 ENCOUNTER — Emergency Department (HOSPITAL_COMMUNITY)
Admission: EM | Admit: 2015-08-07 | Discharge: 2015-08-08 | Disposition: A | Discharge status: Home / Self Care | Payer: Medicare HMO | Attending: Emergency Medicine | Admitting: Emergency Medicine

## 2015-08-07 DIAGNOSIS — Z791 Long term (current) use of non-steroidal anti-inflammatories (NSAID): Secondary | ICD-10-CM | POA: Diagnosis not present

## 2015-08-07 DIAGNOSIS — E119 Type 2 diabetes mellitus without complications: Secondary | ICD-10-CM | POA: Insufficient documentation

## 2015-08-07 DIAGNOSIS — Z87891 Personal history of nicotine dependence: Secondary | ICD-10-CM | POA: Insufficient documentation

## 2015-08-07 DIAGNOSIS — Z8701 Personal history of pneumonia (recurrent): Secondary | ICD-10-CM | POA: Diagnosis not present

## 2015-08-07 DIAGNOSIS — Z8669 Personal history of other diseases of the nervous system and sense organs: Secondary | ICD-10-CM | POA: Diagnosis not present

## 2015-08-07 DIAGNOSIS — N50812 Left testicular pain: Secondary | ICD-10-CM | POA: Insufficient documentation

## 2015-08-07 DIAGNOSIS — Z8781 Personal history of (healed) traumatic fracture: Secondary | ICD-10-CM | POA: Diagnosis not present

## 2015-08-07 DIAGNOSIS — M1990 Unspecified osteoarthritis, unspecified site: Secondary | ICD-10-CM | POA: Diagnosis not present

## 2015-08-07 DIAGNOSIS — F329 Major depressive disorder, single episode, unspecified: Secondary | ICD-10-CM | POA: Diagnosis not present

## 2015-08-07 DIAGNOSIS — Z79899 Other long term (current) drug therapy: Secondary | ICD-10-CM | POA: Insufficient documentation

## 2015-08-07 DIAGNOSIS — M109 Gout, unspecified: Secondary | ICD-10-CM | POA: Diagnosis not present

## 2015-08-07 DIAGNOSIS — Z8673 Personal history of transient ischemic attack (TIA), and cerebral infarction without residual deficits: Secondary | ICD-10-CM | POA: Insufficient documentation

## 2015-08-07 DIAGNOSIS — Z880 Allergy status to penicillin: Secondary | ICD-10-CM | POA: Diagnosis not present

## 2015-08-07 DIAGNOSIS — N50819 Testicular pain, unspecified: Secondary | ICD-10-CM

## 2015-08-07 DIAGNOSIS — R300 Dysuria: Secondary | ICD-10-CM | POA: Diagnosis not present

## 2015-08-07 DIAGNOSIS — I10 Essential (primary) hypertension: Secondary | ICD-10-CM | POA: Diagnosis not present

## 2015-08-07 DIAGNOSIS — N50811 Right testicular pain: Secondary | ICD-10-CM | POA: Insufficient documentation

## 2015-08-07 LAB — CBC WITH DIFFERENTIAL/PLATELET
BASOS ABS: 0 10*3/uL (ref 0.0–0.1)
BASOS PCT: 1 %
EOS ABS: 0.6 10*3/uL (ref 0.0–0.7)
EOS PCT: 9 %
HCT: 42 % (ref 39.0–52.0)
Hemoglobin: 13.5 g/dL (ref 13.0–17.0)
LYMPHS PCT: 35 %
Lymphs Abs: 2.3 10*3/uL (ref 0.7–4.0)
MCH: 31.1 pg (ref 26.0–34.0)
MCHC: 32.1 g/dL (ref 30.0–36.0)
MCV: 96.8 fL (ref 78.0–100.0)
MONO ABS: 0.5 10*3/uL (ref 0.1–1.0)
Monocytes Relative: 7 %
Neutro Abs: 3.2 10*3/uL (ref 1.7–7.7)
Neutrophils Relative %: 48 %
PLATELETS: 150 10*3/uL (ref 150–400)
RBC: 4.34 MIL/uL (ref 4.22–5.81)
RDW: 13.8 % (ref 11.5–15.5)
WBC: 6.6 10*3/uL (ref 4.0–10.5)

## 2015-08-07 NOTE — ED Notes (Signed)
Bed: OZ30 Expected date:  Expected time:  Means of arrival:  Comments: EMS 75M scrotal pain
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
## 2015-08-07 NOTE — ED Notes (Signed)
Brought in by [facility] from [facility] with c/o scrotal pain.  Per [facility], staff at the facility reported that pt has been having pain and redness to his scrotum today.   Pt also c/o pain on urination which has been ongoing for 2 days.

## 2015-08-08 DIAGNOSIS — N50812 Left testicular pain: Secondary | ICD-10-CM | POA: Diagnosis not present

## 2015-08-08 LAB — BASIC METABOLIC PANEL WITH GFR
Anion gap: 10 (ref 5–15)
BUN: 22 mg/dL — ABNORMAL HIGH (ref 6–20)
CO2: 22 mmol/L (ref 22–32)
Calcium: 9.1 mg/dL (ref 8.9–10.3)
Chloride: 110 mmol/L (ref 101–111)
Creatinine, Ser: 0.53 mg/dL — ABNORMAL LOW (ref 0.61–1.24)
GFR calc Af Amer: >60 mL/min (ref >60)
GFR calc non Af Amer: >60 mL/min (ref >60)
Glucose, Bld: 95 mg/dL (ref 65–99)
Potassium: 3.6 mmol/L (ref 3.5–5.1)
Sodium: 142 mmol/L (ref 135–145)

## 2015-08-08 LAB — URINALYSIS, ROUTINE W REFLEX MICROSCOPIC
BILIRUBIN URINE: NEGATIVE
Glucose, UA: NEGATIVE mg/dL
HGB URINE DIPSTICK: NEGATIVE
KETONES UR: NEGATIVE mg/dL
NITRITE: NEGATIVE
PH: 8 (ref 5.0–8.0)
Protein, ur: NEGATIVE mg/dL
Specific Gravity, Urine: 1.017 (ref 1.005–1.030)

## 2015-08-08 LAB — URINE MICROSCOPIC-ADD ON: RBC / HPF: NONE SEEN RBC/hpf (ref 0–5)

## 2015-08-08 MED ORDER — NAPROXEN 375 MG PO TABS: 375.0000 mg | ORAL_TABLET | Freq: Two times a day (BID) | ORAL | Status: DC

## 2015-08-08 MED ORDER — NAPROXEN 500 MG PO TABS
500.0000 mg | ORAL_TABLET | Freq: Once | ORAL | Status: AC
  Administered 2015-08-08: 500 mg via ORAL
  Filled 2015-08-08: qty 1

## 2015-08-08 NOTE — Discharge Instructions (Signed)
Continue taking your home oxycodone for pain. Follow-up with urology if symptoms persist. May also follow-up with your primary care physician. Return to the ED for any new/worsening symptoms.

## 2015-08-08 NOTE — ED Notes (Signed)
Staff at [facility] was called and pt's discharge instructions provided.

## 2015-08-08 NOTE — ED Provider Notes (Signed)
Patient seen originally by Sharilyn Sites, PA-C.  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 Patient reports he has had testicle pain x 2 days, worse in left testicle, mild in right. He states he has also had some dysuria. Denies fever, chills, sweats. No recent testicle trauma. Patient takes home oxycodone for pain. VSS."  Labs Reviewed  URINALYSIS, ROUTINE W REFLEX MICROSCOPIC (NOT AT [facility]) - Abnormal; Notable for the following:    APPearance TURBID (*)    Leukocytes, UA TRACE (*)    All other components within normal limits  BASIC METABOLIC PANEL - Abnormal; Notable for the following:    BUN 22 (*)    Creatinine, Ser 0.53 (*)    All other components within normal limits  URINE MICROSCOPIC-ADD ON - Abnormal; Notable for the following:    Squamous Epithelial / LPF 0-5 (*)    Bacteria, UA RARE (*)    All other components within normal limits  URINE CULTURE  CBC WITH DIFFERENTIAL/PLATELET    US Scrotum (Final result) Result time: 08/08/15 01:31:05   Final result by Rad Results In Interface (08/08/15 01:31:05)   Narrative:   CLINICAL DATA: Testicle pain, side not specified  EXAM: SCROTAL ULTRASOUND  DOPPLER ULTRASOUND OF THE TESTICLES  TECHNIQUE: Complete ultrasound examination of the testicles, epididymis, and other scrotal structures was performed. Color and spectral Doppler ultrasound were also utilized to evaluate blood flow to the testicles.  COMPARISON: None.  FINDINGS: Right testicle  Measurements: 41 x 24 x 29 mm. No mass or microlithiasis visualized.  Left testicle  Measurements: 40 x 23 x 29 mm. Striated heterogeneous appearance which does not correlate with vessels on color Doppler images. No signs of infection or torsion on doppler. As noted in the literature, this is often  incidental and potentially related to fibrosis.  Simple 21 mm cyst of the low left inguinal canal, likely encysted hydrocele.  Right epididymis: Normal in size and appearance.  Left epididymis: Normal in size and appearance.  Hydrocele: None significant  Varicocele: None visualized.  Pulsed Doppler interrogation of both testes demonstrates normal low resistance arterial and venous waveforms bilaterally.  IMPRESSION: 1. Negative for testicular torsion. 2. Unilateral striated left testicle without vascular abnormality, usually incidental. Clinical followup recommended with sonography if needed. 3. Simple 21 mm left inguinal cyst, likely encysted hydrocele.   Electronically Signed By: Marnee Spring M.D. On: 08/08/2015 01:31          [place] Art/Ven Flow Abd Pelv Doppler (Final result) Result time: 08/08/15 01:31:05   Final result by Rad Results In Interface (08/08/15 01:31:05)   Narrative:   CLINICAL DATA: Testicle pain, side not specified  EXAM: SCROTAL ULTRASOUND  DOPPLER ULTRASOUND OF THE TESTICLES  TECHNIQUE: Complete ultrasound examination of the testicles, epididymis, and other scrotal structures was performed. Color and spectral Doppler ultrasound were also utilized to evaluate blood flow to the testicles.  COMPARISON: None.  FINDINGS: Right testicle  Measurements: 41 x 24 x 29 mm. No mass or microlithiasis visualized.  Left testicle  Measurements: 40 x 23 x 29 mm. Striated heterogeneous appearance which does not correlate with vessels on color Doppler images. No signs of infection or torsion on doppler. As noted in the literature, this is often incidental and potentially related to fibrosis.  Simple 21 mm cyst of the low left inguinal canal, likely encysted hydrocele.  Right epididymis:  Normal in size and appearance.  Left epididymis: Normal in size and appearance.  Hydrocele: None significant  Varicocele: None  visualized.  Pulsed Doppler interrogation of both testes demonstrates normal low resistance arterial and venous waveforms bilaterally.  IMPRESSION: 1. Negative for testicular torsion. 2. Unilateral striated left testicle without vascular abnormality, usually incidental. Clinical followup recommended with sonography if needed. 3. Simple 21 mm left inguinal cyst, likely encysted hydrocele.   Electronically Signed By: Marnee Spring M.D. On: 08/08/2015 01:31    Patient does not have any acute abnormality on scrotal US. He does have some non specific findings to which he will be referred to Urology. Will prescribe Naproxen to be taken PRN for pain.  Medications  naproxen (NAPROSYN) tablet 500 mg (500 mg Oral Given 08/08/15 0126)     I feel the patient has had an appropriate workup for their chief complaint at this time and likelihood of emergent condition existing is low. Discussed s/sx that warrant return to the ED.  Filed Vitals:   08/08/15 0045 08/08/15 0100  BP:  112/81  Pulse: 78   Temp:    Resp:         Marlon Pel, PA-C 08/08/15 0148  April Palumbo, MD 08/08/15 [phone]

## 2015-08-08 NOTE — ED Notes (Signed)
[facility] here to transport pt back to [facility] and [facility].

## 2015-08-09 ENCOUNTER — Non-Acute Institutional Stay (SKILLED_NURSING_FACILITY): Payer: Medicare HMO | Admitting: Internal Medicine

## 2015-08-09 ENCOUNTER — Encounter: Payer: Self-pay | Admitting: Internal Medicine

## 2015-08-09 DIAGNOSIS — N50819 Testicular pain, unspecified: Secondary | ICD-10-CM | POA: Diagnosis not present

## 2015-08-09 MED ORDER — AMOXICILLIN-POT CLAVULANATE 875-125 MG PO TABS: ORAL_TABLET | ORAL | Status: DC

## 2015-08-09 NOTE — Progress Notes (Signed)
[place] Room Number: 676 A  Place of Service: SNF (31)     Allergies  Allergen Reactions  . Amoxicillin Itching and Rash    NOTED ON [facility]    Chief Complaint  Patient presents with  . Acute Visit    HPI:  Acute visit for evaluation of testicular discomfort since 08/06/2015. Patient is afebrile. There is no adenopathy in the groin or elsewhere.  Patient had 35,000 colonies enterococcus cultured from urine 07/30/2015.  Nose recent episode of urinary retention for which he was catheterized overnight. No longer retaining uterine. BPH.  Medications: Patient's Medications  New Prescriptions   No medications on file  Previous Medications   ACETAMINOPHEN (TYLENOL) 650 MG CR TABLET    650 mg by Gastrostomy Tube route every 4 (four) hours as needed for pain.   ALLOPURINOL (ZYLOPRIM) 300 MG TABLET    300 mg by PEG Tube route daily.    BISACODYL (DULCOLAX) 10 MG SUPPOSITORY    Place 10 mg rectally as needed for moderate constipation.   CALCIUM-VITAMIN D (OSCAL WITH D) 500-200 MG-UNIT PER TABLET    1 tablet by PEG Tube route daily.   CARBOXYMETHYLCELLULOSE (REFRESH PLUS) 0.5 % SOLN    Place 1 drop into both eyes 3 (three) times daily as needed (dry eye).   DICLOFENAC SODIUM (VOLTAREN) 1 % GEL    Apply 2 g topically 4 (four) times daily.   ESOMEPRAZOLE (NEXIUM) 40 MG CAPSULE    40 mg by PEG Tube route daily. Take on empty stomach   FLUTICASONE (FLONASE) 50 MCG/ACT NASAL SPRAY    Place 1 spray into both nostrils every evening.    GABAPENTIN (NEURONTIN) 300 MG CAPSULE    Give 1,500 mg by tube See admin instructions. Take 5 tablets (1569m) Via tube daily per MAR   GUAIFENESIN-DEXTROMETHORPHAN (ROBITUSSIN DM) 100-10 MG/5ML SYRUP    Take 5 mLs by mouth every 4 (four) hours as needed for cough.   LACOSAMIDE (VIMPAT) 100 MG TABS    Take one tablet via tube twice daily for seizures   LIDOCAINE (XYLOCAINE) 5 % OINTMENT    Apply 1 application topically 2  (two) times daily as needed (for left shoulder and neck pain).   MELATONIN 3 MG TABS    1 nightly for sleep   NAPHAZOLINE HCL (CLEAR EYES OP)    Place 1 drop into both eyes daily.   NUTRITIONAL SUPPLEMENTS (ISOSOURCE 1.5 CAL) LIQD    Take 100 mLs by mouth See admin instructions. 1059mper hour for 13 hours and starts at 6pm-7am   OXYCODONE HCL 10 MG TABS    Take one tablet by mouth every 6 hours as needed for pain   POLYETHYLENE GLYCOL (MIRALAX / GLYCOLAX) PACKET    Place 17 g into feeding tube daily. Mix with 8 oz water and place in tube every other night at bedtime   PROMETHAZINE (PHENERGAN) 25 MG TABLET    Take 25 mg by mouth every 4 (four) hours as needed for nausea or vomiting.   TAMSULOSIN (FLOMAX) 0.4 MG CAPS CAPSULE    Take 1 capsule (0.4 mg total) by mouth daily.   TOPIRAMATE (TOPAMAX) 100 MG TABLET    Take 1 tablet (100 mg total) by mouth 2 (two) times daily. PEG tube   TRAZODONE (DESYREL) 100 MG TABLET    100 mg by PEG Tube route at bedtime.   VENLAFAXINE (EFFEXOR) 100 MG TABLET    100 mg by  PEG Tube route 2 (two) times daily.   Modified Medications   No medications on file  Discontinued Medications   LIDOCAINE (XYLOCAINE) 5 % OINTMENT    Apply 1 application topically as needed for moderate pain (Left shoulder).   NAPROXEN (NAPROSYN) 375 MG TABLET    Take 1 tablet (375 mg total) by mouth 2 (two) times daily.   TRAZODONE (DESYREL) 50 MG TABLET    25 mg by PEG Tube route 2 (two) times daily.      Review of Systems  Constitutional: Negative for fever and chills.  Eyes: Negative.   Respiratory: Positive for cough. Negative for shortness of breath.        Clear sputum  Cardiovascular: Negative for leg swelling.  Gastrointestinal: Negative for abdominal pain and blood in stool.  Genitourinary: Positive for dysuria, urgency and frequency. Negative for hematuria and flank pain.       BPH. Tender both testicles since 08/06/2015. Worse on the right side. Foul-smelling urine.    Musculoskeletal: Positive for back pain and neck pain.  Skin: Negative for rash.  Neurological: Positive for weakness.       Expressive aphasia  Psychiatric/Behavioral: Positive for sleep disturbance.    Filed Vitals:   08/09/15 0956  BP: 136/58  Pulse: 62  Temp: 98 F (36.7 C)  TempSrc: Oral  Resp: 18   Wt Readings from Last 3 Encounters:  07/19/15 176 lb (79.833 kg)  03/04/15 170 lb (77.111 kg)  12/29/14 189 lb (85.73 kg)    There is no weight on file to calculate BMI.  Physical Exam  Constitutional: No distress.  HENT:  Mouth/Throat: Oropharynx is clear and moist.  Eyes: EOM are normal. Pupils are equal, round, and reactive to light. Right eye exhibits no discharge. Left eye exhibits no discharge. No scleral icterus.  Neck: No JVD present. No tracheal deviation present. No thyromegaly present.  Cardiovascular: Normal rate, regular rhythm, normal heart sounds and intact distal pulses.   No murmur heard. Pulmonary/Chest: Effort normal and breath sounds normal. No stridor. No respiratory distress. He has no wheezes. He has no rales.  Abdominal: Soft. Bowel sounds are normal. He exhibits no distension and no mass. There is no tenderness. There is no guarding. No hernia.  PEG tube in place.   Genitourinary: Penis normal. No penile tenderness.  Tenderness in both testicles. Foul-smelling urine.  Musculoskeletal: He exhibits no edema or tenderness.  Lymphadenopathy:    He has no cervical adenopathy.  Neurological: He displays normal reflexes. No cranial nerve deficit. He exhibits normal muscle tone. Coordination normal.  Expressive aphasia  Skin: Skin is warm and dry. No rash noted. No erythema. No pallor.  Psychiatric: He has a normal mood and affect. His behavior is normal.     Labs reviewed: Lab Summary Latest Ref Rng 08/07/2015 07/22/2015 07/14/2015 03/04/2015  Hemoglobin 13.0 - 17.0 g/dL 13.5 13.9 12.5(A) 14.5  Hematocrit 39.0 - 52.0 % 42.0 43.4 40(A) 45.5  White count  4.0 - 10.5 K/uL 6.6 10.0 (None) 5.7  Platelet count 150 - 400 K/uL 150 216 159 143(L)  Sodium 135 - 145 mmol/L 142 140 (None) 141  Potassium 3.5 - 5.1 mmol/L 3.6 5.1 (None) 3.6  Calcium 8.9 - 10.3 mg/dL 9.1 9.2 (None) 9.1  Phosphorus - (None) (None) (None) (None)  Creatinine 0.61 - 1.24 mg/dL 0.53(L) 0.68(L) 1.0 0.71  AST 0 - 40 IU/L (None) 52(H) (None) 24  Alk Phos 39 - 117 IU/L (None) 114 (None) 81  Bilirubin 0.0 -  1.2 mg/dL (None) <0.2 (None) 0.9  Glucose 65 - 99 mg/dL 95 87 119 87  Cholesterol - (None) (None) (None) (None)  HDL cholesterol - (None) (None) (None) (None)  Triglycerides - (None) (None) (None) (None)  LDL Direct - (None) (None) (None) (None)  LDL Calc - (None) (None) (None) (None)  Total protein 6.5 - 8.1 g/dL (None) (None) (None) 6.6  Albumin 3.5 - 5.5 g/dL (None) 4.1 (None) 4.0   No results found for: TSH Lab Results  Component Value Date   BUN 22* 08/07/2015   BUN 22 07/22/2015   BUN 16 03/04/2015   Lab Results  Component Value Date   CREATININE 0.53* 08/07/2015   CREATININE 0.68* 07/22/2015   CREATININE 1.0 07/14/2015   Lab Results  Component Value Date   HGBA1C 5.2 10/11/2013       Assessment/Plan  1. Pain in testicle Worse in the right side -Urinalysis and culture -Augmentin 875 twice a day 10 days

## 2015-08-10 LAB — URINE CULTURE: Culture: >=100000

## 2015-08-11 ENCOUNTER — Telehealth (HOSPITAL_COMMUNITY): Payer: Self-pay

## 2015-08-11 NOTE — Telephone Encounter (Addendum)
Post ED Visit - Positive Culture Follow-up: Successful Patient Follow-Up  Culture assessed and recommendations reviewed by:  Enzo Bi, Pharm.D.  Celedonio Miyamoto, Pharm.D., BCPS  Garvin Fila, Pharm.D.  Georgina Pillion, [address].D., BCPS  [place], [place].D., BCPS, AAHIVP  Estella Husk, Pharm.D., BCPS, AAHIVP  Tennis Must, Pharm.D.  Sherle Poe, [address].D.  Positive urine culture, >/= 100,000 colonies -> Proteus Mirabilis   Patient discharged without antimicrobial prescription and treatment is now indicated(Placed on Augmentin @ facility 08/09/2015)? Allergy to PCN?  Organism is resistant to prescribed ED discharge antimicrobial  Patient with positive blood cultures  Changes discussed with ED provider: Ivar Drape PA  New antibiotic prescription Keflex 500 mg po BID x 10 days (if pt not taking/tolerating  Augmentin) Called to   Contacted patient, date 08/11/2015 time 12:00Cx result faxed to [facility] [address] [phone] for review   Arvid Right 08/11/2015, 12:08 PM

## 2015-08-11 NOTE — Progress Notes (Signed)
ED Antimicrobial Stewardship Positive Culture Follow Up   Alexander [name] is an 60 y.o. male who presented to [facility] on 08/07/2015 with a chief complaint of  Chief Complaint  Patient presents with  . Testicle Pain    Recent Results (from the past 720 hour(s))  Urine culture     Status: None   Collection Time: 08/08/15 12:01 AM  Result Value Ref Range Status   Specimen Description URINE, CATHETERIZED  Final   Special Requests NONE  Final   Culture   Final    >=100,000 COLONIES/mL PROTEUS MIRABILIS Performed at [facility]    Report Status 08/10/2015 FINAL  Final   Organism ID, Bacteria PROTEUS MIRABILIS  Final      Susceptibility   Proteus mirabilis - MIC*    AMPICILLIN >=32 RESISTANT Resistant     CEFAZOLIN <=4 SENSITIVE Sensitive     CEFTRIAXONE <=1 SENSITIVE Sensitive     CIPROFLOXACIN >=4 RESISTANT Resistant     GENTAMICIN <=1 SENSITIVE Sensitive     IMIPENEM 2 SENSITIVE Sensitive     NITROFURANTOIN 128 RESISTANT Resistant     TRIMETH/SULFA >=320 RESISTANT Resistant     AMPICILLIN/SULBACTAM <=2 SENSITIVE Sensitive     PIP/TAZO <=4 SENSITIVE Sensitive     * >=100,000 COLONIES/mL PROTEUS MIRABILIS     Patient discharged originally without antimicrobial agent and treatment is now indicated   Plan: Check w/ [facility] to see if patient taking Augmentin.  If taking Augmentin and no allergic reaction, continue with Augmentin If not taking Augmentin or has developed a rash, stop Augmentin and add Keflex  PO BID x 10 days  ED Provider:  Roxy Horseman PA-C   Armandina Stammer 08/11/2015, 9:26 AM Infectious Diseases Pharmacist Phone# [phone]

## 2015-08-30 ENCOUNTER — Encounter: Payer: Self-pay | Admitting: Nurse Practitioner

## 2015-08-30 ENCOUNTER — Non-Acute Institutional Stay: Payer: Medicare HMO | Admitting: Nurse Practitioner

## 2015-08-30 DIAGNOSIS — F321 Major depressive disorder, single episode, moderate: Secondary | ICD-10-CM | POA: Diagnosis not present

## 2015-08-30 DIAGNOSIS — G4700 Insomnia, unspecified: Secondary | ICD-10-CM

## 2015-08-30 DIAGNOSIS — R569 Unspecified convulsions: Secondary | ICD-10-CM

## 2015-08-30 DIAGNOSIS — N400 Enlarged prostate without lower urinary tract symptoms: Secondary | ICD-10-CM | POA: Diagnosis not present

## 2015-08-30 DIAGNOSIS — M109 Gout, unspecified: Secondary | ICD-10-CM | POA: Diagnosis not present
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
## 2015-09-01 ENCOUNTER — Encounter (HOSPITAL_COMMUNITY): Payer: Self-pay | Admitting: Emergency Medicine

## 2015-09-01 ENCOUNTER — Emergency Department (HOSPITAL_COMMUNITY): Payer: Medicare HMO

## 2015-09-01 ENCOUNTER — Emergency Department (HOSPITAL_COMMUNITY)
Admission: EM | Admit: 2015-09-01 | Discharge: 2015-09-01 | Disposition: A | Discharge status: Home / Self Care | Payer: Medicare HMO | Attending: Emergency Medicine | Admitting: Emergency Medicine

## 2015-09-01 DIAGNOSIS — Z880 Allergy status to penicillin: Secondary | ICD-10-CM | POA: Diagnosis not present

## 2015-09-01 DIAGNOSIS — Y9389 Activity, other specified: Secondary | ICD-10-CM | POA: Diagnosis not present

## 2015-09-01 DIAGNOSIS — Z8701 Personal history of pneumonia (recurrent): Secondary | ICD-10-CM | POA: Insufficient documentation

## 2015-09-01 DIAGNOSIS — Y998 Other external cause status: Secondary | ICD-10-CM | POA: Diagnosis not present

## 2015-09-01 DIAGNOSIS — Z79899 Other long term (current) drug therapy: Secondary | ICD-10-CM | POA: Diagnosis not present

## 2015-09-01 DIAGNOSIS — Z9889 Other specified postprocedural states: Secondary | ICD-10-CM | POA: Insufficient documentation

## 2015-09-01 DIAGNOSIS — E119 Type 2 diabetes mellitus without complications: Secondary | ICD-10-CM | POA: Diagnosis not present

## 2015-09-01 DIAGNOSIS — Y92128 Other place in nursing home as the place of occurrence of the external cause: Secondary | ICD-10-CM | POA: Insufficient documentation

## 2015-09-01 DIAGNOSIS — Z8669 Personal history of other diseases of the nervous system and sense organs: Secondary | ICD-10-CM | POA: Insufficient documentation

## 2015-09-01 DIAGNOSIS — M1990 Unspecified osteoarthritis, unspecified site: Secondary | ICD-10-CM | POA: Insufficient documentation

## 2015-09-01 DIAGNOSIS — S199XXA Unspecified injury of neck, initial encounter: Secondary | ICD-10-CM | POA: Insufficient documentation

## 2015-09-01 DIAGNOSIS — Z791 Long term (current) use of non-steroidal anti-inflammatories (NSAID): Secondary | ICD-10-CM | POA: Diagnosis not present

## 2015-09-01 DIAGNOSIS — Z8781 Personal history of (healed) traumatic fracture: Secondary | ICD-10-CM | POA: Insufficient documentation

## 2015-09-01 DIAGNOSIS — I10 Essential (primary) hypertension: Secondary | ICD-10-CM | POA: Diagnosis not present

## 2015-09-01 DIAGNOSIS — Z87820 Personal history of traumatic brain injury: Secondary | ICD-10-CM | POA: Diagnosis not present

## 2015-09-01 DIAGNOSIS — M109 Gout, unspecified: Secondary | ICD-10-CM | POA: Diagnosis not present

## 2015-09-01 DIAGNOSIS — F329 Major depressive disorder, single episode, unspecified: Secondary | ICD-10-CM | POA: Diagnosis not present

## 2015-09-01 DIAGNOSIS — S7001XA Contusion of right hip, initial encounter: Secondary | ICD-10-CM | POA: Diagnosis not present

## 2015-09-01 DIAGNOSIS — W1839XA Other fall on same level, initial encounter: Secondary | ICD-10-CM | POA: Diagnosis not present

## 2015-09-01 DIAGNOSIS — W19XXXA Unspecified fall, initial encounter: Secondary | ICD-10-CM

## 2015-09-01 DIAGNOSIS — S79911A Unspecified injury of right hip, initial encounter: Secondary | ICD-10-CM | POA: Diagnosis present

## 2015-09-01 DIAGNOSIS — Z87891 Personal history of nicotine dependence: Secondary | ICD-10-CM | POA: Diagnosis not present

## 2015-09-01 DIAGNOSIS — Z7951 Long term (current) use of inhaled steroids: Secondary | ICD-10-CM | POA: Insufficient documentation

## 2015-09-01 DIAGNOSIS — M542 Cervicalgia: Secondary | ICD-10-CM

## 2015-09-01 LAB — TSH: TSH: 1.52 u[IU]/mL (ref 0.41–5.90)

## 2015-09-01 MED ORDER — OXYCODONE HCL 5 MG PO TABS: 5.0000 mg | ORAL_TABLET | Freq: Four times a day (QID) | ORAL | Status: DC | PRN

## 2015-09-01 MED ORDER — OXYCODONE HCL 5 MG PO TABS
5.0000 mg | ORAL_TABLET | Freq: Once | ORAL | Status: AC
  Administered 2015-09-01: 5 mg via ORAL
  Filled 2015-09-01: qty 1

## 2015-09-01 NOTE — ED Notes (Signed)
Called [facility] for transport.  

## 2015-09-01 NOTE — Discharge Instructions (Signed)
°Cervical Strain and Sprain With Rehab °Cervical strain and sprain are injuries that commonly occur with "whiplash" injuries. Whiplash occurs when the neck is forcefully whipped backward or forward, such as during a motor vehicle accident or during contact sports. The muscles, ligaments, tendons, discs, and nerves of the neck are susceptible to injury when this occurs. °RISK FACTORS °Risk of having a whiplash injury increases if: °· Osteoarthritis of the spine. °· Situations that make head or neck accidents or trauma more likely. °· High-risk sports (football, rugby, wrestling, hockey, auto racing, gymnastics, diving, contact karate, or boxing). °· Poor strength and flexibility of the neck. °· Previous neck injury. °· Poor tackling technique. °· Improperly fitted or padded equipment. °SYMPTOMS  °· Pain or stiffness in the front or back of neck or both. °· Symptoms may present immediately or up to 24 hours after injury. °· Dizziness, headache, nausea, and vomiting. °· Muscle spasm with soreness and stiffness in the neck. °· Tenderness and swelling at the injury site. °PREVENTION °· Learn and use proper technique (avoid tackling with the head, spearing, and head-butting; use proper falling techniques to avoid landing on the head). °· Warm up and stretch properly before activity. °· Maintain physical fitness: °¨ Strength, flexibility, and endurance. °¨ Cardiovascular fitness. °· Wear properly fitted and padded protective equipment, such as padded soft collars, for participation in contact sports. °PROGNOSIS  °Recovery from cervical strain and sprain injuries is dependent on the extent of the injury. These injuries are usually curable in 1 week to 3 months with appropriate treatment.  °RELATED COMPLICATIONS  °· Temporary numbness and weakness may occur if the nerve roots are damaged, and this may persist until the nerve has completely healed. °· Chronic pain due to frequent recurrence of symptoms. °· Prolonged healing,  especially if activity is resumed too soon (before complete recovery). °TREATMENT  °Treatment initially involves the use of ice and medication to help reduce pain and inflammation. It is also important to perform strengthening and stretching exercises and modify activities that worsen symptoms so the injury does not get worse. These exercises may be performed at home or with a therapist. For patients who experience severe symptoms, a soft, padded collar may be recommended to be worn around the neck.  °Improving your posture may help reduce symptoms. Posture improvement includes pulling your chin and abdomen in while sitting or standing. If you are sitting, sit in a firm chair with your buttocks against the back of the chair. While sleeping, try replacing your pillow with a small towel rolled to 2 inches in diameter, or use a cervical pillow or soft cervical collar. Poor sleeping positions delay healing.  °For patients with nerve root damage, which causes numbness or weakness, the use of a cervical traction apparatus may be recommended. Surgery is rarely necessary for these injuries. However, cervical strain and sprains that are present at birth (congenital) may require surgery. °MEDICATION  °· If pain medication is necessary, nonsteroidal anti-inflammatory medications, such as aspirin and ibuprofen, or other minor pain relievers, such as acetaminophen, are often recommended. °· Do not take pain medication for 7 days before surgery. °· Prescription pain relievers may be given if deemed necessary by your caregiver. Use only as directed and only as much as you need. °HEAT AND COLD:  °· Cold treatment (icing) relieves pain and reduces inflammation. Cold treatment should be applied for 10 to 15 minutes every 2 to 3 hours for inflammation and pain and immediately after any activity that aggravates your   HEAT AND COLD:   · Cold treatment (icing) relieves pain and reduces inflammation. Cold treatment should be applied for 10 to 15 minutes every 2 to 3 hours for inflammation and pain and immediately after any activity that aggravates your symptoms. Use ice packs or an ice massage.  · Heat treatment may be used prior to performing the stretching and  strengthening activities prescribed by your caregiver, physical therapist, or athletic trainer. Use a heat pack or a warm soak.  SEEK MEDICAL CARE IF:   · Symptoms get worse or do not improve in 2 weeks despite treatment.  · New, unexplained symptoms develop (drugs used in treatment may produce side effects).  EXERCISES  RANGE OF MOTION (ROM) AND STRETCHING EXERCISES - Cervical Strain and Sprain  These exercises may help you when beginning to rehabilitate your injury. In order to successfully resolve your symptoms, you must improve your posture. These exercises are designed to help reduce the forward-head and rounded-shoulder posture which contributes to this condition. Your symptoms may resolve with or without further involvement from your physician, physical therapist or athletic trainer. While completing these exercises, remember:   · Restoring tissue flexibility helps normal motion to return to the joints. This allows healthier, less painful movement and activity.  · An effective stretch should be held for at least 20 seconds, although you may need to begin with shorter hold times for comfort.  · A stretch should never be painful. You should only feel a gentle lengthening or release in the stretched tissue.  STRETCH- Axial Extensors  · Lie on your back on the floor. You may bend your knees for comfort. Place a rolled-up hand towel or dish towel, about 2 inches in diameter, under the part of your head that makes contact with the floor.  · Gently tuck your chin, as if trying to make a "double chin," until you feel a gentle stretch at the base of your head.  · Hold __________ seconds.  Repeat __________ times. Complete this exercise __________ times per day.   STRETCH - Axial Extension   · Stand or sit on a firm surface. Assume a good posture: chest up, shoulders drawn back, abdominal muscles slightly tense, knees unlocked (if standing) and feet hip width apart.  · Slowly retract your chin so your head slides back  and your chin slightly lowers. Continue to look straight ahead.  · You should feel a gentle stretch in the back of your head. Be certain not to feel an aggressive stretch since this can cause headaches later.  · Hold for __________ seconds.  Repeat __________ times. Complete this exercise __________ times per day.  STRETCH - Cervical Side Bend   · Stand or sit on a firm surface. Assume a good posture: chest up, shoulders drawn back, abdominal muscles slightly tense, knees unlocked (if standing) and feet hip width apart.  · Without letting your nose or shoulders move, slowly tip your right / left ear to your shoulder until your feel a gentle stretch in the muscles on the opposite side of your neck.  · Hold __________ seconds.  Repeat __________ times. Complete this exercise __________ times per day.  STRETCH - Cervical Rotators   · Stand or sit on a firm surface. Assume a good posture: chest up, shoulders drawn back, abdominal muscles slightly tense, knees unlocked (if standing) and feet hip width apart.  · Keeping your eyes level with the ground, slowly turn your head until you feel a gentle stretch along   the back and opposite side of your neck.  · Hold __________ seconds.  Repeat __________ times. Complete this exercise __________ times per day.  RANGE OF MOTION - Neck Circles   · Stand or sit on a firm surface. Assume a good posture: chest up, shoulders drawn back, abdominal muscles slightly tense, knees unlocked (if standing) and feet hip width apart.  · Gently roll your head down and around from the back of one shoulder to the back of the other. The motion should never be forced or painful.  · Repeat the motion 10-20 times, or until you feel the neck muscles relax and loosen.  Repeat __________ times. Complete the exercise __________ times per day.  STRENGTHENING EXERCISES - Cervical Strain and Sprain  These exercises may help you when beginning to rehabilitate your injury. They may resolve your symptoms with or  without further involvement from your physician, physical therapist, or athletic trainer. While completing these exercises, remember:   · Muscles can gain both the endurance and the strength needed for everyday activities through controlled exercises.  · Complete these exercises as instructed by your physician, physical therapist, or athletic trainer. Progress the resistance and repetitions only as guided.  · You may experience muscle soreness or fatigue, but the pain or discomfort you are trying to eliminate should never worsen during these exercises. If this pain does worsen, stop and make certain you are following the directions exactly. If the pain is still present after adjustments, discontinue the exercise until you can discuss the trouble with your clinician.  STRENGTH - Cervical Flexors, Isometric  · Face a wall, standing about 6 inches away. Place a small pillow, a ball about 6-8 inches in diameter, or a folded towel between your forehead and the wall.  · Slightly tuck your chin and gently push your forehead into the soft object. Push only with mild to moderate intensity, building up tension gradually. Keep your jaw and forehead relaxed.  · Hold 10 to 20 seconds. Keep your breathing relaxed.  · Release the tension slowly. Relax your neck muscles completely before you start the next repetition.  Repeat __________ times. Complete this exercise __________ times per day.  STRENGTH- Cervical Lateral Flexors, Isometric   · Stand about 6 inches away from a wall. Place a small pillow, a ball about 6-8 inches in diameter, or a folded towel between the side of your head and the wall.  · Slightly tuck your chin and gently tilt your head into the soft object. Push only with mild to moderate intensity, building up tension gradually. Keep your jaw and forehead relaxed.  · Hold 10 to 20 seconds. Keep your breathing relaxed.  · Release the tension slowly. Relax your neck muscles completely before you start the next  repetition.  Repeat __________ times. Complete this exercise __________ times per day.  STRENGTH - Cervical Extensors, Isometric   · Stand about 6 inches away from a wall. Place a small pillow, a ball about 6-8 inches in diameter, or a folded towel between the back of your head and the wall.  · Slightly tuck your chin and gently tilt your head back into the soft object. Push only with mild to moderate intensity, building up tension gradually. Keep your jaw and forehead relaxed.  · Hold 10 to 20 seconds. Keep your breathing relaxed.  · Release the tension slowly. Relax your neck muscles completely before you start the next repetition.  Repeat __________ times. Complete this exercise __________ times per day.    All of your joints have less wear and tear when properly supported by a spine with good posture. This means you will experience a healthier, less painful body.  Correct posture must be practiced with all of your activities, especially prolonged sitting and standing. Correct posture is as important when doing repetitive low-stress activities (typing) as it is when doing a single heavy-load activity (lifting). PROLONGED STANDING WHILE SLIGHTLY LEANING FORWARD When completing a task that requires you to lean forward while standing in one  place for a long time, place either foot up on a stationary 2- to 4-inch high object to help maintain the best posture. When both feet are on the ground, the low back tends to lose its slight inward curve. If this curve flattens (or becomes too large), then the back and your other joints will experience too much stress, fatigue more quickly, and can cause pain.  RESTING POSITIONS Consider which positions are most painful for you when choosing a resting position. If you have pain with flexion-based activities (sitting, bending, stooping, squatting), choose a position that allows you to rest in a less flexed posture. You would want to avoid curling into a fetal position on your side. If your pain worsens with extension-based activities (prolonged standing, working overhead), avoid resting in an extended position such as sleeping on your stomach. Most people will find more comfort when they rest with their spine in a more neutral position, neither too rounded nor too arched. Lying on a non-sagging bed on your side with a pillow between your knees, or on your back with a pillow under your knees will often provide some relief. Keep in mind, being in any one position for a prolonged period of time, no matter how correct your posture, can still lead to stiffness. WALKING Walk with an upright posture. Your ears, shoulders, and hips should all line up. OFFICE WORK When working at a desk, create an environment that supports good, upright posture. Without extra support, muscles fatigue and lead to excessive strain on joints and other tissues. CHAIR:  A chair should be able to slide under your desk when your back makes contact with the back of the chair. This allows you to work closely.  The chair's height should allow your eyes to be level with the upper part of your monitor and your hands to be slightly lower than your elbows.  Body position:  Your feet should make contact with the floor. If this is not  possible, use a foot rest.  Keep your ears over your shoulders. This will reduce stress on your neck and low back.   This information is not intended to replace advice given to you by your health care provider. Make sure you discuss any questions you have with your health care provider.   Document Released: 06/19/2005 Document Revised: 07/10/2014 Document Reviewed: 10/01/2008 Else[employer] Interactive Patient Education 2016 [employer]  Contusion A contusion is a deep bruise. Contusions are the result of a blunt injury to tissues and muscle fibers under the skin. The injury causes bleeding under the skin. The skin overlying the contusion may turn blue, purple, or yellow. Minor injuries will give you a painless contusion, but more severe contusions may stay painful and swollen for a few weeks.  CAUSES  This condition is usually caused by a blow, trauma, or direct force to an area of the body. SYMPTOMS  Symptoms of this condition include:  Swelling of the injured area.  Pain and tenderness in the injured  area.  Discoloration. The area may have redness and then turn blue, purple, or yellow. DIAGNOSIS  This condition is diagnosed based on a physical exam and medical history. An X-ray, CT scan, or MRI may be needed to determine if there are any associated injuries, such as broken bones (fractures). TREATMENT  Specific treatment for this condition depends on what area of the body was injured. In general, the best treatment for a contusion is resting, icing, applying pressure to (compression), and elevating the injured area. This is often called the RICE strategy. Over-the-counter anti-inflammatory medicines may also be recommended for pain control.  HOME CARE INSTRUCTIONS   Rest the injured area.  If directed, apply ice to the injured area:  Put ice in a plastic bag.  Place a towel between your skin and the bag.  Leave the ice on for 20 minutes, 2-3 times per day.  If directed, apply  light compression to the injured area using an elastic bandage. Make sure the bandage is not wrapped too tightly. Remove and reapply the bandage as directed by your health care provider.  If possible, raise (elevate) the injured area above the level of your heart while you are sitting or lying down.  Take over-the-counter and prescription medicines only as told by your health care provider. SEEK MEDICAL CARE IF:  Your symptoms do not improve after several days of treatment.  Your symptoms get worse.  You have difficulty moving the injured area. SEEK IMMEDIATE MEDICAL CARE IF:   You have severe pain.  You have numbness in a hand or foot.  Your hand or foot turns pale or cold.   This information is not intended to replace advice given to you by your health care provider. Make sure you discuss any questions you have with your health care provider.   Document Released: 03/29/2005 Document Revised: 03/10/2015 Document Reviewed: 11/04/2014 [employer] Interactive Patient Education [employer]

## 2015-09-01 NOTE — ED Notes (Signed)
Bed: WA06 Expected date:  Expected time:  Means of arrival:  Comments: EMS-fall 

## 2015-09-01 NOTE — ED Notes (Signed)
Pt presents via EMS from [facility] after an unwitnessed fall.  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  He is able to speak in single-word phrases but mostly communicates using thumbs-up and thumbs-down.  He has pain in his head, neck, right forearm, and posterior right thigh.  Normally he is ambulatory with assistance at the facility but on arrival is unable to straighten his right leg which is definitely a change from baseline per EMS.  No obvious deformities noted on extremities.  No additional complaints or concerns at this time.
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
## 2015-09-22 ENCOUNTER — Encounter: Payer: Self-pay | Admitting: Nurse Practitioner

## 2015-09-22 ENCOUNTER — Non-Acute Institutional Stay (SKILLED_NURSING_FACILITY): Payer: Medicare HMO | Admitting: Nurse Practitioner

## 2015-09-22 DIAGNOSIS — L219 Seborrheic dermatitis, unspecified: Secondary | ICD-10-CM | POA: Diagnosis not present

## 2015-09-22 DIAGNOSIS — R569 Unspecified convulsions: Secondary | ICD-10-CM

## 2015-09-22 DIAGNOSIS — G4700 Insomnia, unspecified: Secondary | ICD-10-CM

## 2015-09-22 DIAGNOSIS — N400 Enlarged prostate without lower urinary tract symptoms: Secondary | ICD-10-CM

## 2015-09-22 DIAGNOSIS — J069 Acute upper respiratory infection, unspecified: Secondary | ICD-10-CM | POA: Diagnosis not present

## 2015-09-22 DIAGNOSIS — E1100 Type 2 diabetes mellitus with hyperosmolarity without nonketotic hyperglycemic-hyperosmolar coma (NKHHC): Secondary | ICD-10-CM | POA: Diagnosis not present

## 2015-09-22 DIAGNOSIS — F320 Major depressive disorder, single episode, mild: Secondary | ICD-10-CM | POA: Diagnosis not present

## 2015-09-22 DIAGNOSIS — L210 Seborrhea capitis: Secondary | ICD-10-CM

## 2015-09-22 DIAGNOSIS — K5900 Constipation, unspecified: Secondary | ICD-10-CM

## 2015-09-22 NOTE — Assessment & Plan Note (Deleted)
Seizure free since last seen, continue Topamax 100mg  bid.
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
## 2015-09-22 NOTE — Assessment & Plan Note (Signed)
Mood is controlled, continue Effexor 100mg  bid, Trazodone 100mg  qhs and 50mg  bid prn.

## 2015-09-22 NOTE — Assessment & Plan Note (Signed)
Last Hgb a1c 5.2 10/01/13

## 2015-09-22 NOTE — Assessment & Plan Note (Signed)
Stable, continue MiraLax daily.  

## 2015-09-22 NOTE — Assessment & Plan Note (Addendum)
Z-pk and Robitussin 10mg  q6hr x 72 hours. CXR to r/o PNA. DuoNeb tid x 72hrs.

## 2015-09-22 NOTE — Assessment & Plan Note (Signed)
Seizure free since last seen, continue Topamax 100mg bid.  

## 2015-09-22 NOTE — Assessment & Plan Note (Addendum)
No urinary retention. Continue Tamsulosin 0.8mg  daily.

## 2015-09-22 NOTE — Assessment & Plan Note (Signed)
Continue Effexor 100mg  bid, Trazodone 100mg  qhs and 50mg  bid.

## 2015-10-01 ENCOUNTER — Non-Acute Institutional Stay (SKILLED_NURSING_FACILITY): Payer: Medicare HMO | Admitting: Internal Medicine

## 2015-10-01 ENCOUNTER — Encounter: Payer: Self-pay | Admitting: Internal Medicine

## 2015-10-01 DIAGNOSIS — J42 Unspecified chronic bronchitis: Secondary | ICD-10-CM | POA: Diagnosis not present
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
## 2015-10-04 ENCOUNTER — Encounter: Payer: Self-pay | Admitting: Internal Medicine

## 2015-10-04 ENCOUNTER — Non-Acute Institutional Stay (SKILLED_NURSING_FACILITY): Payer: Medicare HMO | Admitting: Internal Medicine

## 2015-10-04 DIAGNOSIS — S61502A Unspecified open wound of left wrist, initial encounter: Secondary | ICD-10-CM | POA: Diagnosis not present

## 2015-10-04 DIAGNOSIS — S61509A Unspecified open wound of unspecified wrist, initial encounter: Secondary | ICD-10-CM | POA: Insufficient documentation

## 2015-10-04 DIAGNOSIS — J42 Unspecified chronic bronchitis: Secondary | ICD-10-CM
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
## 2015-10-13 ENCOUNTER — Other Ambulatory Visit: Payer: Self-pay

## 2015-10-13 ENCOUNTER — Other Ambulatory Visit: Payer: Self-pay | Admitting: Internal Medicine

## 2015-10-13 MED ORDER — OXYCODONE HCL 10 MG PO TABS: ORAL_TABLET | ORAL | Status: DC

## 2015-11-04 ENCOUNTER — Encounter: Payer: Self-pay | Admitting: Internal Medicine

## 2015-11-04 ENCOUNTER — Non-Acute Institutional Stay (SKILLED_NURSING_FACILITY): Payer: Medicare HMO | Admitting: Internal Medicine

## 2015-11-04 DIAGNOSIS — H9202 Otalgia, left ear: Secondary | ICD-10-CM | POA: Diagnosis not present
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
## 2015-11-06 DIAGNOSIS — H9202 Otalgia, left ear: Secondary | ICD-10-CM | POA: Insufficient documentation

## 2015-11-10 ENCOUNTER — Encounter: Payer: Self-pay | Admitting: Nurse Practitioner

## 2015-11-10 ENCOUNTER — Non-Acute Institutional Stay: Payer: Medicare HMO | Admitting: Nurse Practitioner

## 2015-11-10 DIAGNOSIS — L309 Dermatitis, unspecified: Secondary | ICD-10-CM

## 2015-11-10 DIAGNOSIS — F320 Major depressive disorder, single episode, mild: Secondary | ICD-10-CM

## 2015-11-10 DIAGNOSIS — G4700 Insomnia, unspecified: Secondary | ICD-10-CM | POA: Diagnosis not present

## 2015-11-10 DIAGNOSIS — N400 Enlarged prostate without lower urinary tract symptoms: Secondary | ICD-10-CM | POA: Diagnosis not present

## 2015-11-10 DIAGNOSIS — R569 Unspecified convulsions: Secondary | ICD-10-CM

## 2015-11-10 DIAGNOSIS — K5900 Constipation, unspecified: Secondary | ICD-10-CM

## 2015-11-10 DIAGNOSIS — E1100 Type 2 diabetes mellitus with hyperosmolarity without nonketotic hyperglycemic-hyperosmolar coma (NKHHC): Secondary | ICD-10-CM | POA: Diagnosis not present

## 2015-11-10 NOTE — Assessment & Plan Note (Signed)
Mood is controlled, continue Effexor 100mg  bid, Trazodone 100mg  qhs and 50mg  bid prn.

## 2015-11-10 NOTE — Assessment & Plan Note (Signed)
Last Hgb a1c 5.2 10/01/13, diet controlled.

## 2015-11-10 NOTE — Assessment & Plan Note (Signed)
Stable, continue MiraLax daily.  
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
## 2015-11-10 NOTE — Assessment & Plan Note (Signed)
Continue Effexor 100mg  bid, Trazodone 100mg  qhs and 50mg  bid.

## 2015-11-10 NOTE — Assessment & Plan Note (Signed)
Red raised spots arms, thighs, upper back, will apply hydrocortisone 1% cream bid until healed.

## 2015-11-10 NOTE — Assessment & Plan Note (Signed)
No urinary retention. Continue Tamsulosin 0.8mg  daily.

## 2015-11-10 NOTE — Assessment & Plan Note (Addendum)
Seizure free since last seen, continue Topamax 100mg  bid, Vimpat

## 2015-11-12 ENCOUNTER — Emergency Department (HOSPITAL_COMMUNITY)
Admission: EM | Admit: 2015-11-12 | Discharge: 2015-11-13 | Disposition: A | Discharge status: Home / Self Care | Payer: Medicare HMO | Attending: Emergency Medicine | Admitting: Emergency Medicine

## 2015-11-12 ENCOUNTER — Encounter (HOSPITAL_COMMUNITY): Payer: Self-pay | Admitting: Emergency Medicine

## 2015-11-12 ENCOUNTER — Emergency Department (HOSPITAL_COMMUNITY): Payer: Medicare HMO

## 2015-11-12 DIAGNOSIS — F329 Major depressive disorder, single episode, unspecified: Secondary | ICD-10-CM | POA: Insufficient documentation

## 2015-11-12 DIAGNOSIS — E7800 Pure hypercholesterolemia, unspecified: Secondary | ICD-10-CM | POA: Diagnosis not present

## 2015-11-12 DIAGNOSIS — Z87891 Personal history of nicotine dependence: Secondary | ICD-10-CM | POA: Insufficient documentation

## 2015-11-12 DIAGNOSIS — I10 Essential (primary) hypertension: Secondary | ICD-10-CM | POA: Diagnosis not present

## 2015-11-12 DIAGNOSIS — E119 Type 2 diabetes mellitus without complications: Secondary | ICD-10-CM | POA: Insufficient documentation

## 2015-11-12 DIAGNOSIS — Z8673 Personal history of transient ischemic attack (TIA), and cerebral infarction without residual deficits: Secondary | ICD-10-CM | POA: Insufficient documentation

## 2015-11-12 DIAGNOSIS — Z7951 Long term (current) use of inhaled steroids: Secondary | ICD-10-CM | POA: Diagnosis not present

## 2015-11-12 DIAGNOSIS — M1990 Unspecified osteoarthritis, unspecified site: Secondary | ICD-10-CM | POA: Insufficient documentation

## 2015-11-12 DIAGNOSIS — R1031 Right lower quadrant pain: Secondary | ICD-10-CM | POA: Insufficient documentation

## 2015-11-12 DIAGNOSIS — Z79891 Long term (current) use of opiate analgesic: Secondary | ICD-10-CM | POA: Diagnosis not present

## 2015-11-12 DIAGNOSIS — H6092 Unspecified otitis externa, left ear: Secondary | ICD-10-CM | POA: Diagnosis not present

## 2015-11-12 DIAGNOSIS — R109 Unspecified abdominal pain: Secondary | ICD-10-CM

## 2015-11-12 DIAGNOSIS — Z79899 Other long term (current) drug therapy: Secondary | ICD-10-CM | POA: Diagnosis not present

## 2015-11-12 DIAGNOSIS — R339 Retention of urine, unspecified: Secondary | ICD-10-CM | POA: Diagnosis present

## 2015-11-12 LAB — CBC WITH DIFFERENTIAL/PLATELET
BASOS ABS: 0 10*3/uL (ref 0.0–0.1)
Basophils Relative: 0 %
EOS ABS: 0.4 10*3/uL (ref 0.0–0.7)
EOS PCT: 6 %
HEMATOCRIT: 42.7 % (ref 39.0–52.0)
Hemoglobin: 14 g/dL (ref 13.0–17.0)
Lymphocytes Relative: 30 %
Lymphs Abs: 1.8 10*3/uL (ref 0.7–4.0)
MCH: 31.7 pg (ref 26.0–34.0)
MCHC: 32.8 g/dL (ref 30.0–36.0)
MCV: 96.8 fL (ref 78.0–100.0)
MONO ABS: 0.3 10*3/uL (ref 0.1–1.0)
MONOS PCT: 6 %
Neutro Abs: 3.4 10*3/uL (ref 1.7–7.7)
Neutrophils Relative %: 58 %
PLATELETS: 140 10*3/uL — AB (ref 150–400)
RBC: 4.41 MIL/uL (ref 4.22–5.81)
RDW: 12.9 % (ref 11.5–15.5)
WBC: 5.9 10*3/uL (ref 4.0–10.5)

## 2015-11-12 LAB — BASIC METABOLIC PANEL
ANION GAP: 7 (ref 5–15)
BUN: 18 mg/dL (ref 6–20)
CALCIUM: 9.1 mg/dL (ref 8.9–10.3)
CO2: 28 mmol/L (ref 22–32)
CREATININE: 0.49 mg/dL — AB (ref 0.61–1.24)
Chloride: 101 mmol/L (ref 101–111)
GFR calc Af Amer: >60 mL/min (ref >60)
GLUCOSE: 88 mg/dL (ref 65–99)
Potassium: 4 mmol/L (ref 3.5–5.1)
Sodium: 136 mmol/L (ref 135–145)

## 2015-11-12 LAB — URINALYSIS, ROUTINE W REFLEX MICROSCOPIC
BILIRUBIN URINE: NEGATIVE
GLUCOSE, UA: NEGATIVE mg/dL
HGB URINE DIPSTICK: NEGATIVE
KETONES UR: NEGATIVE mg/dL
LEUKOCYTES UA: NEGATIVE
Nitrite: NEGATIVE
PH: 7 (ref 5.0–8.0)
PROTEIN: NEGATIVE mg/dL
Specific Gravity, Urine: 1.034 — ABNORMAL HIGH (ref 1.005–1.030)

## 2015-11-12 MED ORDER — NEOMYCIN-POLYMYXIN-HC 1 % OT SOLN: 3.0000 [drp] | Freq: Four times a day (QID) | OTIC | Status: DC

## 2015-11-12 MED ORDER — ONDANSETRON HCL 4 MG/2ML IJ SOLN
4.0000 mg | Freq: Once | INTRAMUSCULAR | Status: AC
  Administered 2015-11-12: 4 mg via INTRAVENOUS
  Filled 2015-11-12: qty 2

## 2015-11-12 MED ORDER — DIATRIZOATE MEGLUMINE & SODIUM 66-10 % PO SOLN: 30.0000 mL | Freq: Once | ORAL | Status: DC

## 2015-11-12 MED ORDER — MORPHINE SULFATE (PF) 2 MG/ML IV SOLN
2.0000 mg | Freq: Once | INTRAVENOUS | Status: AC
  Administered 2015-11-12: 2 mg via INTRAVENOUS
  Filled 2015-11-12: qty 1

## 2015-11-12 MED ORDER — IOPAMIDOL (ISOVUE-300) INJECTION 61%
100.0000 mL | Freq: Once | INTRAVENOUS | Status: AC | PRN
  Administered 2015-11-12: 100 mL via INTRAVENOUS

## 2015-11-12 NOTE — Discharge Instructions (Signed)
Abdominal Pain, Adult Many things can cause abdominal pain. Usually, abdominal pain is not caused by a disease and will improve without treatment. It can often be observed and treated at home. Your health care provider will do a physical exam and possibly order blood tests and X-rays to help determine the seriousness of your pain. However, in many cases, more time must pass before a clear cause of the pain can be found. Before that point, your health care provider may not know if you need more testing or further treatment. HOME CARE INSTRUCTIONS Monitor your abdominal pain for any changes. The following actions may help to alleviate any discomfort you are experiencing:  Only take over-the-counter or prescription medicines as directed by your health care provider.  Do not take laxatives unless directed to do so by your health care provider.  Try a clear liquid diet (broth, tea, or water) as directed by your health care provider. Slowly move to a bland diet as tolerated. SEEK MEDICAL CARE IF:  You have unexplained abdominal pain.  You have abdominal pain associated with nausea or diarrhea.  You have pain when you urinate or have a bowel movement.  You experience abdominal pain that wakes you in the night.  You have abdominal pain that is worsened or improved by eating food.  You have abdominal pain that is worsened with eating fatty foods.  You have a fever. SEEK IMMEDIATE MEDICAL CARE IF:  Your pain does not go away within 2 hours.  You keep throwing up (vomiting).  Your pain is felt only in portions of the abdomen, such as the right side or the left lower portion of the abdomen.  You pass bloody or black tarry stools. MAKE SURE YOU:  Understand these instructions.  Will watch your condition.  Will get help right away if you are not doing well or get worse.   This information is not intended to replace advice given to you by your health care provider. Make sure you discuss  any questions you have with your health care provider.   Document Released: 03/29/2005 Document Revised: 03/10/2015 Document Reviewed: 02/26/2013 Elsevier Interactive Patient Education 2016 Else[employer] Inc. Otitis Externa Otitis externa is a bacterial or fungal infection of the outer ear canal. This is the area from the eardrum to the outside of the ear. Otitis externa is sometimes called "swimmer's ear." CAUSES  Possible causes of infection include:  Swimming in dirty water.  Moisture remaining in the ear after swimming or bathing.  Mild injury (trauma) to the ear.  Objects stuck in the ear (foreign body).  Cuts or scrapes (abrasions) on the outside of the ear. SIGNS AND SYMPTOMS  The first symptom of infection is often itching in the ear canal. Later signs and symptoms may include swelling and redness of the ear canal, ear pain, and yellowish-white fluid (pus) coming from the ear. The ear pain may be worse when pulling on the earlobe. DIAGNOSIS  Your health care provider will perform a physical exam. A sample of fluid may be taken from the ear and examined for bacteria or fungi. TREATMENT  Antibiotic ear drops are often given for 10 to 14 days. Treatment may also include pain medicine or corticosteroids to reduce itching and swelling. HOME CARE INSTRUCTIONS   Apply antibiotic ear drops to the ear canal as prescribed by your health care provider.  Take medicines only as directed by your health care provider.  If you have diabetes, follow any additional treatment instructions from your  health care provider.  Keep all follow-up visits as directed by your health care provider. PREVENTION   Keep your ear dry. Use the corner of a towel to absorb water out of the ear canal after swimming or bathing.  Avoid scratching or putting objects inside your ear. This can damage the ear canal or remove the protective wax that lines the canal. This makes it easier for bacteria and fungi to  grow.  Avoid swimming in lakes, polluted water, or poorly chlorinated pools.  You may use ear drops made of rubbing alcohol and vinegar after swimming. Combine equal parts of white vinegar and alcohol in a bottle. Put 3 or 4 drops into each ear after swimming. SEEK MEDICAL CARE IF:   You have a fever.  Your ear is still red, swollen, painful, or draining pus after 3 days.  Your redness, swelling, or pain gets worse.  You have a severe headache.  You have redness, swelling, pain, or tenderness in the area behind your ear. MAKE SURE YOU:   Understand these instructions.  Will watch your condition.  Will get help right away if you are not doing well or get worse.   This information is not intended to replace advice given to you by your health care provider. Make sure you discuss any questions you have with your health care provider.   Document Released: 06/19/2005 Document Revised: 07/10/2014 Document Reviewed: 07/06/2011 Elsevier Interactive Patient Education [employer]2016 Elsevier Inc[employer]

## 2015-11-12 NOTE — ED Notes (Addendum)
[facility] per Staff pt did not urinate for 15 hrs. I&O cath was done with 500ml return. Pt has not urinated since. Pt also has L ear pain and lower abd pain. Pt adds that his penis hurts more than anything from the I&O cath. Pt has peg tube in place. Pt is A&O at his baseline per facility. Pt in NAD

## 2015-11-12 NOTE — ED Notes (Signed)
Administered contrast via peg tube per CT and EDP. Gave 60 ml via syringe with 40 ml saline flush. These amts and instructions were given by CT techs.

## 2015-11-12 NOTE — ED Notes (Signed)
Bed: WA13 Expected date:  Expected time:  Means of arrival:  Comments: EMS-urinary retention 
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
## 2015-11-12 NOTE — ED Notes (Signed)
Called [facility] again after 2 hours, pt is next on list

## 2015-11-12 NOTE — ED Notes (Addendum)
Pt sts that he is having abd pain 8/10. Notified Dr Freida BusmanAllen

## 2015-11-12 NOTE — ED Notes (Signed)
Patient unable to provide urine sample

## 2015-11-12 NOTE — ED Notes (Signed)
Bladder scan read 314 mls.

## 2015-11-14 LAB — URINE CULTURE: CULTURE: NO GROWTH

## 2015-11-18 ENCOUNTER — Non-Acute Institutional Stay (SKILLED_NURSING_FACILITY): Payer: Medicare HMO | Admitting: Internal Medicine

## 2015-11-18 ENCOUNTER — Encounter: Payer: Self-pay | Admitting: Internal Medicine

## 2015-11-18 DIAGNOSIS — H9202 Otalgia, left ear: Secondary | ICD-10-CM

## 2015-11-18 DIAGNOSIS — L309 Dermatitis, unspecified: Secondary | ICD-10-CM

## 2015-11-18 MED ORDER — CLOTRIMAZOLE-BETAMETHASONE 1-0.05 % EX CREA: TOPICAL_CREAM | CUTANEOUS | Status: DC

## 2015-11-18 NOTE — Progress Notes (Signed)
Patient ID: Alexander [name], male   DOB: May 08, 1956, 60 y.o.   MRN: [id number]    [place] Room Number: 431  Place of Service: [facility] (31)     Allergies  Allergen Reactions  . Amoxicillin Itching and Rash    NOTED ON [facility]    Chief Complaint  Patient presents with  . Acute Visit    raw between legs    HPI:  Patient was seen for an acute visit today for complaints of feeling raw between his legs. There has been a rash. He has had previous problems with urinary tract infection but urine does not have a bad odor at the present time and he is not complaining suprapubic discomfort. He does have a constant leakage of urine against the skin.  Medications: Patient's Medications  New Prescriptions   No medications on file  Previous Medications   ACETAMINOPHEN (TYLENOL) 325 MG TABLET    650 mg by Gastric Tube route every 4 (four) hours as needed for fever or headache.   ALLOPURINOL (ZYLOPRIM) 300 MG TABLET    300 mg by PEG Tube route daily.    BISACODYL (DULCOLAX) 10 MG SUPPOSITORY    Place 10 mg rectally as needed for moderate constipation.   CALCIUM-VITAMIN D (OSCAL WITH D) 500-200 MG-UNIT PER TABLET    1 tablet by PEG Tube route daily.   CARBOXYMETHYLCELLULOSE (REFRESH PLUS) 0.5 % SOLN    Place 1 drop into both eyes 3 (three) times daily as needed (dry eye).   DEXTROMETHORPHAN-GUAIFENESIN (MUCINEX DM PO)    Take by mouth. 30 cc every 8 hours   DICLOFENAC SODIUM (VOLTAREN) 1 % GEL    Apply 2 g topically 4 (four) times daily.   ESOMEPRAZOLE (NEXIUM) 40 MG CAPSULE    40 mg by PEG Tube route daily. Take on empty stomach   FLUTICASONE (FLONASE) 50 MCG/ACT NASAL SPRAY    Place 1 spray into both nostrils every evening.    GABAPENTIN (NEURONTIN) 300 MG CAPSULE    See admin instructions. Take 5 tablets (1537m) Via tube daily per [facility]   HYDROCORTISONE CREAM 1 %    Apply 1 application topically 2 (two) times daily.   LACOSAMIDE (VIMPAT) 100 MG TABS    1  tablet by PEG Tube route 2 (two) times daily. For seizures   LIDOCAINE (XYLOCAINE) 5 % OINTMENT    Apply 1 application topically 2 (two) times daily as needed (for left shoulder and neck pain).   MELATONIN 3 MG TABS    1 nightly for sleep   NAPHAZOLINE HCL (CLEAR EYES OP)    Place 1 drop into both eyes daily.   NEOMYCIN-COLISTIN-HYDROCORTISONE-THONZONIUM (CORTISPORIN TC) 3.09-02-08-0.5 MG/ML OTIC SUSPENSION    Place 3 drops into the left ear 4 (four) times daily.   NEOMYCIN-POLYMYXIN-HYDROCORTISONE (CORTISPORIN) 1 % SOLN OTIC SOLUTION    Place 3 drops into the left ear every 6 (six) hours.   NUTRITIONAL SUPPLEMENTS (ISOSOURCE 1.5 CAL) LIQD    Take 1,000 mLs by mouth. For 13 hours--start at 0900 end at 1900   OXCARBAZEPINE (TRILEPTAL) 300 MG TABLET    Take 300 mg by mouth at bedtime.   OXYCODONE HCL 10 MG TABS    Take one tablet by mouth every 6 hours as needed for pain   POLYETHYLENE GLYCOL (MIRALAX / GLYCOLAX) PACKET    Place 17 g into feeding tube daily. Mix with 8 oz water and place in tube every other night at bedtime  TAMSULOSIN (FLOMAX) 0.4 MG CAPS CAPSULE    0.8 mg. Via tube daily. *Swallow whole* *Do not crush, chew, or open capsules*   TOPIRAMATE (TOPAMAX) 100 MG TABLET    Take 1 tablet (100 mg total) by mouth 2 (two) times daily. PEG tube   TRAZODONE (DESYREL) 100 MG TABLET    200 mg by PEG Tube route at bedtime.    TRAZODONE (DESYREL) 50 MG TABLET    Take 25 mg by mouth 2 (two) times daily as needed for sleep (FOR ANXIETY).   VENLAFAXINE (EFFEXOR) 100 MG TABLET    100 mg by PEG Tube route 2 (two) times daily.   Modified Medications   No medications on file  Discontinued Medications   SODIUM PHOSPHATE (FLEET) ENEMA    Place 1 enema rectally daily as needed. follow package directions     Review of Systems  Constitutional: Negative for fever and chills.  HENT: Negative for congestion, dental problem, ear pain, facial swelling, hearing loss, sore throat, tinnitus and trouble swallowing.    Eyes: Negative.   Respiratory: Positive for cough. Negative for shortness of breath.        Clear sputum  Cardiovascular: Negative for leg swelling.  Gastrointestinal: Negative for abdominal pain and blood in stool.       PEG feeder  Genitourinary: Positive for dysuria, urgency and frequency. Negative for hematuria and flank pain.       BPH. Tender both testicles since 08/06/2015. Worse on the right side. Foul-smelling urine.  Musculoskeletal: Positive for back pain and neck pain.  Skin: Negative for rash.       Feeling raw between the legs  Neurological: Positive for weakness.       Expressive aphasia  Psychiatric/Behavioral: Positive for sleep disturbance.    Filed Vitals:   11/18/15 1317  BP: 134/70  Pulse: 74  Temp: 98.6 F (37 C)  Resp: 17  Height: 5' 9" (1.753 m)  Weight: 173 lb (78.472 kg)  SpO2: 95%   Wt Readings from Last 3 Encounters:  11/18/15 173 lb (78.472 kg)  11/12/15 170 lb (77.111 kg)  11/10/15 173 lb (78.472 kg)    Body mass index is 25.54 kg/(m^2).  Physical Exam  Constitutional: No distress.  HENT:  Mouth/Throat: Oropharynx is clear and moist.  Eyes: EOM are normal. Pupils are equal, round, and reactive to light. Right eye exhibits no discharge. Left eye exhibits no discharge. No scleral icterus.  Neck: No JVD present. No tracheal deviation present. No thyromegaly present.  Cardiovascular: Normal rate, regular rhythm, normal heart sounds and intact distal pulses.   No murmur heard. Pulmonary/Chest: Effort normal. No stridor. No respiratory distress. He has no wheezes. He has rales.  Abdominal: Soft. Bowel sounds are normal. He exhibits no distension and no mass. There is no tenderness. There is no guarding. No hernia.  PEG tube in place.   Genitourinary: Penis normal. No penile tenderness.  Musculoskeletal: He exhibits no edema or tenderness.  Lymphadenopathy:    He has no cervical adenopathy.  Neurological: He displays normal reflexes. No  cranial nerve deficit. He exhibits normal muscle tone. Coordination normal.  Expressive aphasia  Skin: Skin is warm and dry. Rash (Erythematous area in both groins and on the thighs) noted. No erythema. No pallor.  Psychiatric: He has a normal mood and affect. His behavior is normal.     Labs reviewed: Lab Summary Latest Ref Rng 11/12/2015 08/07/2015 07/22/2015  Hemoglobin 13.0 - 17.0 g/dL 14.0 13.5 13.9  Hematocrit 39.0 -  52.0 % 42.7 42.0 43.4  White count 4.0 - 10.5 K/uL 5.9 6.6 10.0  Platelet count 150 - 400 K/uL 140(L) 150 216  Sodium 135 - 145 mmol/L 136 142 140  Potassium 3.5 - 5.1 mmol/L 4.0 3.6 5.1  Calcium 8.9 - 10.3 mg/dL 9.1 9.1 9.2  Phosphorus - (None) (None) (None)  Creatinine 0.61 - 1.24 mg/dL 0.49(L) 0.53(L) 0.68(L)  AST 0 - 40 IU/L (None) (None) 52(H)  Alk Phos 39 - 117 IU/L (None) (None) 114  Bilirubin 0.0 - 1.2 mg/dL (None) (None) <0.2  Glucose 65 - 99 mg/dL 88 95 87  Cholesterol - (None) (None) (None)  HDL cholesterol - (None) (None) (None)  Triglycerides - (None) (None) (None)  LDL Direct - (None) (None) (None)  LDL Calc - (None) (None) (None)  Total protein - (None) (None) (None)  Albumin 3.5 - 5.5 g/dL (None) (None) 4.1   Lab Results  Component Value Date   TSH 1.52 09/01/2015   Lab Results  Component Value Date   BUN 18 11/12/2015   BUN 22* 08/07/2015   BUN 22 07/22/2015   Lab Results  Component Value Date   CREATININE 0.49* 11/12/2015   CREATININE 0.53* 08/07/2015   CREATININE 0.68* 07/22/2015   Lab Results  Component Value Date   HGBA1C 5.2 10/11/2013       Assessment/Plan  1. Dermatitis - clotrimazole-betamethasone (LOTRISONE) cream; Apply sparingly twice daily to rash  Dispense: 30 g; Refill: 0  2. Otalgia of left ear resolved

## 2015-11-22 ENCOUNTER — Other Ambulatory Visit: Payer: Self-pay | Admitting: *Deleted

## 2015-11-22 MED ORDER — OXYCODONE HCL 10 MG PO TABS: ORAL_TABLET | ORAL | Status: DC

## 2015-11-22 NOTE — Telephone Encounter (Signed)
[name] 

## 2015-12-24 ENCOUNTER — Emergency Department (HOSPITAL_COMMUNITY): Payer: Medicare HMO

## 2015-12-24 ENCOUNTER — Encounter (HOSPITAL_COMMUNITY): Payer: Self-pay | Admitting: Emergency Medicine

## 2015-12-24 ENCOUNTER — Emergency Department (HOSPITAL_COMMUNITY)
Admission: EM | Admit: 2015-12-24 | Discharge: 2015-12-24 | Disposition: A | Discharge status: Home / Self Care | Payer: Medicare HMO | Attending: Emergency Medicine | Admitting: Emergency Medicine

## 2015-12-24 DIAGNOSIS — W2209XA Striking against other stationary object, initial encounter: Secondary | ICD-10-CM | POA: Insufficient documentation

## 2015-12-24 DIAGNOSIS — Z8673 Personal history of transient ischemic attack (TIA), and cerebral infarction without residual deficits: Secondary | ICD-10-CM | POA: Diagnosis not present

## 2015-12-24 DIAGNOSIS — Z87891 Personal history of nicotine dependence: Secondary | ICD-10-CM | POA: Insufficient documentation

## 2015-12-24 DIAGNOSIS — J189 Pneumonia, unspecified organism: Secondary | ICD-10-CM | POA: Diagnosis not present

## 2015-12-24 DIAGNOSIS — I10 Essential (primary) hypertension: Secondary | ICD-10-CM | POA: Diagnosis not present

## 2015-12-24 DIAGNOSIS — S0990XA Unspecified injury of head, initial encounter: Secondary | ICD-10-CM | POA: Diagnosis not present

## 2015-12-24 DIAGNOSIS — W19XXXA Unspecified fall, initial encounter: Secondary | ICD-10-CM

## 2015-12-24 DIAGNOSIS — Y929 Unspecified place or not applicable: Secondary | ICD-10-CM | POA: Insufficient documentation

## 2015-12-24 DIAGNOSIS — E1140 Type 2 diabetes mellitus with diabetic neuropathy, unspecified: Secondary | ICD-10-CM | POA: Diagnosis not present

## 2015-12-24 DIAGNOSIS — Y939 Activity, unspecified: Secondary | ICD-10-CM | POA: Insufficient documentation

## 2015-12-24 DIAGNOSIS — Y999 Unspecified external cause status: Secondary | ICD-10-CM | POA: Diagnosis not present

## 2015-12-24 LAB — BASIC METABOLIC PANEL
ANION GAP: 5 (ref 5–15)
BUN: 15 mg/dL (ref 6–20)
CALCIUM: 9.1 mg/dL (ref 8.9–10.3)
CHLORIDE: 107 mmol/L (ref 101–111)
CO2: 27 mmol/L (ref 22–32)
Creatinine, Ser: 0.59 mg/dL — ABNORMAL LOW (ref 0.61–1.24)
GFR calc non Af Amer: >60 mL/min (ref >60)
Glucose, Bld: 96 mg/dL (ref 65–99)
Potassium: 3.4 mmol/L — ABNORMAL LOW (ref 3.5–5.1)
SODIUM: 139 mmol/L (ref 135–145)

## 2015-12-24 LAB — CBC WITH DIFFERENTIAL/PLATELET
BASOS ABS: 0 10*3/uL (ref 0.0–0.1)
BASOS PCT: 0 %
EOS ABS: 0.3 10*3/uL (ref 0.0–0.7)
Eosinophils Relative: 5 %
HEMATOCRIT: 41.3 % (ref 39.0–52.0)
HEMOGLOBIN: 13.1 g/dL (ref 13.0–17.0)
Lymphocytes Relative: 21 %
Lymphs Abs: 1.4 10*3/uL (ref 0.7–4.0)
MCH: 30.8 pg (ref 26.0–34.0)
MCHC: 31.7 g/dL (ref 30.0–36.0)
MCV: 97.2 fL (ref 78.0–100.0)
Monocytes Absolute: 0.5 10*3/uL (ref 0.1–1.0)
Monocytes Relative: 8 %
Neutro Abs: 4.3 10*3/uL (ref 1.7–7.7)
Neutrophils Relative %: 66 %
Platelets: 132 10*3/uL — ABNORMAL LOW (ref 150–400)
RBC: 4.25 MIL/uL (ref 4.22–5.81)
RDW: 13 % (ref 11.5–15.5)
WBC: 6.5 10*3/uL (ref 4.0–10.5)

## 2015-12-24 MED ORDER — ACETAMINOPHEN 500 MG PO TABS
1000.0000 mg | ORAL_TABLET | Freq: Once | ORAL | Status: AC
  Administered 2015-12-24: 1000 mg via ORAL
  Filled 2015-12-24: qty 2

## 2015-12-24 MED ORDER — LEVOFLOXACIN 750 MG PO TABS: 750.0000 mg | ORAL_TABLET | Freq: Every day | ORAL | Status: DC

## 2015-12-24 NOTE — ED Notes (Signed)
Ems states pt fell Tuesday hitting the back of his head. Pt just told facility today that he fell. Pt c/o head pain. Pt was bent over and stood up hitting his head
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
## 2015-12-24 NOTE — ED Notes (Signed)
Pt waiting on ptar 

## 2015-12-24 NOTE — Discharge Instructions (Signed)
If you develop fevers, breathing difficulty or new concerns return to the ER.  If you were given medicines take as directed.  If you are on coumadin or contraceptives realize their levels and effectiveness is altered by many different medicines.  If you have any reaction (rash, tongues swelling, other) to the medicines stop taking and see a physician.    If your blood pressure was elevated in the ER make sure you follow up for management with a primary doctor or return for chest pain, shortness of breath or stroke symptoms.  Please follow up as directed and return to the ER or see a physician for new or worsening symptoms.  Thank you. Filed Vitals:   12/24/15 1530 12/24/15 1545 12/24/15 1745 12/24/15 1800  BP: 118/94 130/81 113/84 110/78  Pulse: 73 78 58 55  Temp:      TempSrc:      Resp: 20 22 20 22   Height:      Weight:      SpO2: 98% 95% 100% 100%

## 2016-02-05 ENCOUNTER — Encounter (HOSPITAL_COMMUNITY): Payer: Self-pay | Admitting: Emergency Medicine

## 2016-02-05 ENCOUNTER — Emergency Department (HOSPITAL_COMMUNITY)
Admission: EM | Admit: 2016-02-05 | Discharge: 2016-02-06 | Disposition: A | Discharge status: Home / Self Care | Payer: Medicare HMO | Attending: Emergency Medicine | Admitting: Emergency Medicine

## 2016-02-05 ENCOUNTER — Emergency Department (HOSPITAL_COMMUNITY): Payer: Medicare HMO

## 2016-02-05 DIAGNOSIS — Z7951 Long term (current) use of inhaled steroids: Secondary | ICD-10-CM | POA: Insufficient documentation

## 2016-02-05 DIAGNOSIS — E119 Type 2 diabetes mellitus without complications: Secondary | ICD-10-CM | POA: Diagnosis not present

## 2016-02-05 DIAGNOSIS — R1031 Right lower quadrant pain: Secondary | ICD-10-CM | POA: Insufficient documentation

## 2016-02-05 DIAGNOSIS — Z79899 Other long term (current) drug therapy: Secondary | ICD-10-CM | POA: Insufficient documentation

## 2016-02-05 DIAGNOSIS — I10 Essential (primary) hypertension: Secondary | ICD-10-CM | POA: Insufficient documentation

## 2016-02-05 DIAGNOSIS — Z87891 Personal history of nicotine dependence: Secondary | ICD-10-CM | POA: Insufficient documentation

## 2016-02-05 DIAGNOSIS — R109 Unspecified abdominal pain: Secondary | ICD-10-CM

## 2016-02-05 DIAGNOSIS — Z791 Long term (current) use of non-steroidal anti-inflammatories (NSAID): Secondary | ICD-10-CM | POA: Insufficient documentation

## 2016-02-05 LAB — COMPREHENSIVE METABOLIC PANEL
ALBUMIN: 3.9 g/dL (ref 3.5–5.0)
ALK PHOS: 113 U/L (ref 38–126)
ALT: 31 U/L (ref 17–63)
ANION GAP: 5 (ref 5–15)
AST: 20 U/L (ref 15–41)
BUN: 11 mg/dL (ref 6–20)
CALCIUM: 8.8 mg/dL — AB (ref 8.9–10.3)
CO2: 27 mmol/L (ref 22–32)
CREATININE: 0.47 mg/dL — AB (ref 0.61–1.24)
Chloride: 101 mmol/L (ref 101–111)
GFR calc Af Amer: >60 mL/min (ref >60)
GFR calc non Af Amer: >60 mL/min (ref >60)
Glucose, Bld: 90 mg/dL (ref 65–99)
Potassium: 3.5 mmol/L (ref 3.5–5.1)
SODIUM: 133 mmol/L — AB (ref 135–145)
TOTAL PROTEIN: 6.5 g/dL (ref 6.5–8.1)
Total Bilirubin: 0.4 mg/dL (ref 0.3–1.2)

## 2016-02-05 LAB — CBC WITH DIFFERENTIAL/PLATELET
BASOS PCT: 0 %
Basophils Absolute: 0 10*3/uL (ref 0.0–0.1)
EOS ABS: 0.4 10*3/uL (ref 0.0–0.7)
Eosinophils Relative: 6 %
HCT: 42 % (ref 39.0–52.0)
HEMOGLOBIN: 13.6 g/dL (ref 13.0–17.0)
Lymphocytes Relative: 27 %
Lymphs Abs: 1.5 10*3/uL (ref 0.7–4.0)
MCH: 31.3 pg (ref 26.0–34.0)
MCHC: 32.4 g/dL (ref 30.0–36.0)
MCV: 96.6 fL (ref 78.0–100.0)
MONOS PCT: 7 %
Monocytes Absolute: 0.4 10*3/uL (ref 0.1–1.0)
NEUTROS PCT: 60 %
Neutro Abs: 3.5 10*3/uL (ref 1.7–7.7)
PLATELETS: 135 10*3/uL — AB (ref 150–400)
RBC: 4.35 MIL/uL (ref 4.22–5.81)
RDW: 12.6 % (ref 11.5–15.5)
WBC: 5.8 10*3/uL (ref 4.0–10.5)

## 2016-02-05 LAB — URINALYSIS, ROUTINE W REFLEX MICROSCOPIC
BILIRUBIN URINE: NEGATIVE
Glucose, UA: NEGATIVE mg/dL
Hgb urine dipstick: NEGATIVE
KETONES UR: NEGATIVE mg/dL
Leukocytes, UA: NEGATIVE
NITRITE: NEGATIVE
Protein, ur: NEGATIVE mg/dL
Specific Gravity, Urine: 1.011 (ref 1.005–1.030)
pH: 8 (ref 5.0–8.0)

## 2016-02-05 LAB — LIPASE, BLOOD: LIPASE: 20 U/L (ref 11–51)

## 2016-02-05 MED ORDER — POLYETHYLENE GLYCOL 3350 17 G PO PACK: 17.0000 g | PACK | Freq: Every day | ORAL | 0 refills | Status: DC

## 2016-02-05 MED ORDER — BISACODYL 10 MG RE SUPP: 10.0000 mg | RECTAL | 1 refills | Status: DC | PRN

## 2016-02-05 MED ORDER — IOPAMIDOL (ISOVUE-300) INJECTION 61%
100.0000 mL | Freq: Once | INTRAVENOUS | Status: AC | PRN
  Administered 2016-02-05: 100 mL via INTRAVENOUS

## 2016-02-05 MED ORDER — OXYCODONE-ACETAMINOPHEN 5-325 MG PO TABS
1.0000 | ORAL_TABLET | Freq: Once | ORAL | Status: AC
  Administered 2016-02-05: 1 via ORAL
  Filled 2016-02-05: qty 1

## 2016-02-05 NOTE — ED Notes (Signed)
Patient attempting to urinate at this time.

## 2016-02-05 NOTE — ED Notes (Signed)
Called report to Windell Moulding at [facility] [phone].

## 2016-02-05 NOTE — Discharge Instructions (Signed)
Take the Miralax every night and the dulcolax suppository as need for constipation. Follow up with your primary care provider in 2 days to be reevaluated.  Return to the emergency department if you experience worsening side/abdominal pain, constipation, diarrhea, fevers, or any other concerning symptoms.
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
## 2016-02-05 NOTE — ED Notes (Signed)
Patient has a peg tube. Peg tube used to give medication.

## 2016-02-05 NOTE — ED Provider Notes (Signed)
Pt care signed out to me at change of shift by Mayme Genta, PA-C  Pt aphasic at baseline with TBI, peg tube complaining of mild flank pain and RLQ abd pain for the past day. Pending basic labs and CT abd/pelvis with contrast to r/u appendicitis. No F, V, N, D.  If no new objective findings d/c home with PCP f/u in 2 days.  Ct without acute abnormalities.  IMPRESSION: Large amount of retained large bowel stool, no bowel obstruction. Bilateral nonobstructing nephrolithiasis ; interval passage of LEFT upper pole nephrolithiasis present on prior CT.  Patient is nontoxic, nonseptic appearing, in no apparent distress.  Patient's pain and other symptoms adequately managed in emergency department.  Labs, imaging and vitals reviewed.  Patient does not meet the SIRS or Sepsis criteria.  On repeat exam patient does not have a surgical abdomin and there are no peritoneal signs.  No indication of appendicitis, bowel obstruction, bowel perforation, cholecystitis, diverticulitis.    Pt's pain managed prior to discharge and pt stable at time of discharge. Instructed pt to f/u with PCP on Monday to be reevaluated. Instructed pt to take miralax daily for constipation. Discussed strict return precautions. Pt will be discharge   Pt case discussed at pt seen by Dr. Dalene Seltzer who agrees with the above plan.       Jerre Simon, PA 02/06/16 0023    Alvira Monday, MD 02/08/16 0140

## 2016-02-05 NOTE — ED Triage Notes (Signed)
Patient is complaining of right flank pain x 1day. Patient is aphasic for his baseline. He has had decrease output. Aspiration precautions

## 2016-02-06 DIAGNOSIS — R1031 Right lower quadrant pain: Secondary | ICD-10-CM | POA: Diagnosis not present

## 2016-02-06 MED ORDER — KETOROLAC TROMETHAMINE 30 MG/ML IJ SOLN
30.0000 mg | Freq: Once | INTRAMUSCULAR | Status: AC
  Administered 2016-02-06: 30 mg via INTRAVENOUS
  Filled 2016-02-06: qty 1

## 2016-02-06 NOTE — ED Notes (Signed)
No respiratory or acute distress noted resting in bed with eyes closed call light in reach no reaction to medication noted. 

## 2016-02-06 NOTE — ED Notes (Signed)
No reaction to medication noted alert and looking around room no respiratory or acute distress noted left with PTAR back to [facility].

## 2016-03-15 ENCOUNTER — Encounter (HOSPITAL_COMMUNITY): Payer: Self-pay

## 2016-03-15 ENCOUNTER — Emergency Department (HOSPITAL_COMMUNITY): Payer: Medicare HMO

## 2016-03-15 ENCOUNTER — Emergency Department (HOSPITAL_COMMUNITY)
Admission: EM | Admit: 2016-03-15 | Discharge: 2016-03-15 | Disposition: A | Discharge status: Home / Self Care | Payer: Medicare HMO | Attending: Emergency Medicine | Admitting: Emergency Medicine

## 2016-03-15 DIAGNOSIS — N50812 Left testicular pain: Secondary | ICD-10-CM | POA: Diagnosis present

## 2016-03-15 DIAGNOSIS — Z87891 Personal history of nicotine dependence: Secondary | ICD-10-CM | POA: Insufficient documentation

## 2016-03-15 DIAGNOSIS — E119 Type 2 diabetes mellitus without complications: Secondary | ICD-10-CM | POA: Insufficient documentation

## 2016-03-15 DIAGNOSIS — I10 Essential (primary) hypertension: Secondary | ICD-10-CM | POA: Insufficient documentation

## 2016-03-15 DIAGNOSIS — Z79899 Other long term (current) drug therapy: Secondary | ICD-10-CM | POA: Diagnosis not present

## 2016-03-15 DIAGNOSIS — N50819 Testicular pain, unspecified: Secondary | ICD-10-CM

## 2016-03-15 LAB — I-STAT CHEM 8, ED
BUN: 12 mg/dL (ref 6–20)
CHLORIDE: 100 mmol/L — AB (ref 101–111)
Calcium, Ion: 1.08 mmol/L — ABNORMAL LOW (ref 1.15–1.40)
Creatinine, Ser: 0.5 mg/dL — ABNORMAL LOW (ref 0.61–1.24)
Glucose, Bld: 97 mg/dL (ref 65–99)
HEMATOCRIT: 44 % (ref 39.0–52.0)
HEMOGLOBIN: 15 g/dL (ref 13.0–17.0)
POTASSIUM: 3.9 mmol/L (ref 3.5–5.1)
Sodium: 136 mmol/L (ref 135–145)
TCO2: 25 mmol/L (ref 0–100)

## 2016-03-15 LAB — URINALYSIS, ROUTINE W REFLEX MICROSCOPIC
Bilirubin Urine: NEGATIVE
Glucose, UA: NEGATIVE mg/dL
Hgb urine dipstick: NEGATIVE
KETONES UR: NEGATIVE mg/dL
LEUKOCYTES UA: NEGATIVE
NITRITE: NEGATIVE
PROTEIN: NEGATIVE mg/dL
Specific Gravity, Urine: 1.011 (ref 1.005–1.030)
pH: 7.5 (ref 5.0–8.0)

## 2016-03-15 LAB — CBC WITH DIFFERENTIAL/PLATELET
BASOS ABS: 0 10*3/uL (ref 0.0–0.1)
Basophils Relative: 0 %
EOS ABS: 0.2 10*3/uL (ref 0.0–0.7)
EOS PCT: 3 %
HCT: 41.8 % (ref 39.0–52.0)
HEMOGLOBIN: 14 g/dL (ref 13.0–17.0)
LYMPHS PCT: 16 %
Lymphs Abs: 0.9 10*3/uL (ref 0.7–4.0)
MCH: 31.3 pg (ref 26.0–34.0)
MCHC: 33.5 g/dL (ref 30.0–36.0)
MCV: 93.5 fL (ref 78.0–100.0)
Monocytes Absolute: 0.3 10*3/uL (ref 0.1–1.0)
Monocytes Relative: 6 %
NEUTROS PCT: 75 %
Neutro Abs: 4.3 10*3/uL (ref 1.7–7.7)
PLATELETS: 163 10*3/uL (ref 150–400)
RBC: 4.47 MIL/uL (ref 4.22–5.81)
RDW: 12.7 % (ref 11.5–15.5)
WBC: 5.8 10*3/uL (ref 4.0–10.5)

## 2016-03-15 MED ORDER — OXYCODONE HCL 5 MG/5ML PO SOLN
10.0000 mg | Freq: Once | ORAL | Status: AC
  Administered 2016-03-15: 10 mg via ORAL
  Filled 2016-03-15: qty 10

## 2016-03-15 MED ORDER — FENTANYL CITRATE (PF) 100 MCG/2ML IJ SOLN
100.0000 ug | Freq: Once | INTRAMUSCULAR | Status: AC
  Administered 2016-03-15: 100 ug via INTRAVENOUS
  Filled 2016-03-15: qty 2

## 2016-03-15 NOTE — Discharge Instructions (Signed)
Follow-up with Urology for continued pain. He had a possible cyst on his epididymis and a possible cyst in his left inguinal canal.

## 2016-03-15 NOTE — ED Notes (Signed)
More tolerant to stimulus to testicular area. US in progress

## 2016-03-15 NOTE — ED Triage Notes (Signed)
Per [facility]- Pt resides at [facility]. FULL CODE. Staff believes pt went on exertion Friday in wheelchair, wore tight jeans, believed this may be source. Urination has been normal, presentation of left testicle normal however pt's response to any palpation (abnormal to his baseline). Pt communicates via note pad. MD at nursing home evaluated pt (requesting US of testicle) and has referred pt for evaluation to ED. No evidence of N/V/D or fever.

## 2016-03-15 NOTE — ED Notes (Signed)
MD at bedside. EDP PICKERING PRESENT TO EVALUATE

## 2016-03-15 NOTE — ED Notes (Signed)
MD at bedside. EDP PICKERING PRESENT TO UPDATE PT
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
## 2016-03-15 NOTE — ED Notes (Signed)
NO IN AND OUT

## 2016-03-17 ENCOUNTER — Other Ambulatory Visit (HOSPITAL_COMMUNITY): Payer: Self-pay | Admitting: Internal Medicine

## 2016-03-17 DIAGNOSIS — R1310 Dysphagia, unspecified: Secondary | ICD-10-CM

## 2016-03-23 ENCOUNTER — Ambulatory Visit: Payer: Medicare HMO | Admitting: Neurology

## 2016-03-29 ENCOUNTER — Ambulatory Visit (HOSPITAL_COMMUNITY)
Admission: RE | Admit: 2016-03-29 | Discharge: 2016-03-29 | Disposition: A | Discharge status: Home / Self Care | Payer: Medicare HMO | Source: Ambulatory Visit | Attending: Internal Medicine | Admitting: Internal Medicine

## 2016-03-29 DIAGNOSIS — R1313 Dysphagia, pharyngeal phase: Secondary | ICD-10-CM | POA: Insufficient documentation

## 2016-03-29 DIAGNOSIS — R1310 Dysphagia, unspecified: Secondary | ICD-10-CM

## 2016-03-29 DIAGNOSIS — R1311 Dysphagia, oral phase: Secondary | ICD-10-CM | POA: Insufficient documentation

## 2016-03-29 NOTE — Progress Notes (Signed)
MBSS complete. Full report located under chart review in imaging section.  Nashonda Limberg Paiewonsky, M.A. CCC-SLP [phone]  

## 2016-04-17 ENCOUNTER — Other Ambulatory Visit: Payer: Self-pay | Admitting: *Deleted

## 2016-04-17 MED ORDER — LACOSAMIDE 100 MG PO TABS: ORAL_TABLET | ORAL | 0 refills | Status: DC

## 2016-04-17 NOTE — Telephone Encounter (Signed)
Alexander [name],  Disregard Rx. No longer go to [facility]. Rx shredded and request faxed back to facility.

## 2016-04-28 ENCOUNTER — Emergency Department (HOSPITAL_COMMUNITY)
Admission: EM | Admit: 2016-04-28 | Discharge: 2016-05-01 | Disposition: A | Discharge status: Home / Self Care | Payer: Medicare HMO | Attending: Emergency Medicine | Admitting: Emergency Medicine

## 2016-04-28 ENCOUNTER — Encounter (HOSPITAL_COMMUNITY): Payer: Self-pay | Admitting: Emergency Medicine

## 2016-04-28 ENCOUNTER — Emergency Department (HOSPITAL_COMMUNITY): Payer: Medicare HMO

## 2016-04-28 DIAGNOSIS — F322 Major depressive disorder, single episode, severe without psychotic features: Secondary | ICD-10-CM | POA: Diagnosis not present

## 2016-04-28 DIAGNOSIS — I10 Essential (primary) hypertension: Secondary | ICD-10-CM | POA: Insufficient documentation

## 2016-04-28 DIAGNOSIS — Y999 Unspecified external cause status: Secondary | ICD-10-CM | POA: Diagnosis not present

## 2016-04-28 DIAGNOSIS — Y929 Unspecified place or not applicable: Secondary | ICD-10-CM | POA: Insufficient documentation

## 2016-04-28 DIAGNOSIS — Z87891 Personal history of nicotine dependence: Secondary | ICD-10-CM | POA: Diagnosis not present

## 2016-04-28 DIAGNOSIS — G231 Progressive supranuclear ophthalmoplegia [Steele-Richardson-Olszewski]: Secondary | ICD-10-CM | POA: Diagnosis present

## 2016-04-28 DIAGNOSIS — Z79899 Other long term (current) drug therapy: Secondary | ICD-10-CM | POA: Insufficient documentation

## 2016-04-28 DIAGNOSIS — F332 Major depressive disorder, recurrent severe without psychotic features: Secondary | ICD-10-CM | POA: Diagnosis not present

## 2016-04-28 DIAGNOSIS — Z808 Family history of malignant neoplasm of other organs or systems: Secondary | ICD-10-CM | POA: Diagnosis not present

## 2016-04-28 DIAGNOSIS — Z888 Allergy status to other drugs, medicaments and biological substances status: Secondary | ICD-10-CM | POA: Diagnosis not present

## 2016-04-28 DIAGNOSIS — Y939 Activity, unspecified: Secondary | ICD-10-CM | POA: Diagnosis not present

## 2016-04-28 DIAGNOSIS — T1491XA Suicide attempt, initial encounter: Secondary | ICD-10-CM

## 2016-04-28 DIAGNOSIS — X788XXA Intentional self-harm by other sharp object, initial encounter: Secondary | ICD-10-CM | POA: Insufficient documentation

## 2016-04-28 DIAGNOSIS — Z0000 Encounter for general adult medical examination without abnormal findings: Secondary | ICD-10-CM

## 2016-04-28 LAB — COMPREHENSIVE METABOLIC PANEL
ALK PHOS: 110 U/L (ref 38–126)
ALT: 49 U/L (ref 17–63)
ANION GAP: 5 (ref 5–15)
AST: 44 U/L — ABNORMAL HIGH (ref 15–41)
Albumin: 4.3 g/dL (ref 3.5–5.0)
BILIRUBIN TOTAL: 0.5 mg/dL (ref 0.3–1.2)
BUN: 14 mg/dL (ref 6–20)
CALCIUM: 9.8 mg/dL (ref 8.9–10.3)
CO2: 30 mmol/L (ref 22–32)
Chloride: 104 mmol/L (ref 101–111)
Creatinine, Ser: 0.3 mg/dL — ABNORMAL LOW (ref 0.61–1.24)
GLUCOSE: 92 mg/dL (ref 65–99)
POTASSIUM: 3.5 mmol/L (ref 3.5–5.1)
Sodium: 139 mmol/L (ref 135–145)
TOTAL PROTEIN: 7.6 g/dL (ref 6.5–8.1)

## 2016-04-28 LAB — CBC
HEMATOCRIT: 44.5 % (ref 39.0–52.0)
Hemoglobin: 14.5 g/dL (ref 13.0–17.0)
MCH: 31.7 pg (ref 26.0–34.0)
MCHC: 32.6 g/dL (ref 30.0–36.0)
MCV: 97.2 fL (ref 78.0–100.0)
PLATELETS: 176 10*3/uL (ref 150–400)
RBC: 4.58 MIL/uL (ref 4.22–5.81)
RDW: 13.4 % (ref 11.5–15.5)
WBC: 7.1 10*3/uL (ref 4.0–10.5)

## 2016-04-28 LAB — RAPID URINE DRUG SCREEN, HOSP PERFORMED
Amphetamines: NOT DETECTED
BARBITURATES: NOT DETECTED
Benzodiazepines: NOT DETECTED
COCAINE: NOT DETECTED
Opiates: NOT DETECTED
Tetrahydrocannabinol: NOT DETECTED

## 2016-04-28 LAB — SALICYLATE LEVEL: Salicylate Lvl: <7 mg/dL (ref 2.8–30.0)

## 2016-04-28 LAB — ACETAMINOPHEN LEVEL

## 2016-04-28 LAB — ETHANOL

## 2016-04-28 MED ORDER — OXYCODONE HCL 5 MG PO TABS
10.0000 mg | ORAL_TABLET | ORAL | Status: DC | PRN
  Administered 2016-04-28 – 2016-05-01 (×15): 10 mg
  Filled 2016-04-28 (×15): qty 2

## 2016-04-28 MED ORDER — BISACODYL 10 MG RE SUPP
10.0000 mg | RECTAL | Status: DC | PRN
  Filled 2016-04-28: qty 1

## 2016-04-28 MED ORDER — OXCARBAZEPINE 300 MG PO TABS
600.0000 mg | ORAL_TABLET | Freq: Every day | ORAL | Status: DC
  Administered 2016-04-28 – 2016-04-30 (×3): 600 mg
  Filled 2016-04-28 (×3): qty 2

## 2016-04-28 MED ORDER — JEVITY 1.5 CAL/FIBER PO LIQD
105.0000 mL/h | ORAL | Status: DC
  Administered 2016-04-28 – 2016-04-30 (×4): 105 mL/h
  Filled 2016-04-28 (×4): qty 1000

## 2016-04-28 MED ORDER — TETANUS-DIPHTH-ACELL PERTUSSIS 5-2.5-18.5 LF-MCG/0.5 IM SUSP
0.5000 mL | Freq: Once | INTRAMUSCULAR | Status: AC
  Administered 2016-04-28: 0.5 mL via INTRAMUSCULAR
  Filled 2016-04-28: qty 0.5

## 2016-04-28 MED ORDER — GABAPENTIN 400 MG PO CAPS
1500.0000 mg | ORAL_CAPSULE | Freq: Every day | ORAL | Status: DC
  Administered 2016-04-29 – 2016-05-01 (×3): 1500 mg
  Filled 2016-04-28 (×2): qty 3
  Filled 2016-04-28: qty 1

## 2016-04-28 MED ORDER — TRAZODONE HCL 100 MG PO TABS
200.0000 mg | ORAL_TABLET | Freq: Every day | ORAL | Status: DC
  Administered 2016-04-28 – 2016-04-30 (×3): 200 mg
  Filled 2016-04-28 (×3): qty 2

## 2016-04-28 MED ORDER — PANTOPRAZOLE SODIUM 40 MG PO PACK
40.0000 mg | PACK | Freq: Every day | ORAL | Status: DC
  Administered 2016-04-29 – 2016-05-01 (×3): 40 mg via ORAL
  Filled 2016-04-28 (×5): qty 20

## 2016-04-28 MED ORDER — FLUTICASONE PROPIONATE 50 MCG/ACT NA SUSP
1.0000 | Freq: Every day | NASAL | Status: DC
  Administered 2016-04-28 – 2016-04-30 (×3): 1 via NASAL
  Filled 2016-04-28 (×2): qty 16

## 2016-04-28 MED ORDER — MELATONIN 3 MG PO TABS: 3.0000 mg | ORAL_TABLET | Freq: Every day | ORAL | Status: DC

## 2016-04-28 MED ORDER — IPRATROPIUM-ALBUTEROL 0.5-2.5 (3) MG/3ML IN SOLN
3.0000 mL | RESPIRATORY_TRACT | Status: DC
  Administered 2016-04-29: 3 mL via RESPIRATORY_TRACT
  Filled 2016-04-28: qty 3

## 2016-04-28 MED ORDER — ACETAMINOPHEN 500 MG PO TABS
1000.0000 mg | ORAL_TABLET | Freq: Three times a day (TID) | ORAL | Status: DC
  Administered 2016-04-28 – 2016-05-01 (×7): 1000 mg
  Filled 2016-04-28 (×7): qty 2

## 2016-04-28 MED ORDER — TOPIRAMATE 100 MG PO TABS
100.0000 mg | ORAL_TABLET | Freq: Two times a day (BID) | ORAL | Status: DC
  Administered 2016-04-28 – 2016-05-01 (×6): 100 mg
  Filled 2016-04-28 (×6): qty 1

## 2016-04-28 MED ORDER — POLYVINYL ALCOHOL 1.4 % OP SOLN
1.0000 [drp] | Freq: Every day | OPHTHALMIC | Status: DC
Start: 2016-04-29 — End: 2016-05-02
  Administered 2016-04-29 – 2016-05-01 (×3): 1 [drp] via OPHTHALMIC
  Filled 2016-04-28: qty 15

## 2016-04-28 MED ORDER — FENTANYL 25 MCG/HR TD PT72
25.0000 ug | MEDICATED_PATCH | TRANSDERMAL | Status: DC
  Administered 2016-04-30: 25 ug via TRANSDERMAL
  Filled 2016-04-28: qty 1

## 2016-04-28 MED ORDER — CALCIUM CARBONATE-VITAMIN D 500-200 MG-UNIT PO TABS
1.0000 | ORAL_TABLET | Freq: Every day | ORAL | Status: DC
  Administered 2016-04-29 – 2016-05-01 (×3): 1
  Filled 2016-04-28 (×3): qty 1

## 2016-04-28 MED ORDER — ALLOPURINOL 300 MG PO TABS
300.0000 mg | ORAL_TABLET | Freq: Every day | ORAL | Status: DC
  Administered 2016-04-29 – 2016-05-01 (×3): 300 mg
  Filled 2016-04-28 (×3): qty 1

## 2016-04-28 MED ORDER — DEXTROMETHORPHAN-GUAIFENESIN 10-100 MG/5ML PO LIQD
30.0000 mL | Freq: Three times a day (TID) | ORAL | Status: DC
  Administered 2016-04-28 – 2016-05-01 (×7): 30 mL
  Filled 2016-04-28 (×28): qty 30

## 2016-04-28 MED ORDER — VENLAFAXINE HCL 50 MG PO TABS
100.0000 mg | ORAL_TABLET | Freq: Two times a day (BID) | ORAL | Status: DC
  Administered 2016-04-28 – 2016-05-01 (×6): 100 mg
  Filled 2016-04-28 (×9): qty 2

## 2016-04-28 MED ORDER — TRAZODONE HCL 50 MG PO TABS
25.0000 mg | ORAL_TABLET | Freq: Two times a day (BID) | ORAL | Status: DC | PRN
  Administered 2016-05-01: 25 mg
  Filled 2016-04-28: qty 1

## 2016-04-28 MED ORDER — BACITRACIN 500 UNIT/GM EX OINT
TOPICAL_OINTMENT | Freq: Every day | CUTANEOUS | Status: AC
  Administered 2016-04-28: 1 via TOPICAL
  Administered 2016-04-29: 11:00:00 via TOPICAL
  Administered 2016-04-30 – 2016-05-01 (×2): 1 via TOPICAL
  Filled 2016-04-28 (×4): qty 1

## 2016-04-28 MED ORDER — TAMSULOSIN HCL 0.4 MG PO CAPS
1.2000 mg | ORAL_CAPSULE | Freq: Every day | ORAL | Status: DC
  Administered 2016-04-29 – 2016-05-01 (×3): 1.2 mg via ORAL
  Filled 2016-04-28 (×3): qty 3

## 2016-04-28 MED ORDER — ONDANSETRON HCL 4 MG PO TABS
4.0000 mg | ORAL_TABLET | Freq: Four times a day (QID) | ORAL | Status: DC | PRN
Start: 2016-04-28 — End: 2016-05-02

## 2016-04-28 MED ORDER — FREE WATER
250.0000 mL | Status: DC
  Administered 2016-04-28 – 2016-05-01 (×18): 250 mL

## 2016-04-28 MED ORDER — ADULT MULTIVITAMIN LIQUID CH
15.0000 mL | Freq: Every day | ORAL | Status: DC
  Administered 2016-04-29 – 2016-05-01 (×3): 15 mL
  Filled 2016-04-28 (×3): qty 15

## 2016-04-28 MED ORDER — POLYETHYLENE GLYCOL 3350 17 G PO PACK
17.0000 g | PACK | Freq: Every day | ORAL | Status: DC
  Administered 2016-04-29 – 2016-05-01 (×2): 17 g
  Filled 2016-04-28 (×3): qty 1

## 2016-04-28 NOTE — ED Notes (Signed)
MD at bedside. 

## 2016-04-28 NOTE — ED Triage Notes (Signed)
Patient presents to ED with Medics and [employer] [facility] with Suicide Attempt today.   Per staff LPN at facility the patient took a disposable razor blade to the left forearm. Patient states "I want to harm myself". When asked by the officer as to how it was communicated, staff reports that the patient does not talk he writes. Present at the bedside is a composition book. The witting is somewhat illegible.  Per [employer] officer the facility unsure if they want to IVC the patient.  Patient has a PEG tube. Patient has not received a feeding today.   Vitals stable with EMS. Bleeding is controlled and a bandage is present. Pt is calm at triage. Wheelchair bound and incontinent. Pt is able to communicate verbally with slow speech.   CBG 115 128/80 86 HR 97%ra
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
## 2016-04-28 NOTE — BH Assessment (Signed)
[facility] Assessment Progress Note   Case was staffed with Link SnufferHobson FNP and meets criteria for an inpatient Gero-psych admission as appropriate bed placement is investigated.
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
## 2016-04-28 NOTE — ED Notes (Signed)
Pt used call light to call to nurses station. Pt has written that he gets his oxycodone at 1pm. Pt was informed that the Dr. He just met would have to address all of the medications and is informed.   Also noted is a note (illegible in areas) Mariann Laster, I love you because you are yourself. When asked who Mariann Laster was pt would not answer. Pt nods "yes" to cutting himself because of Mariann Laster.

## 2016-04-28 NOTE — ED Notes (Signed)
Bed: [id number] Expected date: 04/28/16 Expected time: 12:21 PM Means of arrival: Ambulance Comments: SI

## 2016-04-28 NOTE — ED Notes (Signed)
Pharmacy called for medications. 

## 2016-04-28 NOTE — ED Notes (Signed)
Pt reports that he receives feedings at 6 pm daily. Also present on right chest is 25 mcg Fentanyl patch.

## 2016-04-28 NOTE — Progress Notes (Signed)
Link SnufferHobson FNP recommended inpatient Gero-psych treatment, on 10/27.  Referrals have been sent to [facility] following hospitals: [facility], [facility], [facility], [facility], [facility], [facility], [facility], and [facility].  [place] Abtsatia Circe Chilton, LCSWA Disposition staff 04/28/2016 10:34 PM

## 2016-04-28 NOTE — ED Notes (Signed)
Spoke with Amy from [facility]-to administer duoneb and instruct in IS

## 2016-04-29 ENCOUNTER — Emergency Department (HOSPITAL_COMMUNITY): Payer: Medicare HMO

## 2016-04-29 DIAGNOSIS — Z79891 Long term (current) use of opiate analgesic: Secondary | ICD-10-CM

## 2016-04-29 DIAGNOSIS — Z87891 Personal history of nicotine dependence: Secondary | ICD-10-CM | POA: Diagnosis not present

## 2016-04-29 DIAGNOSIS — Z808 Family history of malignant neoplasm of other organs or systems: Secondary | ICD-10-CM

## 2016-04-29 DIAGNOSIS — F332 Major depressive disorder, recurrent severe without psychotic features: Secondary | ICD-10-CM | POA: Diagnosis not present

## 2016-04-29 DIAGNOSIS — F322 Major depressive disorder, single episode, severe without psychotic features: Secondary | ICD-10-CM | POA: Diagnosis not present

## 2016-04-29 DIAGNOSIS — Z888 Allergy status to other drugs, medicaments and biological substances status: Secondary | ICD-10-CM

## 2016-04-29 DIAGNOSIS — Z79899 Other long term (current) drug therapy: Secondary | ICD-10-CM

## 2016-04-29 DIAGNOSIS — R45851 Suicidal ideations: Secondary | ICD-10-CM

## 2016-04-29 LAB — URINALYSIS, ROUTINE W REFLEX MICROSCOPIC
BILIRUBIN URINE: NEGATIVE
Glucose, UA: NEGATIVE mg/dL
HGB URINE DIPSTICK: NEGATIVE
KETONES UR: NEGATIVE mg/dL
Nitrite: NEGATIVE
PROTEIN: NEGATIVE mg/dL
Specific Gravity, Urine: 1.012 (ref 1.005–1.030)
pH: 8 (ref 5.0–8.0)

## 2016-04-29 LAB — URINE MICROSCOPIC-ADD ON: RBC / HPF: NONE SEEN RBC/hpf (ref 0–5)

## 2016-04-29 LAB — TSH: TSH: 0.631 u[IU]/mL (ref 0.350–4.500)

## 2016-04-29 MED ORDER — IPRATROPIUM-ALBUTEROL 0.5-2.5 (3) MG/3ML IN SOLN
3.0000 mL | Freq: Four times a day (QID) | RESPIRATORY_TRACT | Status: DC
  Administered 2016-04-29: 3 mL via RESPIRATORY_TRACT
  Filled 2016-04-29 (×2): qty 3

## 2016-04-29 MED ORDER — IPRATROPIUM-ALBUTEROL 0.5-2.5 (3) MG/3ML IN SOLN: 3.0000 mL | Freq: Four times a day (QID) | RESPIRATORY_TRACT | Status: DC | PRN

## 2016-04-29 NOTE — ED Notes (Signed)
Dr. Myrlene Brokeriegler coming to see patient regarding his urinalysis results.

## 2016-04-29 NOTE — BH Assessment (Addendum)
Contacted [facility] at [phone] to verify that patient was accepted if placed under IVC. He states that they [facility] not [facility] and the patient would have to pay out of pocket to be considered for treatment. He states that there are no beds available currently.   Davina PokeJoVea Carren Blakley, LCSW Therapeutic Triage Specialist [facility] 04/29/2016 8:58 AM

## 2016-04-29 NOTE — ED Notes (Signed)
Patient returned from xray.

## 2016-04-29 NOTE — ED Notes (Signed)
Patient c/o left shoulder pain he rates as an 8.

## 2016-04-29 NOTE — ED Provider Notes (Signed)
3:12 PM Nursing called report patient was unable to provide urine as ordered by psychiatry team. Patient had bladder scan which showed several 100 mL of urine.  It out catheter was placed and urinalysis was sent. Urine revealed cloudy appearance, trace leukocytes, and a few bacteria. There was 0-5 epithelial cells.  Given lack of dysuria, abdominal pain, or other symptoms, antibiotics will be held initially. Patient will have urine culture sent.   Patient remains medically cleared for further psychiatric management.   Canary Brimhristopher J Tegeler, MD 04/29/16 2101

## 2016-04-29 NOTE — BH Assessment (Signed)
[facility] who stated the pt would be accepted if the doctor is willing to IVC the pt. Spoke with Hardie LoraLilibeth, RN to advise that the pt has been accepted to [facility] contingent upon being IVC'd. Hardie LoraLilibeth, RN is unsure if the pt will be IVC'd at this time.   Princess BruinsAquicha Duff, MSW, Theresia MajorsLCSWA

## 2016-04-29 NOTE — ED Notes (Signed)
Patient c/o left shoulder pain which is chronic due to previous surgeries.
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
## 2016-04-29 NOTE — ED Notes (Signed)
[facility] feeding during the night

## 2016-04-30 DIAGNOSIS — F332 Major depressive disorder, recurrent severe without psychotic features: Secondary | ICD-10-CM | POA: Diagnosis not present

## 2016-04-30 NOTE — ED Notes (Signed)
Patient having difficulty urinating. He saying it is painful.

## 2016-04-30 NOTE — BH Assessment (Addendum)
04/30/16 0945.  TTS spoke with Alexander [name] to reassess.  Pt communicates through writing or giving thumbs up/thumbs down.  Pt reports he feels good today, but slept "so so".  When asked about depression and thoughts of harming self today, Alexander [name] said he no longer had suicidal thoughts and wanted to go home.  TTS asked question several times and pt indicates he has no SI and wants to return home.  Pt denies HI, denies any hallucinations.  Dr Jannifer FranklinAkintayo informed and will take this into consideration. Daleen SquibbGreg Ulanda Tackett, LCSW  04/30/16. 1120.  Discussed pt with Dr Jannifer FranklinAkintayo, who reports that pt continues to meet inpatient criteria, however, pt will be reevaluated Monday morning (Dr Jannifer FranklinAkintayo will be working Monday) and if pt continues to express no SI, he will consider discharge at that time. Daleen SquibbGreg Arien Benincasa, LCSW

## 2016-05-01 DIAGNOSIS — F322 Major depressive disorder, single episode, severe without psychotic features: Secondary | ICD-10-CM | POA: Diagnosis not present

## 2016-05-01 DIAGNOSIS — Z79899 Other long term (current) drug therapy: Secondary | ICD-10-CM | POA: Diagnosis not present

## 2016-05-01 DIAGNOSIS — Z87891 Personal history of nicotine dependence: Secondary | ICD-10-CM | POA: Diagnosis not present

## 2016-05-01 DIAGNOSIS — F332 Major depressive disorder, recurrent severe without psychotic features: Secondary | ICD-10-CM | POA: Diagnosis not present

## 2016-05-01 DIAGNOSIS — Z808 Family history of malignant neoplasm of other organs or systems: Secondary | ICD-10-CM | POA: Diagnosis not present

## 2016-05-01 NOTE — [facility] Suicide Risk Assessment (Signed)
Suicide Risk Assessment  Discharge Assessment   [facility]   Principal Problem: Major depressive disorder, single episode, severe without psychosis [facility](HCC) Discharge Diagnoses:  Patient Active Problem List   Diagnosis Date Noted  . 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 Major depressive disorder, single episode, severe without psychosis ([facility]CC) [F32.2] 10/09/2012    Priority: High  . Dermatitis [L30.9] 11/10/2015  . Chronic bronchitis ([facility]CC) [J42] 10/04/2015  . Constipation [K59.00] 09/22/2015  . Pain in testicle [N50.819] 08/09/2015  . Insomnia [G47.00] 08/02/2015  . Urinary retention [R33.9] 07/26/2015  . BPH (benign prostatic hyperplasia) [N40.0] 07/26/2015  . Dysuria [R30.0] 07/19/2015  . Expressive aphasia [R47.01] 07/19/2015  . Decubitus ulcer of sacral area [L89.159] 11/19/2014  . Seborrhea capitis [L21.0] 11/19/2014  . Hypercholesterolemia without hypertriglyceridemia [E78.00] 11/04/2014  . 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 TBI (traumatic brain injury) (HCC) [S06.9X9A] 05/24/2014  . Diabetes mellitus, type 2 (HCC) [E11.9] 10/10/2013  . CN (constipation) [K59.00] 06/12/2013  . Cervical myelopathy (HCC) [G95.9] 10/09/2012  . C1 cervical fracture ([facility]CC) [S12.000A] 10/09/2012  . Seizure (HCC) [R56.9]     Total Time spent with patient: 30 minutes   Musculoskeletal: Strength & Muscle Tone: within normal limits Gait & Station: unable to stand Patient leans: N/A  Psychiatric Specialty Exam: Physical Exam  Constitutional: He is oriented to person, place, and time. He appears well-nourished.  HENT:  Head: Normocephalic.  Respiratory: Effort normal.  Neurological: He is alert and oriented to person, place, and time.  Skin: Skin is warm and dry.  Psychiatric: His behavior is normal. Judgment and thought content normal. Cognition and memory are normal. He exhibits a depressed mood.   nonverbal    Review of Systems  Constitutional: Positive for malaise/fatigue.  HENT: Negative.   Eyes: Negative.   Respiratory: Negative.   Cardiovascular: Negative.   Gastrointestinal: Negative.   Genitourinary: Negative.   Musculoskeletal:       Paralysis   Skin: Negative.   Neurological: Negative.   Endo/Heme/Allergies: Negative.   Psychiatric/Behavioral: Positive for depression.    Blood pressure 101/67, pulse 60, temperature 98.2 F (36.8 C), temperature source Oral, resp. rate 18, SpO2 94 %.There is no height or weight on file to calculate BMI.  General Appearance: Casual  Eye Contact:  Good   Speech:  non verbal, shakes his head, nods his head  Volume:  N/A  Mood:  Depressed, minimal  Affect:  Congruent   Thought Process: unable to assess  Orientation:  Other:  unable to assess  Thought Content:  Logical  Suicidal Thoughts:  No  Homicidal Thoughts:  No  Memory:  Immediate;   Fair Recent;   Fair Remote;   Good  Judgement:  Fair  Insight:  Fair  Psychomotor Activity:  Normal  Concentration:  Concentration: Fair and Attention Span: Fair  Recall:  [employer]  Fund of Knowledge:  Fair  Language:  none  Akathisia:  No  Handed:  Right  AIMS (if indicated):     Assets:  Social Support, housing  ADL's:  Impaired  Cognition:  WNL  Sleep:   fair    Mental Status Per Nursing Assessment::   On Admission:   depression, suicide attempt  Demographic Factors:  Male and Caucasian  Loss Factors: Decline in physical health  Historical Factors: NA  Risk Reduction Factors:   Sense of responsibility to family, Living with another person, especially a relative and Positive social support  Continued Clinical  Symptoms:  Depression, moderate to mild  Cognitive Features That Contribute To Risk:  None    Suicide Risk:  Minimal: No identifiable suicidal ideation.  Patients presenting with no risk factors but with morbid ruminations; may be classified as minimal risk based  on the severity of the depressive symptoms    Plan Of Care/Follow-up recommendations:  Activity:  as tolerated Diet:  heart healthy diet  LORD, JAMISON, NP 05/01/2016, 9:58 AM

## 2016-05-01 NOTE — Progress Notes (Signed)
CSW received call from Elwanda Brooklyneresa Holsey, [facility] Coord[facility] She stated they are not wanting to accept patient back at their facility due to behavior patient exhibited by cutting himself with disposable razor blade. She stated they feel patient will need more services. CSW informed her if they feel patient needs more services, they would need to implement the services and patient would need to return to their facility. She stated the administrator at the facility would call and speak with CSW. Psychiatrist and NP made aware.  Elenore PaddyLaVonia Freedom Lopezperez, LCSWA [phone] ED CSW 05/01/2016 10:28 AM

## 2016-05-01 NOTE — [place] FL2 (Signed)
[place] MEDICAID FL2 LEVEL OF CARE SCREENING TOOL     IDENTIFICATION  Patient Name: Alexander [name] Birthdate: Oct 08, 1955 Sex: male Admission Date (Current Location): 04/28/2016  [facility] and [place] Number:  Producer, television/film/video and Address:  [facility],  [address] [place] [address]      Provider Number: [id number]  Attending Physician Name and Address:  Provider Default, MD  Relative Name and Phone Number:       Current Level of Care: Hospital Recommended Level of Care: Skilled Nursing Facility Prior Approval Number:    Date Approved/Denied:   PASRR Number: [id number]  Discharge Plan: SNF    Current Diagnoses: Patient Active Problem List   Diagnosis Date Noted  . Dermatitis 11/10/2015  . Chronic bronchitis (HCC) 10/04/2015  . Constipation 09/22/2015  . Pain in testicle 08/09/2015  . Insomnia 08/02/2015  . Urinary retention 07/26/2015  . BPH (benign prostatic hyperplasia) 07/26/2015  . Dysuria 07/19/2015  . Expressive aphasia 07/19/2015  . Decubitus ulcer of sacral area 11/19/2014  . Seborrhea capitis 11/19/2014  . 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 Hypercholesterolemia without hypertriglyceridemia 11/04/2014  . 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 TBI (traumatic brain injury) (HCC) 05/24/2014  . Diabetes mellitus, type 2 (HCC) 10/10/2013  . CN (constipation) 06/12/2013  . Cervical myelopathy (HCC) 10/09/2012  . C1 cervical fracture (HCC) 10/09/2012  . 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 Seizure (HCC)     Orientation RESPIRATION BLADDER Height & Weight     Self, Time, Situation, Place  Normal Incontinent Weight:   Height:     BEHAVIORAL SYMPTOMS/MOOD NEUROLOGICAL BOWEL NUTRITION STATUS    Convulsions/Seizures Incontinent Feeding tube  AMBULATORY STATUS COMMUNICATION OF NEEDS Skin   Total Care Non-Verbally Skin abrasions (Pressure Ulcer)                        Personal Care Assistance Level of Assistance  Total care       Total Care Assistance: Maximum assistance   Functional Limitations Info  Speech          SPECIAL CARE FACTORS FREQUENCY                       Contractures      Additional Factors Info  Code Status, Allergies Code Status Info: Full Code  Allergies Info: Allergies: Amoxicillin           Current Medications (05/01/2016):  This is the current hospital active medication list Current Facility-Administered Medications  Medication Dose Route Frequency Provider Last Rate Last Dose  . acetaminophen (TYLENOL) tablet 1,000 mg  1,000 mg Per Tube TID Maia Plan, MD   1,000 mg at 05/01/16 1553  . allopurinol (ZYLOPRIM) tablet 300 mg  300 mg Per Tube Daily Maia Plan, MD   300 mg at 05/01/16 5621  . bisacodyl (DULCOLAX) suppository 10 mg  10 mg Rectal PRN Maia Plan, MD      . calcium-vitamin D (OSCAL WITH D) 500-200 MG-UNIT per tablet 1 tablet  1 tablet Per Tube Daily Maia Plan, MD   1 tablet at 05/01/16 [phone]  . dextromethorphan-guaiFENesin (ROBITUSSIN-DM) 10-100 MG/5ML liquid 30 mL  30 mL Per Tube Q8H Maia Plan, MD   30 mL at 05/01/16 1555  . feeding supplement (JEVITY 1.5 CAL/FIBER) liquid  105 mL/hr Per Tube Q24H Maia Plan, MD   105 mL/hr at  04/30/16 1903  . fentaNYL (DURAGESIC - dosed mcg/hr) patch 25 mcg  25 mcg Transdermal Q72H Maia PlanJoshua G Long, MD   25 mcg at 04/30/16 2144  . fluticasone (FLONASE) 50 MCG/ACT nasal spray 1 spray  1 spray Each Nare QHS Maia PlanJoshua G Long, MD   1 spray at 04/30/16 2145  . free water 250 mL  250 mL Per Tube Q4H Maia PlanJoshua G Long, MD   250 mL at 05/01/16 1354  . gabapentin (NEURONTIN) capsule 1,500 mg  1,500 mg Per Tube Daily Maia PlanJoshua G Long, MD   1,500 mg at 05/01/16 0935  . ipratropium-albuterol (DUONEB) 0.5-2.5 (3) MG/3ML nebulizer solution 3 mL  3 mL Nebulization Q6H PRN Lorre NickAnthony Allen, MD      . multivitamin liquid 15 mL  15 mL Per Tube Daily Maia PlanJoshua  G Long, MD   15 mL at 05/01/16 0936  . ondansetron (ZOFRAN) tablet 4 mg  4 mg Per Tube Q6H PRN Maia PlanJoshua G Long, MD      . Oxcarbazepine (TRILEPTAL) tablet 600 mg  600 mg Per Tube QHS Maia PlanJoshua G Long, MD   600 mg at 04/30/16 2145  . oxyCODONE (Oxy IR/ROXICODONE) immediate release tablet 10 mg  10 mg Per Tube Q4H PRN Maia PlanJoshua G Long, MD   10 mg at 05/01/16 1553  . pantoprazole sodium (PROTONIX) 40 mg/20 mL oral suspension 40 mg  40 mg Oral QAC breakfast Maia PlanJoshua G Long, MD   40 mg at 05/01/16 1012  . polyethylene glycol (MIRALAX / GLYCOLAX) packet 17 g  17 g Per Tube Daily Maia PlanJoshua G Long, MD   17 g at 05/01/16 [id number]  . polyvinyl alcohol (LIQUIFILM TEARS) 1.4 % ophthalmic solution 1 drop  1 drop Both Eyes Daily Maia PlanJoshua G Long, MD   1 drop at 05/01/16 [phone]  . tamsulosin (FLOMAX) capsule 1.2 mg  1.2 mg Oral Daily Maia PlanJoshua G Long, MD   1.2 mg at 05/01/16 0936  . topiramate (TOPAMAX) tablet 100 mg  100 mg Per Tube BID Maia PlanJoshua G Long, MD   100 mg at 05/01/16 0935  . traZODone (DESYREL) tablet 200 mg  200 mg Per Tube QHS Maia PlanJoshua G Long, MD   200 mg at 04/30/16 2145  . traZODone (DESYREL) tablet 25 mg  25 mg Per Tube BID PRN Maia PlanJoshua G Long, MD   25 mg at 05/01/16 0408  . venlafaxine (EFFEXOR) tablet 100 mg  100 mg Per Tube BID Maia PlanJoshua G Long, MD   100 mg at 05/01/16 [id number]   Current Outpatient Prescriptions  Medication Sig Dispense Refill  . acetaminophen (TYLENOL) 500 MG tablet Place 1,000 mg into feeding tube 3 (three) times daily.    Marland Kitchen. allopurinol (ZYLOPRIM) 300 MG tablet 300 mg by PEG Tube route daily.     . bisacodyl (DULCOLAX) 10 MG suppository Place 1 suppository (10 mg total) rectally as needed for moderate constipation. (Patient taking differently: Place 10 mg rectally daily as needed for moderate constipation. ) 12 suppository 1  . calcium-vitamin D (OSCAL WITH D) 500-200 MG-UNIT per tablet 1 tablet by PEG Tube route daily.    . carboxymethylcellulose (REFRESH PLUS) 0.5 % SOLN Place 1 drop into both eyes daily.      Marland Kitchen. Dextromethorphan-Guaifenesin (MUCINEX FAST-MAX DM MAX) 5-100 MG/5ML LIQD Place 30 mLs into feeding tube every 8 (eight) hours.    . diclofenac sodium (VOLTAREN) 1 % GEL Apply 2 g topically 4 (four) times daily. Apply to left shoulder.    . esomeprazole (NEXIUM) 40 MG  packet Take 40 mg by mouth daily before breakfast. Per tube    . fentaNYL (DURAGESIC - DOSED MCG/HR) 25 MCG/HR patch Place 25 mcg onto the skin every 3 (three) days.    . fluticasone (FLONASE) 50 MCG/ACT nasal spray Place 1 spray into both nostrils at bedtime.     . gabapentin (NEURONTIN) 300 MG capsule Place 1,500 mg into feeding tube daily.     . Hyprom-Naphaz-Polysorb-Zn Sulf (CLEAR EYES COMPLETE OP) Place 1 drop into both eyes daily.    . Lacosamide (VIMPAT) 100 MG TABS Take one tablet by mouth via tube twice daily for seizures 60 tablet 0  . lidocaine (XYLOCAINE) 5 % ointment Apply 1 application topically 2 (two) times daily as needed (For left shoulder and neck.).     Marland Kitchen. Melatonin 3 MG TABS 1 nightly for sleep (Patient taking differently: Place 3 mg into feeding tube at bedtime. ) 30 tablet 5  . Multiple Vitamins-Minerals (MULTIVITAMIN) LIQD Place 15 mLs into feeding tube daily.    . Nutritional Supplements (ISOSOURCE 1.5 CAL) LIQD Place 105 mL/hr into feeding tube See admin instructions. Run for 13 hours on at 6am and off at 7am.    . ondansetron (ZOFRAN) 4 MG tablet Place 4 mg into feeding tube every 6 (six) hours as needed for nausea or vomiting.    . Oxcarbazepine (TRILEPTAL) 300 MG tablet Place 600 mg into feeding tube at bedtime.     . Oxycodone HCl 10 MG TABS Take one tablet by mouth every 6 hours as needed for pain (Patient taking differently: Place 10 mg into feeding tube every 4 (four) hours as needed (For pain.). ) 120 tablet 0  . polyethylene glycol (MIRALAX / GLYCOLAX) packet Place 17 g into feeding tube daily. Mix with 8 oz water and place in tube every night at bedtime (Patient taking differently: Place 17 g into  feeding tube 2 (two) times daily. ) 14 each 0  . Sodium Phosphates (FLEET ENEMA RE) Place 1 each rectally daily as needed (For constipation.).    Marland Kitchen. tamsulosin (FLOMAX) 0.4 MG CAPS capsule Take 1.2 mg by mouth daily. Via tube daily. *Swallow whole* *Do not crush, chew, or open capsules*     . topiramate (TOPAMAX) 100 MG tablet Take 1 tablet (100 mg total) by mouth 2 (two) times daily. PEG tube (Patient taking differently: Place 100 mg into feeding tube 2 (two) times daily. ) 60 tablet 6  . traZODone (DESYREL) 100 MG tablet 200 mg by PEG Tube route at bedtime.     . traZODone (DESYREL) 50 MG tablet Place 25 mg into feeding tube 2 (two) times daily as needed (For anxiety.).     Marland Kitchen. venlafaxine (EFFEXOR) 100 MG tablet 100 mg by PEG Tube route 2 (two) times daily.     . Water For Irrigation, Sterile (FREE WATER) SOLN Place 250 mLs into feeding tube every 4 (four) hours.    Marland Kitchen. levofloxacin (LEVAQUIN) 750 MG tablet Take 1 tablet (750 mg total) by mouth daily. X 7 days (Patient not taking: Reported on 03/15/2016) 7 tablet 0  . NEOMYCIN-POLYMYXIN-HYDROCORTISONE (CORTISPORIN) 1 % SOLN otic solution Place 3 drops into the left ear every 6 (six) hours. (Patient not taking: Reported on 03/15/2016) 10 mL 0     Discharge Medications: Please see discharge summary for a list of discharge medications.  Relevant Imaging Results:  Relevant Lab Results:   Additional Information SS#: [id number]  Donnie CoffinErin M Glorianna Gott, LCSW

## 2016-05-01 NOTE — Progress Notes (Signed)
CSW contacted Alexander [name] with Alexander [name] who stated patient is able to come back to facility. CSW fax updated FL2 and AVS to [phone]. CSW notified RN that patient would leave by PTAR.   Stacy GardnerErin Juan Olthoff, LCSWA Clinical Social Worker [phone]

## 2016-05-01 NOTE — Progress Notes (Signed)
Pt with [facility] 5 ED visits in the last 6 months  Pcp updated to Dr Cleophas DunkerGerardo Zapata Pt [facility]  ED SW involved in disposition Pt quiet on TCU unit [facility]

## 2016-05-01 NOTE — ED Notes (Signed)
Alexander [name] is at bedside. Held G-tube feeding due to patient being transported back to facility. Informed Alexander [name] to let facility staff know to start his feeding when he arrives. Pt is alert, and appears in no acute distress.

## 2016-05-01 NOTE — Progress Notes (Signed)
CSW informed by Psychiatrist patient has been psychiatrically cleared and ready for discharge.  9:08am- CSW attempted call and left message for [facility] Coordinator at [facility] [phone] with name and contact number for return call.  Alexander [name], LCSWA [phone] ED CSW 05/01/2016 9:23 AM

## 2016-05-01 NOTE — Progress Notes (Addendum)
Call received from Larene BeachBetisha Williams, Director of Nursing from [facility] providing additional information as they stated they were concerned that patient will have repeat behaviors. She stated for past six months to a year, patient has been infatuated with a CNA at their facility. She stated patient has made allegations against other staff to have that particular CNA as a Financial controllerworker. She stated one of their staff members recommended that patient be sent for inpatient treatment. She stated patient cut his arm because he wants that CNA for himself.   CSW informed Larene BeachBetisha Williams and other staff via phone that patients disposition is given by the Psychiatrist and NP here in the ED. CSW informed her that once a patient stabilizes and is cleared psychiatrically, a call is made for patient to return to their facility. CSW also noted to them that patient cannot remain in the ED and he would need to return to their facility for them to coordinate other services or locate patient another placement. They stated they would make a call and call back. NP made aware of this information.   2:32pm- Message left for Almedia BallsBetisha Willams, Director of Nursing, [facility] [phone]. Message reiterated patient has been psychiatrically cleared and wanting to know the time patient would be picked up on today.    Elenore PaddyLaVonia Leina Babe, LCSWA [phone] ED CSW 05/01/2016 12:44 PM

## 2016-05-01 NOTE — ED Notes (Signed)
Pt refuse care to be clean up
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
## 2016-05-02 LAB — URINE CULTURE

## 2016-05-03 ENCOUNTER — Telehealth (HOSPITAL_BASED_OUTPATIENT_CLINIC_OR_DEPARTMENT_OTHER): Payer: Self-pay | Admitting: *Deleted

## 2016-05-03 NOTE — Telephone Encounter (Signed)
Post ED Visit - Positive Culture Follow-up  Culture report reviewed by antimicrobial stewardship pharmacist:  []  Alexander [name], Pharm.D. [x]  Alexander [name], Pharm.D., BCPS []  Alexander [name], Pharm.D. []  Alexander [name], Pharm.D., BCPS []  Alexander [name], [address] BCPS, AAHIVP []  Alexander [name], Pharm.D., BCPS, AAHIVP []  Alexander [name], Pharm.D. []  Alexander [name], [address]  Positive urine culture   No treatment required . Alexander [name], Alexander [name] [facility] 05/03/2016, 1:54 PM

## 2016-05-15 ENCOUNTER — Emergency Department (HOSPITAL_COMMUNITY)
Admission: EM | Admit: 2016-05-15 | Discharge: 2016-05-15 | Disposition: A | Discharge status: Home / Self Care | Payer: Medicare HMO | Attending: Emergency Medicine | Admitting: Emergency Medicine

## 2016-05-15 ENCOUNTER — Encounter (HOSPITAL_COMMUNITY): Payer: Self-pay

## 2016-05-15 ENCOUNTER — Emergency Department (HOSPITAL_COMMUNITY): Payer: Medicare HMO

## 2016-05-15 DIAGNOSIS — Z79899 Other long term (current) drug therapy: Secondary | ICD-10-CM | POA: Diagnosis not present

## 2016-05-15 DIAGNOSIS — J069 Acute upper respiratory infection, unspecified: Secondary | ICD-10-CM | POA: Diagnosis not present

## 2016-05-15 DIAGNOSIS — G231 Progressive supranuclear ophthalmoplegia [Steele-Richardson-Olszewski]: Secondary | ICD-10-CM | POA: Insufficient documentation

## 2016-05-15 DIAGNOSIS — Y92239 Unspecified place in hospital as the place of occurrence of the external cause: Secondary | ICD-10-CM | POA: Diagnosis not present

## 2016-05-15 DIAGNOSIS — F329 Major depressive disorder, single episode, unspecified: Secondary | ICD-10-CM | POA: Diagnosis not present

## 2016-05-15 DIAGNOSIS — Y9389 Activity, other specified: Secondary | ICD-10-CM | POA: Diagnosis not present

## 2016-05-15 DIAGNOSIS — Z87891 Personal history of nicotine dependence: Secondary | ICD-10-CM | POA: Diagnosis not present

## 2016-05-15 DIAGNOSIS — Y999 Unspecified external cause status: Secondary | ICD-10-CM | POA: Insufficient documentation

## 2016-05-15 DIAGNOSIS — I10 Essential (primary) hypertension: Secondary | ICD-10-CM | POA: Insufficient documentation

## 2016-05-15 DIAGNOSIS — S60812A Abrasion of left wrist, initial encounter: Secondary | ICD-10-CM | POA: Insufficient documentation

## 2016-05-15 DIAGNOSIS — R0602 Shortness of breath: Secondary | ICD-10-CM

## 2016-05-15 DIAGNOSIS — E119 Type 2 diabetes mellitus without complications: Secondary | ICD-10-CM | POA: Insufficient documentation

## 2016-05-15 DIAGNOSIS — X788XXA Intentional self-harm by other sharp object, initial encounter: Secondary | ICD-10-CM | POA: Insufficient documentation

## 2016-05-15 DIAGNOSIS — J988 Other specified respiratory disorders: Secondary | ICD-10-CM

## 2016-05-15 DIAGNOSIS — S6992XA Unspecified injury of left wrist, hand and finger(s), initial encounter: Secondary | ICD-10-CM | POA: Diagnosis present

## 2016-05-15 MED ORDER — GUAIFENESIN 100 MG/5ML PO LIQD: 200.0000 mg | Freq: Four times a day (QID) | ORAL | 0 refills | Status: DC

## 2016-05-15 MED ORDER — OXYCODONE HCL 5 MG PO TABS
10.0000 mg | ORAL_TABLET | Freq: Once | ORAL | Status: AC
  Administered 2016-05-15: 10 mg
  Filled 2016-05-15: qty 2

## 2016-05-15 NOTE — Progress Notes (Signed)
Patient NT suctioned with copious amounts of white sputum obtained. SP02 95% patient tolerated. RT will continue to monitor.

## 2016-05-15 NOTE — ED Triage Notes (Signed)
[facility] Nurse states pt is non verbal and he uses a ink pen and a computer to communicate. States pt wanted a shower and no there was no one at the moment to give one. States the pt got bad and stared stabbing himself in the wrist with the pen. Pt has superficial abrasions to left wrist

## 2016-05-15 NOTE — Discharge Instructions (Signed)
Take the medicine prescribed which may help thin your respiratory secretions for easier removal.  Make sure to increase your fluid intake while taking this medicine.
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
## 2016-05-15 NOTE — ED Triage Notes (Signed)
[facility] behavior is normal for pt when he gets upset

## 2016-05-24 ENCOUNTER — Ambulatory Visit: Payer: Medicare HMO | Admitting: Neurology

## 2016-08-21 IMAGING — CR DG CERVICAL SPINE 2 OR 3 VIEWS
6 series · 6 of 6 positions shown · non-contrast
Comparison: CT cervical spine 03/04/2015.

CLINICAL DATA: Status post fall today.  Initial encounter.

EXAM:
CERVICAL SPINE - 2-3 VIEW

[w cervical spine lat]
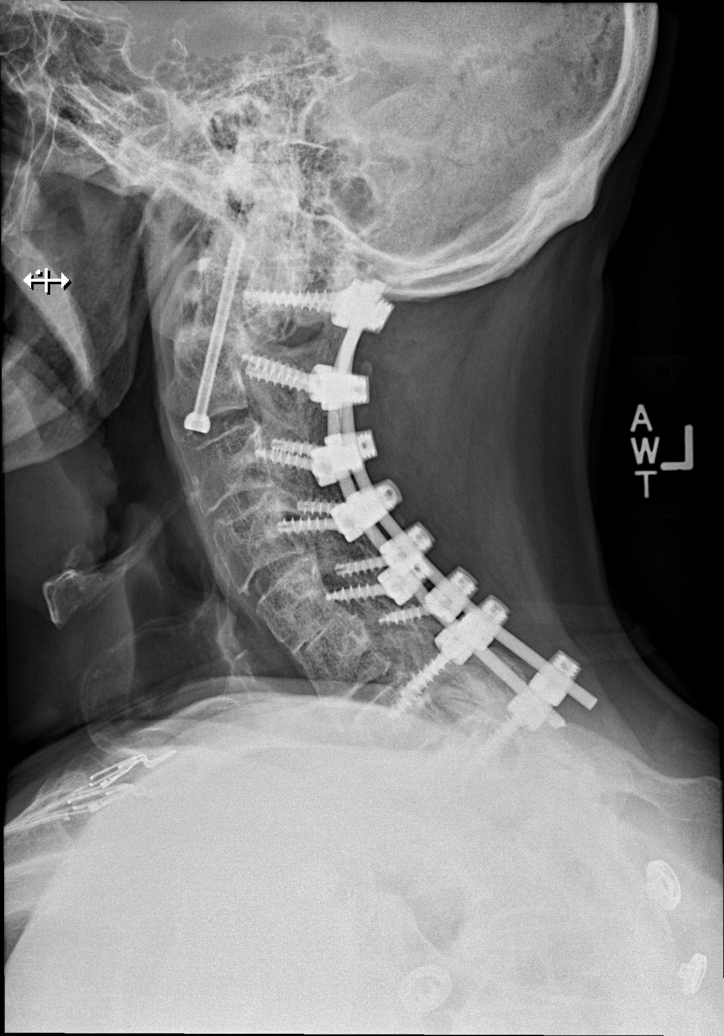

[w cervical swimmers]
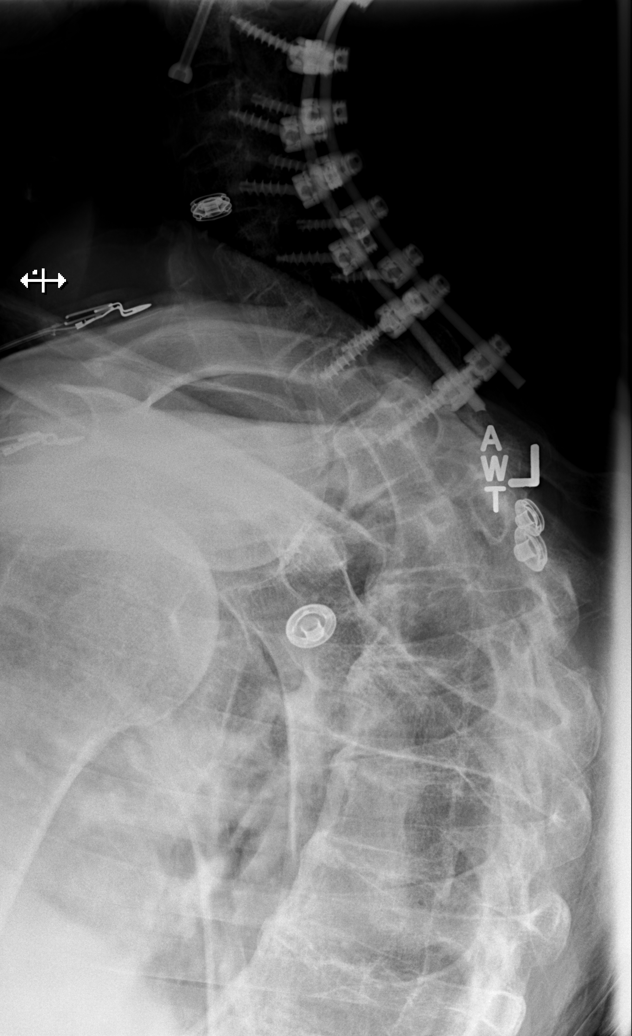

[x cervical spine ap]
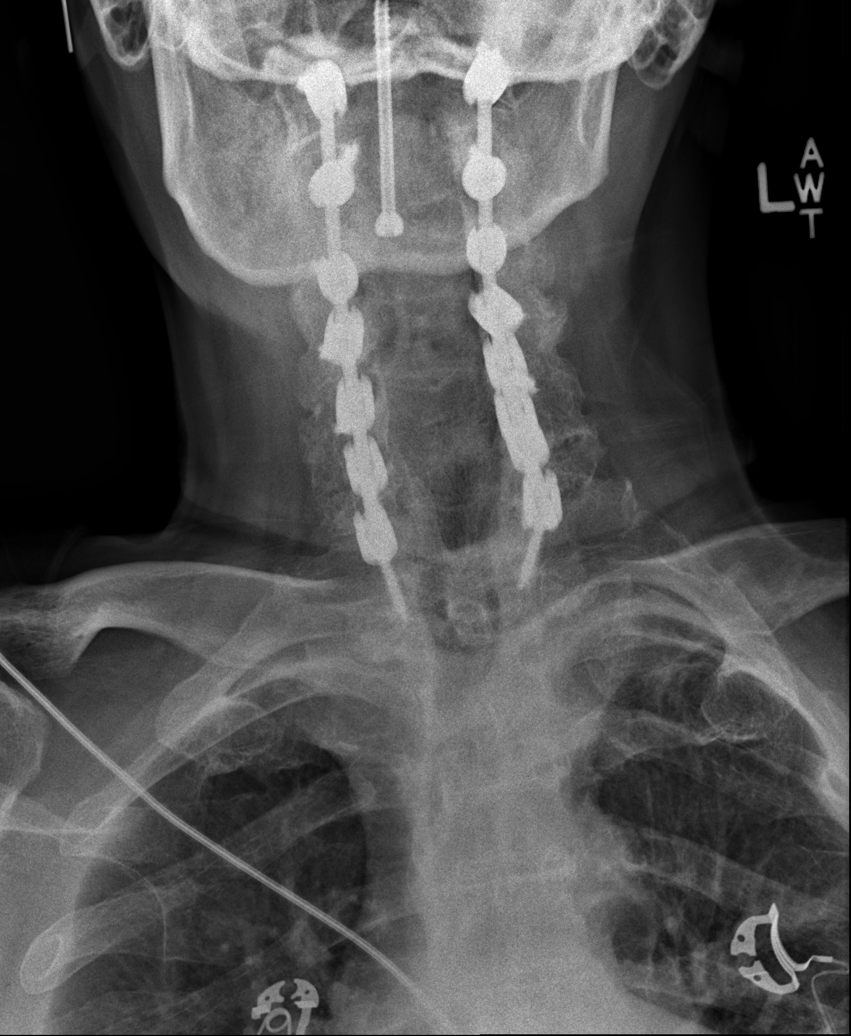

[x cervical spine odontoid (1 of 3)]
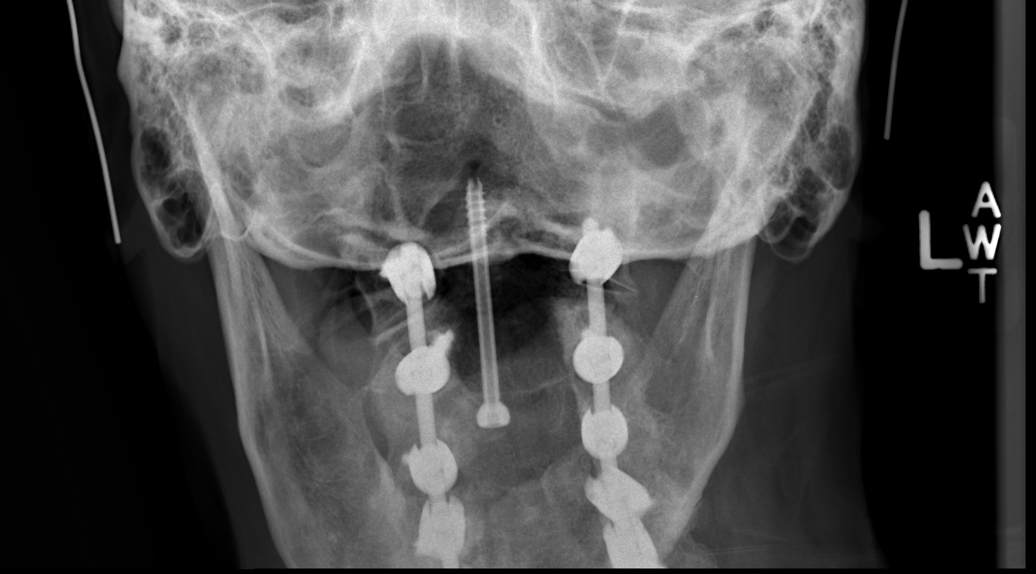

[x cervical spine odontoid (2 of 3)]
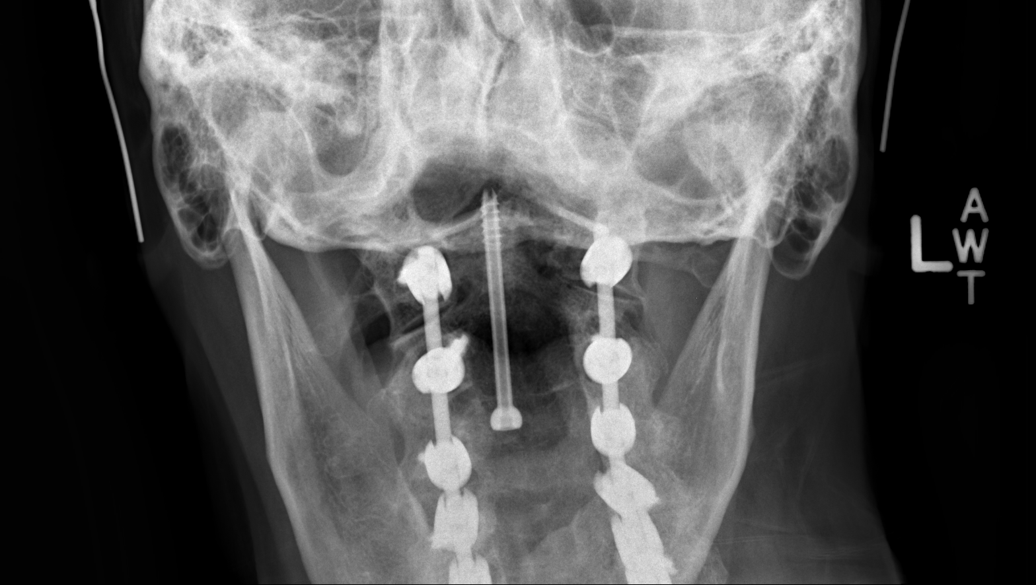

[x cervical spine odontoid (3 of 3)]
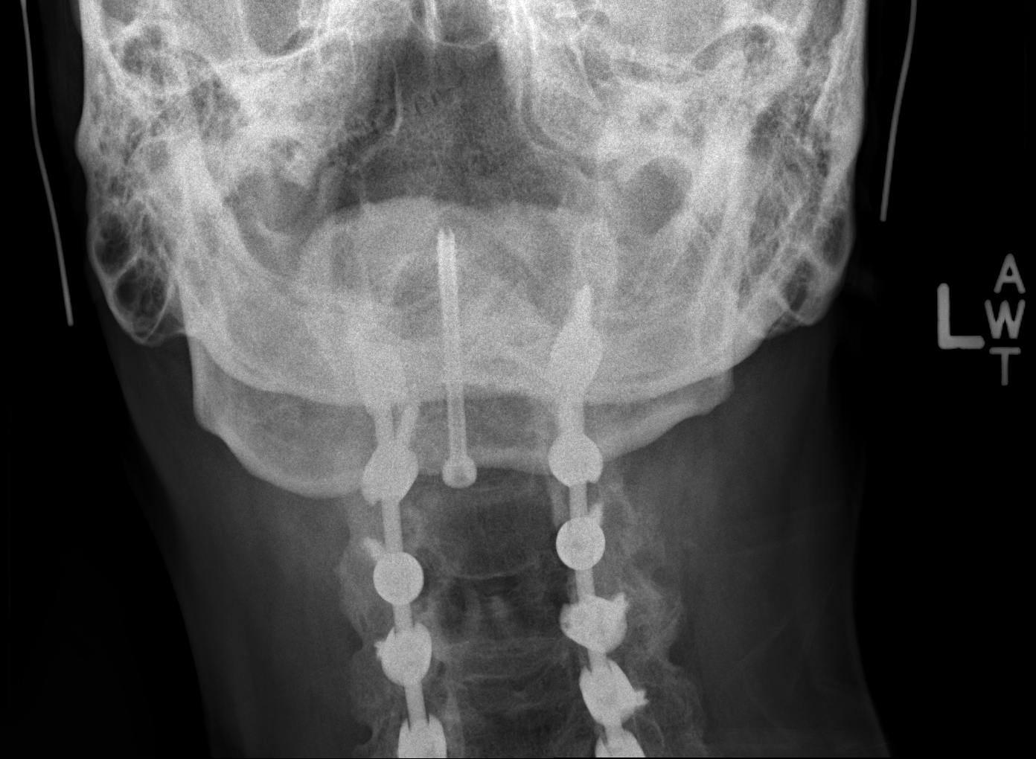

[6 of 6 positions shown; findings below may reference images not displayed]

FINDINGS: The patient is status post C1-7 fusion and fixation of of a C2
fracture as seen on the prior exam. There is no evidence of cervical
spine fracture or prevertebral soft tissue swelling. Alignment is
normal. No other significant bone abnormalities are identified.
IMPRESSION: Negative cervical spine radiographs.

## 2016-10-24 ENCOUNTER — Encounter (HOSPITAL_COMMUNITY): Payer: Self-pay | Admitting: Emergency Medicine

## 2016-10-24 ENCOUNTER — Emergency Department (HOSPITAL_COMMUNITY): Payer: Medicare Other

## 2016-10-24 ENCOUNTER — Emergency Department (HOSPITAL_COMMUNITY)
Admission: EM | Admit: 2016-10-24 | Discharge: 2016-10-24 | Disposition: A | Discharge status: Home / Self Care | Payer: Medicare Other | Attending: Emergency Medicine | Admitting: Emergency Medicine

## 2016-10-24 DIAGNOSIS — M25562 Pain in left knee: Secondary | ICD-10-CM | POA: Insufficient documentation

## 2016-10-24 DIAGNOSIS — I10 Essential (primary) hypertension: Secondary | ICD-10-CM | POA: Insufficient documentation

## 2016-10-24 DIAGNOSIS — E119 Type 2 diabetes mellitus without complications: Secondary | ICD-10-CM | POA: Diagnosis not present

## 2016-10-24 DIAGNOSIS — Z87891 Personal history of nicotine dependence: Secondary | ICD-10-CM | POA: Insufficient documentation

## 2016-10-24 MED ORDER — MORPHINE SULFATE (PF) 4 MG/ML IV SOLN
4.0000 mg | Freq: Once | INTRAVENOUS | Status: AC
  Administered 2016-10-24: 2 mg via INTRAMUSCULAR
  Filled 2016-10-24: qty 1

## 2016-10-24 NOTE — ED Notes (Signed)
Family at bedside. 

## 2016-10-24 NOTE — ED Triage Notes (Signed)
Pt had mobile xrays taken at [facility] today for pain in the left knee.  Report indicates possible fracture of left tibal plateau.  Pt given oxycodone at 1720 and baclofen at 1800  Pt gave thumbs up when asked if he was still having pain in left knee.  Pt indicates pain for more than a couple days but not as long as a month.

## 2016-10-24 NOTE — ED Notes (Signed)
Report given to [facility] LPN at [facility].  Transported back by [facility]
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
## 2016-10-24 NOTE — Discharge Instructions (Signed)
Take your usual prescriptions as previously directed.  Apply moist heat or ice to the area(s) of discomfort, for 15 minutes at a time, several times per day for the next few days.  Do not fall asleep on a heating or ice pack.  Call your regular medical doctor tomorrow to schedule a follow up appointment in the next 2 days.  Return to the Emergency Department immediately if worsening. ° °

## 2016-10-24 NOTE — ED Notes (Signed)
Patient transported to X-ray 

## 2016-11-07 ENCOUNTER — Encounter: Payer: Self-pay | Admitting: Orthopedic Surgery

## 2016-11-07 ENCOUNTER — Ambulatory Visit (INDEPENDENT_AMBULATORY_CARE_PROVIDER_SITE_OTHER): Payer: Medicare Other | Admitting: Orthopedic Surgery

## 2016-11-07 ENCOUNTER — Other Ambulatory Visit: Payer: Self-pay | Admitting: *Deleted

## 2016-11-07 VITALS — BP 105/68 | HR 61 | Wt 155.0 lb

## 2016-11-07 DIAGNOSIS — I998 Other disorder of circulatory system: Secondary | ICD-10-CM
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
## 2016-11-08 ENCOUNTER — Other Ambulatory Visit: Payer: Self-pay | Admitting: Vascular Surgery

## 2016-11-08 ENCOUNTER — Encounter (HOSPITAL_COMMUNITY): Payer: Self-pay

## 2016-11-08 ENCOUNTER — Encounter: Payer: Self-pay | Admitting: Vascular Surgery

## 2016-11-09 ENCOUNTER — Other Ambulatory Visit: Payer: Self-pay | Admitting: Vascular Surgery

## 2016-11-09 DIAGNOSIS — M79605 Pain in left leg: Secondary | ICD-10-CM

## 2016-11-17 ENCOUNTER — Encounter: Payer: Self-pay | Admitting: Vascular Surgery

## 2016-11-24 ENCOUNTER — Ambulatory Visit (INDEPENDENT_AMBULATORY_CARE_PROVIDER_SITE_OTHER): Payer: Medicare Other | Admitting: Vascular Surgery

## 2016-11-24 ENCOUNTER — Encounter: Payer: Self-pay | Admitting: Vascular Surgery

## 2016-11-24 ENCOUNTER — Ambulatory Visit (HOSPITAL_COMMUNITY)
Admission: RE | Admit: 2016-11-24 | Discharge: 2016-11-24 | Disposition: A | Discharge status: Home / Self Care | Payer: Medicare Other | Source: Ambulatory Visit | Attending: Vascular Surgery | Admitting: Vascular Surgery

## 2016-11-24 VITALS — BP 116/81 | HR 79 | Temp 98.9°F | Resp 16 | Ht 69.0 in | Wt 155.0 lb

## 2016-11-24 DIAGNOSIS — M79605 Pain in left leg: Secondary | ICD-10-CM | POA: Diagnosis not present

## 2016-11-24 DIAGNOSIS — R209 Unspecified disturbances of skin sensation: Secondary | ICD-10-CM | POA: Diagnosis not present
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
## 2017-01-15 ENCOUNTER — Emergency Department (HOSPITAL_COMMUNITY)
Admission: EM | Admit: 2017-01-15 | Discharge: 2017-01-15 | Disposition: A | Discharge status: Home / Self Care | Payer: Medicare Other | Attending: Emergency Medicine | Admitting: Emergency Medicine

## 2017-01-15 ENCOUNTER — Encounter (HOSPITAL_COMMUNITY): Payer: Self-pay | Admitting: Emergency Medicine

## 2017-01-15 DIAGNOSIS — Z87820 Personal history of traumatic brain injury: Secondary | ICD-10-CM | POA: Insufficient documentation

## 2017-01-15 DIAGNOSIS — I10 Essential (primary) hypertension: Secondary | ICD-10-CM | POA: Insufficient documentation

## 2017-01-15 DIAGNOSIS — Z79899 Other long term (current) drug therapy: Secondary | ICD-10-CM | POA: Insufficient documentation

## 2017-01-15 DIAGNOSIS — E119 Type 2 diabetes mellitus without complications: Secondary | ICD-10-CM | POA: Insufficient documentation

## 2017-01-15 DIAGNOSIS — R339 Retention of urine, unspecified: Secondary | ICD-10-CM | POA: Diagnosis not present

## 2017-01-15 DIAGNOSIS — Z87891 Personal history of nicotine dependence: Secondary | ICD-10-CM | POA: Diagnosis not present

## 2017-01-15 LAB — URINALYSIS, ROUTINE W REFLEX MICROSCOPIC
Bilirubin Urine: NEGATIVE
GLUCOSE, UA: NEGATIVE mg/dL
HGB URINE DIPSTICK: NEGATIVE
KETONES UR: 5 mg/dL — AB
LEUKOCYTES UA: NEGATIVE
Nitrite: NEGATIVE
PROTEIN: NEGATIVE mg/dL
Specific Gravity, Urine: 1.016 (ref 1.005–1.030)
pH: 7 (ref 5.0–8.0)
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
## 2017-01-15 NOTE — Discharge Instructions (Signed)
Return here if needed for problems. °

## 2017-01-15 NOTE — ED Notes (Signed)
Report called to [facility]  

## 2017-01-15 NOTE — ED Triage Notes (Addendum)
[facility]  Pt has not urinated since this am.  ABD tender to touch. Bladder scan showed 554 ml in bladder. VSS. Pt is nonverbal.

## 2017-01-15 NOTE — ED Notes (Signed)
Bladder scan showed 554 ml.

## 2017-02-28 ENCOUNTER — Inpatient Hospital Stay (HOSPITAL_COMMUNITY): Payer: Medicare Other

## 2017-02-28 ENCOUNTER — Inpatient Hospital Stay (HOSPITAL_COMMUNITY)
Admission: EM | Admit: 2017-02-28 | Discharge: 2017-03-07 | DRG: 871 | Disposition: A | Discharge status: Skilled Nursing Facility | Payer: Medicare Other | Attending: Internal Medicine | Admitting: Internal Medicine

## 2017-02-28 ENCOUNTER — Encounter (HOSPITAL_COMMUNITY): Payer: Self-pay | Admitting: Emergency Medicine

## 2017-02-28 ENCOUNTER — Emergency Department (HOSPITAL_COMMUNITY): Payer: Medicare Other

## 2017-02-28 DIAGNOSIS — Z947 Corneal transplant status: Secondary | ICD-10-CM

## 2017-02-28 DIAGNOSIS — R6521 Severe sepsis with septic shock: Secondary | ICD-10-CM | POA: Diagnosis present

## 2017-02-28 DIAGNOSIS — E1142 Type 2 diabetes mellitus with diabetic polyneuropathy: Secondary | ICD-10-CM | POA: Diagnosis present

## 2017-02-28 DIAGNOSIS — R569 Unspecified convulsions: Secondary | ICD-10-CM | POA: Diagnosis not present

## 2017-02-28 DIAGNOSIS — D696 Thrombocytopenia, unspecified: Secondary | ICD-10-CM | POA: Diagnosis present

## 2017-02-28 DIAGNOSIS — E872 Acidosis: Secondary | ICD-10-CM | POA: Diagnosis present

## 2017-02-28 DIAGNOSIS — D6489 Other specified anemias: Secondary | ICD-10-CM | POA: Diagnosis present

## 2017-02-28 DIAGNOSIS — E878 Other disorders of electrolyte and fluid balance, not elsewhere classified: Secondary | ICD-10-CM | POA: Diagnosis present

## 2017-02-28 DIAGNOSIS — Z931 Gastrostomy status: Secondary | ICD-10-CM

## 2017-02-28 DIAGNOSIS — Z87820 Personal history of traumatic brain injury: Secondary | ICD-10-CM

## 2017-02-28 DIAGNOSIS — J9601 Acute respiratory failure with hypoxia: Secondary | ICD-10-CM | POA: Diagnosis not present

## 2017-02-28 DIAGNOSIS — E876 Hypokalemia: Secondary | ICD-10-CM | POA: Diagnosis present

## 2017-02-28 DIAGNOSIS — L8990 Pressure ulcer of unspecified site, unspecified stage: Secondary | ICD-10-CM | POA: Insufficient documentation

## 2017-02-28 DIAGNOSIS — N390 Urinary tract infection, site not specified: Secondary | ICD-10-CM | POA: Diagnosis present

## 2017-02-28 DIAGNOSIS — E870 Hyperosmolality and hypernatremia: Secondary | ICD-10-CM | POA: Diagnosis present

## 2017-02-28 DIAGNOSIS — I10 Essential (primary) hypertension: Secondary | ICD-10-CM | POA: Diagnosis present

## 2017-02-28 DIAGNOSIS — Z87891 Personal history of nicotine dependence: Secondary | ICD-10-CM

## 2017-02-28 DIAGNOSIS — E785 Hyperlipidemia, unspecified: Secondary | ICD-10-CM | POA: Diagnosis present

## 2017-02-28 DIAGNOSIS — J690 Pneumonitis due to inhalation of food and vomit: Secondary | ICD-10-CM | POA: Diagnosis present

## 2017-02-28 DIAGNOSIS — J189 Pneumonia, unspecified organism: Secondary | ICD-10-CM

## 2017-02-28 DIAGNOSIS — R4182 Altered mental status, unspecified: Secondary | ICD-10-CM | POA: Diagnosis present

## 2017-02-28 DIAGNOSIS — A4159 Other Gram-negative sepsis: Principal | ICD-10-CM | POA: Diagnosis present

## 2017-02-28 DIAGNOSIS — R1310 Dysphagia, unspecified: Secondary | ICD-10-CM | POA: Diagnosis present

## 2017-02-28 DIAGNOSIS — A419 Sepsis, unspecified organism: Secondary | ICD-10-CM | POA: Diagnosis not present

## 2017-02-28 DIAGNOSIS — I69320 Aphasia following cerebral infarction: Secondary | ICD-10-CM

## 2017-02-28 DIAGNOSIS — N179 Acute kidney failure, unspecified: Secondary | ICD-10-CM | POA: Diagnosis present

## 2017-02-28 DIAGNOSIS — E7800 Pure hypercholesterolemia, unspecified: Secondary | ICD-10-CM | POA: Diagnosis present

## 2017-02-28 DIAGNOSIS — Z79891 Long term (current) use of opiate analgesic: Secondary | ICD-10-CM

## 2017-02-28 DIAGNOSIS — Z9049 Acquired absence of other specified parts of digestive tract: Secondary | ICD-10-CM

## 2017-02-28 DIAGNOSIS — E860 Dehydration: Secondary | ICD-10-CM | POA: Diagnosis present

## 2017-02-28 DIAGNOSIS — J9621 Acute and chronic respiratory failure with hypoxia: Secondary | ICD-10-CM | POA: Diagnosis present

## 2017-02-28 DIAGNOSIS — G9341 Metabolic encephalopathy: Secondary | ICD-10-CM | POA: Diagnosis present

## 2017-02-28 DIAGNOSIS — S069XAA Unspecified intracranial injury with loss of consciousness status unknown, initial encounter: Secondary | ICD-10-CM | POA: Diagnosis present

## 2017-02-28 DIAGNOSIS — L89159 Pressure ulcer of sacral region, unspecified stage: Secondary | ICD-10-CM | POA: Diagnosis not present

## 2017-02-28 DIAGNOSIS — S069X9A Unspecified intracranial injury with loss of consciousness of unspecified duration, initial encounter: Secondary | ICD-10-CM | POA: Diagnosis present

## 2017-02-28 DIAGNOSIS — L89152 Pressure ulcer of sacral region, stage 2: Secondary | ICD-10-CM | POA: Diagnosis present

## 2017-02-28 LAB — BLOOD CULTURE ID PANEL (REFLEXED)
Acinetobacter baumannii: NOT DETECTED
Candida albicans: NOT DETECTED
Candida glabrata: NOT DETECTED
Candida krusei: NOT DETECTED
Candida parapsilosis: NOT DETECTED
Candida tropicalis: NOT DETECTED
ENTEROCOCCUS SPECIES: NOT DETECTED
Enterobacter cloacae complex: NOT DETECTED
Enterobacteriaceae species: NOT DETECTED
Escherichia coli: NOT DETECTED
HAEMOPHILUS INFLUENZAE: NOT DETECTED
Klebsiella oxytoca: NOT DETECTED
Klebsiella pneumoniae: NOT DETECTED
Listeria monocytogenes: NOT DETECTED
NEISSERIA MENINGITIDIS: NOT DETECTED
PROTEUS SPECIES: NOT DETECTED
PSEUDOMONAS AERUGINOSA: NOT DETECTED
SERRATIA MARCESCENS: NOT DETECTED
STAPHYLOCOCCUS AUREUS BCID: NOT DETECTED
STAPHYLOCOCCUS SPECIES: NOT DETECTED
STREPTOCOCCUS AGALACTIAE: NOT DETECTED
STREPTOCOCCUS PNEUMONIAE: NOT DETECTED
STREPTOCOCCUS SPECIES: NOT DETECTED
Streptococcus pyogenes: NOT DETECTED

## 2017-02-28 LAB — I-STAT CHEM 8, ED
BUN: 39 mg/dL — AB (ref 6–20)
CHLORIDE: 106 mmol/L (ref 101–111)
CREATININE: 1.5 mg/dL — AB (ref 0.61–1.24)
Calcium, Ion: 1.1 mmol/L — ABNORMAL LOW (ref 1.15–1.40)
Glucose, Bld: 91 mg/dL (ref 65–99)
HEMATOCRIT: 36 % — AB (ref 39.0–52.0)
Hemoglobin: 12.2 g/dL — ABNORMAL LOW (ref 13.0–17.0)
POTASSIUM: 2.9 mmol/L — AB (ref 3.5–5.1)
SODIUM: 141 mmol/L (ref 135–145)
TCO2: 18 mmol/L — AB (ref 22–32)

## 2017-02-28 LAB — COMPREHENSIVE METABOLIC PANEL
ALBUMIN: 2.6 g/dL — AB (ref 3.5–5.0)
ALK PHOS: 115 U/L (ref 38–126)
ALT: 44 U/L (ref 17–63)
ANION GAP: 12 (ref 5–15)
AST: 45 U/L — ABNORMAL HIGH (ref 15–41)
BILIRUBIN TOTAL: 0.6 mg/dL (ref 0.3–1.2)
BUN: 43 mg/dL — ABNORMAL HIGH (ref 6–20)
CALCIUM: 8.1 mg/dL — AB (ref 8.9–10.3)
CO2: 19 mmol/L — AB (ref 22–32)
CREATININE: 1.72 mg/dL — AB (ref 0.61–1.24)
Chloride: 110 mmol/L (ref 101–111)
GFR calc Af Amer: 48 mL/min — ABNORMAL LOW (ref >60)
GFR calc non Af Amer: 42 mL/min — ABNORMAL LOW (ref >60)
GLUCOSE: 89 mg/dL (ref 65–99)
Potassium: 2.9 mmol/L — ABNORMAL LOW (ref 3.5–5.1)
SODIUM: 141 mmol/L (ref 135–145)
TOTAL PROTEIN: 4.9 g/dL — AB (ref 6.5–8.1)

## 2017-02-28 LAB — GLUCOSE, CAPILLARY
GLUCOSE-CAPILLARY: 120 mg/dL — AB (ref 65–99)
GLUCOSE-CAPILLARY: 127 mg/dL — AB (ref 65–99)
GLUCOSE-CAPILLARY: 137 mg/dL — AB (ref 65–99)
Glucose-Capillary: 79 mg/dL (ref 65–99)
Glucose-Capillary: 76 mg/dL (ref 65–99)

## 2017-02-28 LAB — I-STAT ARTERIAL BLOOD GAS, ED
Acid-base deficit: 12 mmol/L — ABNORMAL HIGH (ref 0.0–2.0)
BICARBONATE: 16.3 mmol/L — AB (ref 20.0–28.0)
O2 Saturation: 100 %
PCO2 ART: 43.3 mmHg (ref 32.0–48.0)
PH ART: 7.181 — AB (ref 7.350–7.450)
TCO2: 18 mmol/L — ABNORMAL LOW (ref 22–32)
pO2, Arterial: 395 mmHg — ABNORMAL HIGH (ref 83.0–108.0)

## 2017-02-28 LAB — CBC WITH DIFFERENTIAL/PLATELET
BASOS ABS: 0 10*3/uL (ref 0.0–0.1)
BASOS PCT: 0 %
Eosinophils Absolute: 0 10*3/uL (ref 0.0–0.7)
Eosinophils Relative: 0 %
HCT: 37.5 % — ABNORMAL LOW (ref 39.0–52.0)
HEMOGLOBIN: 11.9 g/dL — AB (ref 13.0–17.0)
LYMPHS ABS: 0.8 10*3/uL (ref 0.7–4.0)
Lymphocytes Relative: 3 %
MCH: 32.4 pg (ref 26.0–34.0)
MCHC: 31.7 g/dL (ref 30.0–36.0)
MCV: 102.2 fL — ABNORMAL HIGH (ref 78.0–100.0)
MONO ABS: 1.6 10*3/uL — AB (ref 0.1–1.0)
Monocytes Relative: 6 %
NEUTROS ABS: 24.8 10*3/uL — AB (ref 1.7–7.7)
Neutrophils Relative %: 91 %
PLATELETS: 73 10*3/uL — AB (ref 150–400)
RBC: 3.67 MIL/uL — ABNORMAL LOW (ref 4.22–5.81)
RDW: 13.3 % (ref 11.5–15.5)
WBC: 27.2 10*3/uL — ABNORMAL HIGH (ref 4.0–10.5)

## 2017-02-28 LAB — BLOOD GAS, ARTERIAL
ACID-BASE DEFICIT: 10.5 mmol/L — AB (ref 0.0–2.0)
Bicarbonate: 15.1 mmol/L — ABNORMAL LOW (ref 20.0–28.0)
DRAWN BY: 331001
FIO2: 40
O2 SAT: 96.8 %
PEEP/CPAP: 5 cmH2O
Patient temperature: 97.3
RATE: 28 resp/min
VT: 480 mL
pCO2 arterial: 33.1 mmHg (ref 32.0–48.0)
pH, Arterial: 7.277 — ABNORMAL LOW (ref 7.350–7.450)
pO2, Arterial: 92.3 mmHg (ref 83.0–108.0)

## 2017-02-28 LAB — BASIC METABOLIC PANEL
Anion gap: 7 (ref 5–15)
BUN: 38 mg/dL — ABNORMAL HIGH (ref 6–20)
CHLORIDE: 112 mmol/L — AB (ref 101–111)
CO2: 18 mmol/L — AB (ref 22–32)
Calcium: 7.7 mg/dL — ABNORMAL LOW (ref 8.9–10.3)
Creatinine, Ser: 1.27 mg/dL — ABNORMAL HIGH (ref 0.61–1.24)
GFR calc non Af Amer: >60 mL/min (ref >60)
Glucose, Bld: 152 mg/dL — ABNORMAL HIGH (ref 65–99)
POTASSIUM: 3.5 mmol/L (ref 3.5–5.1)
SODIUM: 137 mmol/L (ref 135–145)

## 2017-02-28 LAB — MRSA PCR SCREENING: MRSA by PCR: NEGATIVE

## 2017-02-28 LAB — URINALYSIS, ROUTINE W REFLEX MICROSCOPIC
Bilirubin Urine: NEGATIVE
GLUCOSE, UA: NEGATIVE mg/dL
KETONES UR: 5 mg/dL — AB
Nitrite: NEGATIVE
PH: 7 (ref 5.0–8.0)
Protein, ur: 100 mg/dL — AB
Specific Gravity, Urine: 1.02 (ref 1.005–1.030)

## 2017-02-28 LAB — I-STAT TROPONIN, ED: Troponin i, poc: 0.1 ng/mL (ref 0.00–0.08)

## 2017-02-28 LAB — PHOSPHORUS
PHOSPHORUS: 2.4 mg/dL — AB (ref 2.5–4.6)
PHOSPHORUS: 1.2 mg/dL — AB (ref 2.5–4.6)

## 2017-02-28 LAB — MAGNESIUM
Magnesium: 1.6 mg/dL — ABNORMAL LOW (ref 1.7–2.4)
Magnesium: 1.7 mg/dL (ref 1.7–2.4)

## 2017-02-28 LAB — I-STAT CG4 LACTIC ACID, ED: Lactic Acid, Venous: 4.88 mmol/L (ref 0.5–1.9)

## 2017-02-28 LAB — AMMONIA: AMMONIA: 113 umol/L — AB (ref 9–35)

## 2017-02-28 LAB — TRIGLYCERIDES: Triglycerides: 139 mg/dL (ref <150)

## 2017-02-28 LAB — LACTIC ACID, PLASMA
LACTIC ACID, VENOUS: 3.5 mmol/L — AB (ref 0.5–1.9)
Lactic Acid, Venous: 5.5 mmol/L (ref 0.5–1.9)

## 2017-02-28 LAB — CK: Total CK: 36 U/L — ABNORMAL LOW (ref 49–397)

## 2017-02-28 LAB — HIV ANTIBODY (ROUTINE TESTING W REFLEX): HIV Screen 4th Generation wRfx: NONREACTIVE

## 2017-02-28 MED ORDER — ACETAMINOPHEN 325 MG PO TABS: 650.0000 mg | ORAL_TABLET | ORAL | Status: DC | PRN

## 2017-02-28 MED ORDER — ETOMIDATE 2 MG/ML IV SOLN
INTRAVENOUS | Status: AC | PRN
  Administered 2017-02-28: 20 mg via INTRAVENOUS

## 2017-02-28 MED ORDER — ROCURONIUM BROMIDE 50 MG/5ML IV SOLN
INTRAVENOUS | Status: AC | PRN
  Administered 2017-02-28: 100 mg via INTRAVENOUS

## 2017-02-28 MED ORDER — ASPIRIN 81 MG PO CHEW: 324.0000 mg | CHEWABLE_TABLET | ORAL | Status: AC

## 2017-02-28 MED ORDER — LACOSAMIDE 50 MG PO TABS
100.0000 mg | ORAL_TABLET | Freq: Two times a day (BID) | ORAL | Status: DC
  Administered 2017-02-28 – 2017-03-07 (×15): 100 mg
  Filled 2017-02-28 (×15): qty 2

## 2017-02-28 MED ORDER — DEXTROSE 5 % IV SOLN
2.0000 g | INTRAVENOUS | Status: DC
  Administered 2017-02-28 – 2017-03-03 (×4): 2 g via INTRAVENOUS
  Filled 2017-02-28 (×4): qty 2

## 2017-02-28 MED ORDER — POTASSIUM CHLORIDE 10 MEQ/100ML IV SOLN
10.0000 meq | Freq: Once | INTRAVENOUS | Status: AC
  Administered 2017-02-28: 10 meq via INTRAVENOUS
  Filled 2017-02-28: qty 100

## 2017-02-28 MED ORDER — ASPIRIN 300 MG RE SUPP
300.0000 mg | RECTAL | Status: AC
  Administered 2017-02-28: 300 mg via RECTAL
  Filled 2017-02-28: qty 1

## 2017-02-28 MED ORDER — PANTOPRAZOLE SODIUM 40 MG IV SOLR
40.0000 mg | Freq: Every day | INTRAVENOUS | Status: DC
  Administered 2017-02-28 – 2017-03-01 (×2): 40 mg via INTRAVENOUS
  Filled 2017-02-28 (×2): qty 40

## 2017-02-28 MED ORDER — SODIUM CHLORIDE 0.9 % IV SOLN
INTRAVENOUS | Status: AC | PRN
  Administered 2017-02-28: 1000 mL via INTRAVENOUS

## 2017-02-28 MED ORDER — NOREPINEPHRINE BITARTRATE 1 MG/ML IV SOLN
0.0000 ug/min | INTRAVENOUS | Status: DC
  Administered 2017-02-28: 10 ug/min via INTRAVENOUS
  Administered 2017-02-28: 25 ug/min via INTRAVENOUS
  Administered 2017-02-28: 10 ug/min via INTRAVENOUS
  Filled 2017-02-28 (×4): qty 4

## 2017-02-28 MED ORDER — PROPOFOL 1000 MG/100ML IV EMUL
0.0000 ug/kg/min | INTRAVENOUS | Status: DC
  Administered 2017-02-28: 40 ug/kg/min via INTRAVENOUS
  Administered 2017-02-28: 30 ug/kg/min via INTRAVENOUS
  Filled 2017-02-28 (×3): qty 100

## 2017-02-28 MED ORDER — LEVOFLOXACIN IN D5W 750 MG/150ML IV SOLN
750.0000 mg | Freq: Once | INTRAVENOUS | Status: AC
  Administered 2017-02-28: 750 mg via INTRAVENOUS
  Filled 2017-02-28: qty 150

## 2017-02-28 MED ORDER — SODIUM CHLORIDE 0.9 % IV BOLUS (SEPSIS): 250.0000 mL | Freq: Once | INTRAVENOUS | Status: DC

## 2017-02-28 MED ORDER — OXCARBAZEPINE 300 MG/5ML PO SUSP
600.0000 mg | Freq: Every day | ORAL | Status: DC
  Administered 2017-02-28 – 2017-03-06 (×7): 600 mg
  Filled 2017-02-28 (×7): qty 10

## 2017-02-28 MED ORDER — DEXTROSE 5 % IV SOLN
2.0000 g | Freq: Once | INTRAVENOUS | Status: AC
  Administered 2017-02-28: 2 g via INTRAVENOUS
  Filled 2017-02-28: qty 2

## 2017-02-28 MED ORDER — VITAL AF 1.2 CAL PO LIQD
1000.0000 mL | ORAL | Status: AC
  Administered 2017-03-01: 1000 mL

## 2017-02-28 MED ORDER — LEVETIRACETAM 100 MG/ML PO SOLN
250.0000 mg | Freq: Two times a day (BID) | ORAL | Status: DC
  Administered 2017-02-28 – 2017-03-07 (×15): 250 mg
  Filled 2017-02-28 (×15): qty 2.5

## 2017-02-28 MED ORDER — NOREPINEPHRINE BITARTRATE 1 MG/ML IV SOLN
0.0000 ug/min | INTRAVENOUS | Status: DC
  Administered 2017-03-01: 25 ug/min via INTRAVENOUS
  Administered 2017-03-01: 23 ug/min via INTRAVENOUS
  Filled 2017-02-28 (×2): qty 16

## 2017-02-28 MED ORDER — TOPIRAMATE 100 MG PO TABS
100.0000 mg | ORAL_TABLET | Freq: Two times a day (BID) | ORAL | Status: DC
  Administered 2017-02-28 – 2017-03-07 (×15): 100 mg
  Filled 2017-02-28: qty 1
  Filled 2017-02-28: qty 4
  Filled 2017-02-28 (×2): qty 1
  Filled 2017-02-28 (×2): qty 4
  Filled 2017-02-28 (×2): qty 1
  Filled 2017-02-28: qty 4
  Filled 2017-02-28 (×3): qty 1
  Filled 2017-02-28 (×3): qty 4

## 2017-02-28 MED ORDER — DEXTROSE 5 % IV SOLN
2.0000 g | Freq: Three times a day (TID) | INTRAVENOUS | Status: DC
  Filled 2017-02-28: qty 2

## 2017-02-28 MED ORDER — FENTANYL CITRATE (PF) 100 MCG/2ML IJ SOLN
100.0000 ug | INTRAMUSCULAR | Status: DC | PRN
  Administered 2017-02-28: 100 ug via INTRAVENOUS
  Filled 2017-02-28: qty 2

## 2017-02-28 MED ORDER — HEPARIN SODIUM (PORCINE) 5000 UNIT/ML IJ SOLN
5000.0000 [IU] | Freq: Three times a day (TID) | INTRAMUSCULAR | Status: DC
  Administered 2017-02-28 – 2017-03-07 (×18): 5000 [IU] via SUBCUTANEOUS
  Filled 2017-02-28 (×20): qty 1

## 2017-02-28 MED ORDER — SODIUM CHLORIDE 0.9 % IV SOLN: 250.0000 mL | INTRAVENOUS | Status: DC | PRN

## 2017-02-28 MED ORDER — POTASSIUM CHLORIDE 20 MEQ/15ML (10%) PO SOLN
40.0000 meq | Freq: Once | ORAL | Status: AC
  Administered 2017-02-28: 40 meq
  Filled 2017-02-28: qty 30

## 2017-02-28 MED ORDER — SODIUM CHLORIDE 0.9 % IV BOLUS (SEPSIS)
1000.0000 mL | Freq: Once | INTRAVENOUS | Status: AC
  Administered 2017-02-28: 1000 mL via INTRAVENOUS

## 2017-02-28 MED ORDER — INSULIN ASPART 100 UNIT/ML ~~LOC~~ SOLN
1.0000 [IU] | SUBCUTANEOUS | Status: DC
  Administered 2017-02-28 – 2017-03-01 (×4): 1 [IU] via SUBCUTANEOUS

## 2017-02-28 MED ORDER — FENTANYL CITRATE (PF) 100 MCG/2ML IJ SOLN: 100.0000 ug | INTRAMUSCULAR | Status: DC | PRN

## 2017-02-28 MED ORDER — PRO-STAT SUGAR FREE PO LIQD
30.0000 mL | Freq: Two times a day (BID) | ORAL | Status: DC
  Administered 2017-02-28: 30 mL
  Filled 2017-02-28: qty 30

## 2017-02-28 MED ORDER — ORAL CARE MOUTH RINSE
15.0000 mL | Freq: Four times a day (QID) | OROMUCOSAL | Status: DC
  Administered 2017-02-28 – 2017-03-01 (×4): 15 mL via OROMUCOSAL

## 2017-02-28 MED ORDER — SODIUM CHLORIDE 0.9 % IV BOLUS (SEPSIS): 1000.0000 mL | Freq: Once | INTRAVENOUS | Status: DC

## 2017-02-28 MED ORDER — DOCUSATE SODIUM 50 MG/5ML PO LIQD: 100.0000 mg | Freq: Two times a day (BID) | ORAL | Status: DC | PRN

## 2017-02-28 MED ORDER — VANCOMYCIN HCL IN DEXTROSE 750-5 MG/150ML-% IV SOLN
750.0000 mg | Freq: Two times a day (BID) | INTRAVENOUS | Status: DC
  Administered 2017-02-28: 750 mg via INTRAVENOUS
  Filled 2017-02-28 (×2): qty 150

## 2017-02-28 MED ORDER — NOREPINEPHRINE BITARTRATE 1 MG/ML IV SOLN
0.0000 ug/min | Freq: Once | INTRAVENOUS | Status: AC
  Administered 2017-02-28: 10 ug/min via INTRAVENOUS
  Filled 2017-02-28: qty 4

## 2017-02-28 MED ORDER — VITAL HIGH PROTEIN PO LIQD
1000.0000 mL | ORAL | Status: DC
  Administered 2017-02-28: 1000 mL

## 2017-02-28 MED ORDER — ONDANSETRON HCL 4 MG/2ML IJ SOLN: 4.0000 mg | Freq: Four times a day (QID) | INTRAMUSCULAR | Status: DC | PRN

## 2017-02-28 MED ORDER — VANCOMYCIN HCL IN DEXTROSE 750-5 MG/150ML-% IV SOLN
750.0000 mg | Freq: Two times a day (BID) | INTRAVENOUS | Status: DC
  Filled 2017-02-28: qty 150

## 2017-02-28 MED ORDER — CHLORHEXIDINE GLUCONATE 0.12% ORAL RINSE (MEDLINE KIT)
15.0000 mL | Freq: Two times a day (BID) | OROMUCOSAL | Status: DC
  Administered 2017-02-28 – 2017-03-01 (×2): 15 mL via OROMUCOSAL

## 2017-02-28 MED ORDER — PROPOFOL 1000 MG/100ML IV EMUL
INTRAVENOUS | Status: AC
  Administered 2017-02-28: 15 ug/kg/min
  Filled 2017-02-28: qty 100

## 2017-02-28 MED ORDER — VANCOMYCIN HCL IN DEXTROSE 1-5 GM/200ML-% IV SOLN
1000.0000 mg | Freq: Once | INTRAVENOUS | Status: AC
  Administered 2017-02-28: 1000 mg via INTRAVENOUS
  Filled 2017-02-28: qty 200

## 2017-02-28 NOTE — Progress Notes (Signed)
[facility] ICU attending   Subjective: Admitted overnight Has baseline TBI, seizure history and lives in a SNF Unresponsive for 2 days prior to admission Brought to ER, noted to be hypotensive Acidemic Hypokalemic Lactic acid still up  Objective: Vitals:   02/28/17 0550 02/28/17 0700 02/28/17 0800 02/28/17 0830  BP: 113/70 109/79 122/83 (!) 113/97  Pulse: (!) 109 (!) 102  (!) 127  Resp: (!) 22 (!) 22 (!) 23 (!) 29  Temp:   (!) 97.3 F (36.3 C)   TempSrc:   Axillary   SpO2: 99% 100% 100% 100%  Weight:      Height:       Vent Mode: PRVC FiO2 (%):  [40 %] 40 % Set Rate:  [22 bmp-28 bmp] 28 bmp Vt Set:  [480 mL-640 mL] 480 mL PEEP:  [5 cmH20] 5 cmH20 Plateau Pressure:  [18 cmH20] 18 cmH20  Intake/Output Summary (Last 24 hours) at 02/28/17 0906 Last data filed at 02/28/17 0800  Gross per 24 hour  Intake          5272.93 ml  Output                0 ml  Net          5272.93 ml    General:  In bed on vent HENT: NCAT ETT in place PULM: Crackles Left base, vent supported breathing CV: RRR, no mgr GI: BS+, soft, nontender MSK: normal bulk and tone Neuro: sleepy but will wake to voice, not following commands    CBC    Component Value Date/Time   WBC 27.2 (H) 02/28/2017 0411   RBC 3.67 (L) 02/28/2017 0411   HGB 11.9 (L) 02/28/2017 0411   HGB 13.9 07/22/2015 1114   HCT 37.5 (L) 02/28/2017 0411   HCT 43.4 07/22/2015 1114   PLT 73 (L) 02/28/2017 0411   PLT 216 07/22/2015 1114   MCV 102.2 (H) 02/28/2017 0411   MCV 96 07/22/2015 1114   MCH 32.4 02/28/2017 0411   MCHC 31.7 02/28/2017 0411   RDW 13.3 02/28/2017 0411   RDW 13.8 07/22/2015 1114   LYMPHSABS 0.8 02/28/2017 0411   LYMPHSABS 1.9 07/22/2015 1114   MONOABS 1.6 (H) 02/28/2017 0411   EOSABS 0.0 02/28/2017 0411   EOSABS 0.4 07/22/2015 1114   BASOSABS 0.0 02/28/2017 0411   BASOSABS 0.0 07/22/2015 1114    BMET    Component Value Date/Time   NA 141 02/28/2017 0411   NA 140 07/22/2015 1114   K 2.9 (L)  02/28/2017 0411   CL 110 02/28/2017 0411   CO2 19 (L) 02/28/2017 0411   GLUCOSE 89 02/28/2017 0411   BUN 43 (H) 02/28/2017 0411   BUN 22 07/22/2015 1114   CREATININE 1.72 (H) 02/28/2017 0411   CALCIUM 8.1 (L) 02/28/2017 0411   GFRNONAA 42 (L) 02/28/2017 0411   GFRAA 48 (L) 02/28/2017 0411    CXR images independently reviewed, bilateral L>R infiltrates, ETT in place  CT head: NAICP   Impression/Plan: Septic shock: repeat lactic acid, continue levophed to maintain MAP > 65, check CVP  HCAP: has tolerated cefepime in the past, change to this now from aztreonam, give Vanc NOW Seizure baseline with history of TBI: add back home oxcarbazepime, continue topamax, keppra, and vimpat Acute respiratory failure with hypoxemia: continue full vent support, repeat ABG now, continue high RR for nwo AKI: follow UOP closely, may need more fluid based on CVP Metabolic acidosis: check ABG now, repeat BMET Hypokalemia: replace K, repeat  BMET later Nutrition: start tube feeding   My cc time 35 minutes  Heber Palo Cedro, MD [place] [facility] Pager: [phone] Cell: [phone] After 3pm or if no response, call [phone]

## 2017-02-28 NOTE — H&P (Signed)
PULMONARY / CRITICAL CARE MEDICINE   Name: Alexander [name] MRN: [id number] DOB: 03/14/1956    ADMISSION DATE:  02/28/2017 CONSULTATION DATE:  8/29  REFERRING MD:  Dr. Wilkie Aye EDP  CHIEF COMPLAINT:  AMS  HISTORY OF PRESENT ILLNESS:   61 year old male with PMH as below, which is significant for TBI in 1996, supranuclear palsy, seizure, HTN, DM, and CVA. At baseline he is aphasic and very limited, but is able to communicate with staff at [facility]). In the early AM hours 8/29 he was transported to [facility] ED with complaints of unresponsiveness x 2 days. Upon arrival to ED GCS noted to be 3 and he was profoundly hypotensive with BP 60/40. He was intubated of airway protection. Hypotension was refractory to 5 L NS and he started on norepinephrine. PCCM asked to admit.   PAST MEDICAL HISTORY :  He  has a past medical history of Arthritis; Aspiration pneumonia (HCC); C1 cervical fracture (HCC) (10/09/2012); Cervical myelopathy (HCC) (10/09/2012); CNS degenerative disease; CVA (cerebral infarction); Depression (10/09/2012); Diabetes mellitus; Gout; HCAP (healthcare-associated pneumonia); Headache(784.0); Hypercholesterolemia; Hypertension; Keratoconus; Low back pain radiating to left leg; Muscle weakness; Peripheral neuropathy; Progressive supranuclear ophthalmoplegia (HCC); Seizure (HCC); Supranuclear palsy (HCC); and TBI (traumatic brain injury) (HCC) (1996).  PAST SURGICAL HISTORY: He  has a past surgical history that includes Fracture surgery; PEG tube placement (2008); Shoulder arthroscopy w/ rotator cuff repair (Left, X2); Corneal transplant (Left, 2003); Cholecystectomy (N/A, February, 2014); Cardiac catheterization (2002); and ORIF radial head / neck fracture.  Allergies  Allergen Reactions  . Amoxicillin Itching, Rash and Other (See Comments)    NOTED ON MAR Has patient had a PCN reaction causing immediate rash, facial/tongue/throat swelling, SOB or lightheadedness  with hypotension: Unknown Has patient had a PCN reaction causing severe rash involving mucus membranes or skin necrosis: Unknown Has patient had a PCN reaction that required hospitalization Unknown Has patient had a PCN reaction occurring within the last 10 years: Unknown If all of the above answers are "NO", then may proceed with Cephalosporin use.     No current facility-administered medications on file prior to encounter.    Current Outpatient Prescriptions on File Prior to Encounter  Medication Sig  . acetaminophen (TYLENOL) 650 MG CR tablet Take 650 mg by mouth every 4 (four) hours as needed for pain.   Marland Kitchen allopurinol (ZYLOPRIM) 300 MG tablet 300 mg by PEG Tube route daily.   . carboxymethylcellulose (REFRESH PLUS) 0.5 % SOLN Place 1 drop into both eyes daily.   Marland Kitchen esomeprazole (NEXIUM) 40 MG packet 40 mg daily before breakfast. Per tube  . fentaNYL (DURAGESIC - DOSED MCG/HR) 75 MCG/HR Place 75 mcg onto the skin every 3 (three) days.  . finasteride (PROSCAR) 5 MG tablet 5 mg daily. Per tube  . fluticasone (FLONASE) 50 MCG/ACT nasal spray Place 1 spray into both nostrils daily.   Marland Kitchen gabapentin (NEURONTIN) 300 MG capsule Place 300 mg into feeding tube 3 (three) times daily.   . Hyprom-Naphaz-Polysorb-Zn Sulf (CLEAR EYES COMPLETE OP) Place 1 drop into both eyes daily.  . Lacosamide (VIMPAT) 100 MG TABS Take one tablet by mouth via tube twice daily for seizures (Patient taking differently: Give 100 mg by tube 2 (two) times daily. )  . levETIRAcetam (KEPPRA) 100 MG/ML solution Place 250 mg into feeding tube 2 (two) times daily.   . Melatonin 10 MG TABS Give 10 mg by tube at bedtime.   . Menthol, Topical Analgesic, (BIOFREEZE  EX) Apply 1 application topically every 8 (eight) hours.  . Menthol-Methyl Salicylate (ICY HOT EXTRA STRENGTH) 10-30 % CREA Apply topically 3 (three) times daily. Applied to left shoulder  . Oxcarbazepine (TRILEPTAL) 300 MG tablet Place 600 mg into feeding tube at bedtime.    Marland Kitchen. Phenylephrine-APAP-Guaifenesin (MUCINEX FAST-MAX) 10-650-400 MG/20ML LIQD Place 15 mLs into feeding tube every 12 (twelve) hours.  . polyethylene glycol (MIRALAX / GLYCOLAX) packet Place 17 g into feeding tube daily. Mix with 8 oz water and place in tube every night at bedtime (Patient taking differently: Place 17 g into feeding tube daily. )  . topiramate (TOPAMAX) 100 MG tablet Take 1 tablet (100 mg total) by mouth 2 (two) times daily. PEG tube (Patient taking differently: Place 100 mg into feeding tube 2 (two) times daily. )  . traZODone (DESYREL) 100 MG tablet 100 mg by PEG Tube route at bedtime.   Marland Kitchen. venlafaxine (EFFEXOR) 100 MG tablet 300 mg by PEG Tube route 3 (three) times daily.   . Vitamin D, Ergocalciferol, (DRISDOL) 50000 units CAPS capsule Take 50,000 Units by mouth every 30 (thirty) days.  . baclofen (LIORESAL) 10 MG tablet Place 10 mg into feeding tube every 12 (twelve) hours.  . busPIRone (BUSPAR) 7.5 MG tablet Place 7.5 mg into feeding tube 2 (two) times daily.  . celecoxib (CELEBREX) 200 MG capsule Place 200 mg into feeding tube daily.   Marland Kitchen. ipratropium-albuterol (DUONEB) 0.5-2.5 (3) MG/3ML SOLN Take 3 mLs by nebulization every 6 (six) hours as needed (for shortness of breath).  . lidocaine (XYLOCAINE) 5 % ointment Apply 1 application topically daily. Applied around G-Tube  . nitroGLYCERIN (NITROSTAT) 0.4 MG SL tablet Place 0.4 mg under the tongue every 5 (five) minutes as needed for chest pain.  . Nutritional Supplements (ISOSOURCE 1.5 CAL) LIQD Place 105 mL/hr into feeding tube See admin instructions. Run for 13 hours on at 6am and off at 7am.  . Oxycodone HCl 10 MG TABS Take one tablet by mouth every 6 hours as needed for pain (Patient taking differently: Place 10 mg into feeding tube every 6 (six) hours as needed (For pain.). )    FAMILY HISTORY:  His indicated that his mother is alive. He indicated that his father is deceased. He indicated that the status of his brother is  unknown.    SOCIAL HISTORY: He  reports that he has quit smoking. He has never used smokeless tobacco. He reports that he does not drink alcohol or use drugs.  REVIEW OF SYSTEMS:   Unable as patient is encephalopathic and intubated  SUBJECTIVE:    VITAL SIGNS: BP 113/64   Pulse (!) 104   Temp 99.6 F (37.6 C) (Rectal)   Resp (!) 22   Ht 5\' 9"  (1.753 m)   Wt 70.3 kg (155 lb)   SpO2 100%   BMI 22.89 kg/m   HEMODYNAMICS:    VENTILATOR SETTINGS:    INTAKE / OUTPUT: No intake/output data recorded.  PHYSICAL EXAMINATION: General:  Frail, cachectic, male. appears older than stated age. On vent Neuro:  sedated HEENT:  [place]/AT, PERRL, no JVD. L eye with artificial lens.  Cardiovascular:  Tachy, regular Lungs:  Coarse bilateral Abdomen:  Soft, non-distended Musculoskeletal:  No acute deformity, widespread muscle atrophy.  Skin:  Grossly intact  LABS:  BMET  Recent Labs Lab 02/28/17 0407  NA 141  K 2.9*  CL 106  BUN 39*  CREATININE 1.50*  GLUCOSE 91    Electrolytes No results for input(s):  CALCIUM, MG, PHOS in the last 168 hours.  CBC  Recent Labs Lab 02/28/17 0407  HGB 12.2*  HCT 36.0*    Coag's No results for input(s): APTT, INR in the last 168 hours.  Sepsis Markers  Recent Labs Lab 02/28/17 0407  LATICACIDVEN 4.88*    ABG  Recent Labs Lab 02/28/17 0440  PHART 7.181*  PCO2ART 43.3  PO2ART 395.0*    Liver Enzymes No results for input(s): AST, ALT, ALKPHOS, BILITOT, ALBUMIN in the last 168 hours.  Cardiac Enzymes No results for input(s): TROPONINI, PROBNP in the last 168 hours.  Glucose No results for input(s): GLUCAP in the last 168 hours.  Imaging Dg Chest Portable 1 View  Result Date: 02/28/2017 CLINICAL DATA:  Respiratory failure.  Intubation EXAM: PORTABLE CHEST 1 VIEW COMPARISON:  None. FINDINGS: Endotracheal tube is 2.8 cm above the carina. Orogastric tube extends well into the stomach and beyond the inferior edge of  the image. Patchy lung base opacities, left greater than right. No large pneumothorax or large effusion. IMPRESSION: 1. Satisfactorily positioned support equipment. 2. Patchy lung base opacities, left greater than right. Electronically Signed   By: Ellery Plunk M.D.   On: 02/28/2017 04:40     STUDIES:    CULTURES: Blood 8/29 > Urine 8/29> Sputum 8/29 >  ANTIBIOTICS: Aztreonam 8/29 > Vancomycin 8/29 >  SIGNIFICANT EVENTS: 8/29 admit on vent to ICU. Pressors  LINES/TUBES: ETT 8/29 > CVL 8/29 >  DISCUSSION: 61 year old disabled male living in SNF. Presented to ED after 2 days unresponsiveness  ASSESSMENT / PLAN:  PULMONARY A: Acute hypoxemic respiratory failure ? HCAP  P:   Full vent support ABG CXR VAP bundle ABX as above  CARDIOVASCULAR A:  Septic shock H/o HTN, HLD  P:  Telemetry monitoring in ICU Norepinephrine for MAP goal > Place CVL CVP monitoring Ensure lactic acid clearing Echocardiogram Cycle troponin  RENAL A:   AKI Hypokalemia Lactic acidosis  P:   Await official BMP (istat only resulted so far) S/p 5 L NS resuscitation KVO CK  GASTROINTESTINAL A:   PEG status  P:   NPO Protonix  HEMATOLOGIC A:   No acute issues  P:  CBC pending Coags Sq heparin for VTE ppx  INFECTIOUS A:   Sepsis. CAP vs UTI chronic foley  P:   ABX as above Trend WBC and fever curve  ENDOCRINE A:   DM  P:   CBG monitoring and SSI  NEUROLOGIC A:   Acute metabolic encephalopathy history of seizures   P:   RASS goal: 0 Continue home Keppra, topamax PRN fentanyl/versed CT head pending   FAMILY  - Updates: No family available  - Inter-disciplinary family meet or Palliative Care meeting due by:  9/5  Joneen Roach, AGACNP-BC [place] [facility] Pager [phone] or [phone]  02/28/2017 5:10 AM

## 2017-02-28 NOTE — ED Notes (Signed)
Pt in CT with RN and RT

## 2017-02-28 NOTE — Progress Notes (Signed)
PHARMACY - PHYSICIAN COMMUNICATION CRITICAL VALUE ALERT - BLOOD CULTURE IDENTIFICATION (BCID)  Results for orders placed or performed during the hospital encounter of 02/28/17  Blood Culture ID Panel (Reflexed) (Collected: 02/28/2017  4:05 AM)  Result Value Ref Range   Enterococcus species NOT DETECTED NOT DETECTED   Listeria monocytogenes NOT DETECTED NOT DETECTED   Staphylococcus species NOT DETECTED NOT DETECTED   Staphylococcus aureus NOT DETECTED NOT DETECTED   Streptococcus species NOT DETECTED NOT DETECTED   Streptococcus agalactiae NOT DETECTED NOT DETECTED   Streptococcus pneumoniae NOT DETECTED NOT DETECTED   Streptococcus pyogenes NOT DETECTED NOT DETECTED   Acinetobacter baumannii NOT DETECTED NOT DETECTED   Enterobacteriaceae species NOT DETECTED NOT DETECTED   Enterobacter cloacae complex NOT DETECTED NOT DETECTED   Escherichia coli NOT DETECTED NOT DETECTED   Klebsiella oxytoca NOT DETECTED NOT DETECTED   Klebsiella pneumoniae NOT DETECTED NOT DETECTED   Proteus species NOT DETECTED NOT DETECTED   Serratia marcescens NOT DETECTED NOT DETECTED   Haemophilus influenzae NOT DETECTED NOT DETECTED   Neisseria meningitidis NOT DETECTED NOT DETECTED   Pseudomonas aeruginosa NOT DETECTED NOT DETECTED   Candida albicans NOT DETECTED NOT DETECTED   Candida glabrata NOT DETECTED NOT DETECTED   Candida krusei NOT DETECTED NOT DETECTED   Candida parapsilosis NOT DETECTED NOT DETECTED   Candida tropicalis NOT DETECTED NOT DETECTED    Name of physician (or Provider) Contacted:  Dr. Molli KnockYacoub -Gram stain GNR.  BCID none detected.  Currently on Cefepime and Vancomycin Changes to prescribed antibiotics required:  No change  Alexander [name], RPh Clinical Pharmacist Pager: [phone] 02/28/2017  9:58 PM

## 2017-02-28 NOTE — Progress Notes (Signed)
Pharmacy Antibiotic Note  Alexander [name] is a 61 y.o. male admitted on 02/28/2017 with pneumonia.  Pharmacy has been consulted for vancomycin and cefepime dosing. Patient was given levaquin and aztreonam in the ED, due to documented amoxicillin allergy. I reviewed patient antibiotic history and noted he has tolerated zosyn and cefepime in the past. Aztreonam and levaquin have been discontinued and cefepime added to vancomycin for adequate HCAP coverage at this time. The WBC count is elevated at 27.9, LA is also elevated at 5.5. Creatinine is elevated at 1.72, but still has adequate urine output at this time.   Plan: Vancomycin 1000mg  IV x 1 loading dose  Continue Vancomycin 750mg  IV q 12 maintenance dose Goal trough 15-20 Cefepime 2g IV q24  Continue to monitor cultures, renal function, clinical progression, vancomycin troughs as indicated  Height: 6\' 1"  (185.4 cm) Weight: 155 lb (70.3 kg) IBW/kg (Calculated) : 79.9  Temp (24hrs), Avg:98.5 F (36.9 C), Min:97.3 F (36.3 C), Max:99.6 F (37.6 C)   Recent Labs Lab 02/28/17 0407 02/28/17 0411 02/28/17 0549  WBC  --  27.2*  --   CREATININE 1.50* 1.72*  --   LATICACIDVEN 4.88*  --  5.5*    Estimated Creatinine Clearance: 46 mL/min (A) (by C-G formula based on SCr of 1.72 mg/dL (H)).    Allergies  Allergen Reactions  . Amoxicillin Itching, Rash and Other (See Comments)    NOTED ON MAR Has patient had a PCN reaction causing immediate rash, facial/tongue/throat swelling, SOB or lightheadedness with hypotension: Unknown Has patient had a PCN reaction causing severe rash involving mucus membranes or skin necrosis: Unknown Has patient had a PCN reaction that required hospitalization Unknown Has patient had a PCN reaction occurring within the last 10 years: Unknown If all of the above answers are "NO", then may proceed with Cephalosporin use.    Antibiotics this admission: Aztreonam 8/29 >>8/29 Vancomycin 8/29 >> Levaquin x 1 dose  in ED  Cefepime 8/29>>  Cultures: Blood 8/29 > Urine 8/29> Sputum 8/29 > 8/29 MRSA PCR: negative  Thank you for allowing pharmacy to be a part of this patient's care.  Blake Divine, Pharm.D. PGY1 Pharmacy Resident 02/28/2017 9:26 AM Main Pharmacy: [phone]

## 2017-02-28 NOTE — ED Notes (Signed)
PCCM at bedside. °

## 2017-02-28 NOTE — ED Notes (Signed)
Paul PCCM at bedside to start CVC
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
## 2017-02-28 NOTE — Progress Notes (Signed)
[facility] pt was being assisted with his breathing by bagging, pt intubated by MD and placed on vent.  ABG obtained vent changes made accordingly by MD.  RT will continue to monitor.

## 2017-02-28 NOTE — ED Triage Notes (Addendum)
Pt [facility] for ALOC/Unresponsiveness x 2 days; pt arrives with assisted ventilations via BVM; EMS reports SNF staff states "he should have been sent out yesterday"; EMS reports ST elevation in V2 and V3, initial BP was 60/40 and 2400cc NS given enroute, 66% on NRB before replaced with BVM which brought O2 sat to 100% with EtCO2 30-35; GCS remained a 3 with EMS for entire transport; pt currently getting 3rd liter NS on arrival to TRA B

## 2017-02-28 NOTE — Sedation Documentation (Signed)
Preoxygenation for intubation at this time

## 2017-02-28 NOTE — Procedures (Signed)
Central Venous Catheter Insertion Procedure Note Alexander [name] [id number] Jul 05, 1955  Procedure: Insertion of Central Venous Catheter Indications: Drug and/or fluid administration  Procedure Details Consent: Unable to obtain consent because of emergent medical necessity. Time Out: Verified patient identification, verified procedure, site/side was marked, verified correct patient position, special equipment/implants available, medications/allergies/relevent history reviewed, required imaging and test results available.  Performed  Maximum sterile technique was used including antiseptics, cap, gloves, gown, hand hygiene, mask and sheet. Skin prep: Chlorhexidine; local anesthetic administered A antimicrobial bonded/coated triple lumen catheter was placed in the left internal jugular vein using the Seldinger technique. Ultrasound guidance used.Yes.   Catheter placed to 20 cm. Blood aspirated via all 3 ports and then flushed x 3. Line sutured x 2 and dressing applied.  Evaluation Blood flow good Complications: No apparent complications Patient did tolerate procedure well. Chest X-ray ordered to verify placement.  CXR: pending.  Joneen RoachPaul Charlise Giovanetti, AGACNP-BC [facility]/Critical Care Pager [phone] or [phone]  02/28/2017 6:12 AM

## 2017-02-28 NOTE — Progress Notes (Addendum)
Initial Nutrition Assessment  DOCUMENTATION CODES:   Severe malnutrition in context of chronic illness  INTERVENTION:    Vital AF 1.2 at 60 ml/h (1440 ml per day)  Provides 1728 kcal (2061 total with Propofol), 108 gm protein, 1168 ml free water daily  Monitor magnesium, potassium, and phosphorus daily for at least 3 days, MD to replete as needed, as pt is at risk for refeeding syndrome given severe PCM with low phosphorus and magnesium.  NUTRITION DIAGNOSIS:   Malnutrition (severe) related to chronic illness (CNS degenerative disease) as evidenced by severe depletion of muscle mass, severe depletion of body fat.  GOAL:   Patient will meet greater than or equal to 90% of their needs  MONITOR:   Vent status, TF tolerance, Skin, Labs, I & O's  REASON FOR ASSESSMENT:   Consult Enteral/tube feeding initiation and management  ASSESSMENT:   61 yo male with PMH of DM, HTN, seizure, HLD, CNS degenerative disease, arthritis, keratoconis, supranuclear palsy, CVA, aspiration PNA, TBI who was admitted on 8/29 with hypotensive, hypokalemia, unresponsive x 2 days PTA, requiring intubation.  Discussed patient in ICU rounds and with RN today. Received MD Consult for TF initiation and management. Nutrition-Focused physical exam completed. Findings are severe fat depletion, severe muscle depletion, and no edema.  Patient is currently intubated on ventilator support MV: 13.2 L/min Temp (24hrs), Avg:98.4 F (36.9 C), Min:97.3 F (36.3 C), Max:99.6 F (37.6 C)  Propofol: 12.6 ml/hr providing 333 kcal/day Labs reviewed: phosphorus 2.4 (L), magnesium 1.6 (L) CBG's: 127-120 Medications reviewed and include propofol.  Diet Order:  Diet NPO time specified Except for: Sips with Meds  Skin:  Wound (see comment) (stage 1 sacrum)  Last BM:  unknown  Height:   Ht Readings from Last 1 Encounters:  02/28/17 6\' 1"  (1.854 m)    Weight:   Wt Readings from Last 1 Encounters:  02/28/17  155 lb (70.3 kg)    Ideal Body Weight:  83.6 kg  BMI:  Body mass index is 20.45 kg/m.  Estimated Nutritional Needs:   Kcal:  1990  Protein:  100-115 gm  Fluid:  2 L  EDUCATION NEEDS:   No education needs identified at this time  Joaquin CourtsKimberly Donnette Macmullen, RD, LDN, CNSC Pager [phone] After Hours Pager [phone]

## 2017-02-28 NOTE — Progress Notes (Signed)
Pharmacy Antibiotic Note  Alexander [name] is a 61 y.o. male admitted on 02/28/2017 with pneumonia.  Pharmacy has been consulted for Vancocin and Azactam dosing.  Plan: Rec'd vanc 1g, Azactam 2g, and Levaquin 750mg  IV in ED. Vancomycin 750mg  IV every 12 hours.  Goal trough 15-20 mcg/mL.  Aztreonam 2g IV every 8 hours.  Height: 6\' 1"  (185.4 cm) Weight: 155 lb (70.3 kg) IBW/kg (Calculated) : 79.9  Temp (24hrs), Avg:99.6 F (37.6 C), Min:99.6 F (37.6 C), Max:99.6 F (37.6 C)   Recent Labs Lab 02/28/17 0407  CREATININE 1.50*  LATICACIDVEN 4.88*    Estimated Creatinine Clearance: 52.7 mL/min (A) (by C-G formula based on SCr of 1.5 mg/dL (H)).    Allergies  Allergen Reactions  . Amoxicillin Itching, Rash and Other (See Comments)    NOTED ON MAR Has patient had a PCN reaction causing immediate rash, facial/tongue/throat swelling, SOB or lightheadedness with hypotension: Unknown Has patient had a PCN reaction causing severe rash involving mucus membranes or skin necrosis: Unknown Has patient had a PCN reaction that required hospitalization Unknown Has patient had a PCN reaction occurring within the last 10 years: Unknown If all of the above answers are "NO", then may proceed with Cephalosporin use.     Thank you for allowing pharmacy to be a part of this patient's care.  Vernard Gambles, PharmD, BCPS  02/28/2017 5:16 AM

## 2017-02-28 NOTE — ED Notes (Signed)
30mcg/hr fentanyl patch removed

## 2017-03-01 ENCOUNTER — Inpatient Hospital Stay (HOSPITAL_COMMUNITY): Payer: Medicare Other

## 2017-03-01 DIAGNOSIS — J9601 Acute respiratory failure with hypoxia: Secondary | ICD-10-CM

## 2017-03-01 DIAGNOSIS — J189 Pneumonia, unspecified organism: Secondary | ICD-10-CM

## 2017-03-01 DIAGNOSIS — L8990 Pressure ulcer of unspecified site, unspecified stage: Secondary | ICD-10-CM | POA: Insufficient documentation

## 2017-03-01 DIAGNOSIS — A419 Sepsis, unspecified organism: Secondary | ICD-10-CM

## 2017-03-01 DIAGNOSIS — R6521 Severe sepsis with septic shock: Secondary | ICD-10-CM

## 2017-03-01 LAB — CBC WITH DIFFERENTIAL/PLATELET
BASOS PCT: 0 %
Basophils Absolute: 0 10*3/uL (ref 0.0–0.1)
EOS ABS: 0 10*3/uL (ref 0.0–0.7)
Eosinophils Relative: 0 %
HCT: 38.9 % — ABNORMAL LOW (ref 39.0–52.0)
Hemoglobin: 12.8 g/dL — ABNORMAL LOW (ref 13.0–17.0)
LYMPHS ABS: 0.7 10*3/uL (ref 0.7–4.0)
LYMPHS PCT: 3 %
MCH: 32.6 pg (ref 26.0–34.0)
MCHC: 32.9 g/dL (ref 30.0–36.0)
MCV: 99 fL (ref 78.0–100.0)
MONO ABS: 0.9 10*3/uL (ref 0.1–1.0)
Monocytes Relative: 4 %
NEUTROS ABS: 20.3 10*3/uL — AB (ref 1.7–7.7)
Neutrophils Relative %: 93 %
Platelets: 78 10*3/uL — ABNORMAL LOW (ref 150–400)
RBC: 3.93 MIL/uL — ABNORMAL LOW (ref 4.22–5.81)
RDW: 13.5 % (ref 11.5–15.5)
WBC MORPHOLOGY: INCREASED
WBC: 21.9 10*3/uL — ABNORMAL HIGH (ref 4.0–10.5)

## 2017-03-01 LAB — POCT I-STAT 3, ART BLOOD GAS (G3+)
Acid-base deficit: 6 mmol/L — ABNORMAL HIGH (ref 0.0–2.0)
Bicarbonate: 18.1 mmol/L — ABNORMAL LOW (ref 20.0–28.0)
O2 Saturation: 95 %
PH ART: 7.356 (ref 7.350–7.450)
TCO2: 19 mmol/L — ABNORMAL LOW (ref 22–32)
pCO2 arterial: 32.4 mmHg (ref 32.0–48.0)
pO2, Arterial: 77 mmHg — ABNORMAL LOW (ref 83.0–108.0)

## 2017-03-01 LAB — BASIC METABOLIC PANEL
ANION GAP: 8 (ref 5–15)
BUN: 39 mg/dL — AB (ref 6–20)
CALCIUM: 8.4 mg/dL — AB (ref 8.9–10.3)
CO2: 19 mmol/L — ABNORMAL LOW (ref 22–32)
CREATININE: 1.25 mg/dL — AB (ref 0.61–1.24)
Chloride: 111 mmol/L (ref 101–111)
Glucose, Bld: 110 mg/dL — ABNORMAL HIGH (ref 65–99)
POTASSIUM: 3.3 mmol/L — AB (ref 3.5–5.1)
Sodium: 138 mmol/L (ref 135–145)

## 2017-03-01 LAB — GLUCOSE, CAPILLARY
GLUCOSE-CAPILLARY: 95 mg/dL (ref 65–99)
GLUCOSE-CAPILLARY: 97 mg/dL (ref 65–99)
GLUCOSE-CAPILLARY: 134 mg/dL — AB (ref 65–99)
GLUCOSE-CAPILLARY: 126 mg/dL — AB (ref 65–99)
Glucose-Capillary: 141 mg/dL — ABNORMAL HIGH (ref 65–99)
Glucose-Capillary: 114 mg/dL — ABNORMAL HIGH (ref 65–99)

## 2017-03-01 LAB — MAGNESIUM: MAGNESIUM: 1.8 mg/dL (ref 1.7–2.4)

## 2017-03-01 LAB — PHOSPHORUS: Phosphorus: 1.3 mg/dL — ABNORMAL LOW (ref 2.5–4.6)

## 2017-03-01 MED ORDER — ORAL CARE MOUTH RINSE
15.0000 mL | Freq: Two times a day (BID) | OROMUCOSAL | Status: DC
  Administered 2017-03-01 – 2017-03-07 (×11): 15 mL via OROMUCOSAL

## 2017-03-01 MED ORDER — SODIUM CHLORIDE 0.9 % IV BOLUS (SEPSIS)
1000.0000 mL | Freq: Once | INTRAVENOUS | Status: AC
  Administered 2017-03-01: 1000 mL via INTRAVENOUS

## 2017-03-01 MED ORDER — POTASSIUM CHLORIDE 10 MEQ/50ML IV SOLN
10.0000 meq | INTRAVENOUS | Status: AC
  Administered 2017-03-01 (×2): 10 meq via INTRAVENOUS
  Filled 2017-03-01 (×2): qty 50

## 2017-03-01 MED ORDER — CHLORHEXIDINE GLUCONATE 0.12 % MT SOLN
15.0000 mL | Freq: Two times a day (BID) | OROMUCOSAL | Status: DC
  Administered 2017-03-01 – 2017-03-07 (×13): 15 mL via OROMUCOSAL
  Filled 2017-03-01 (×7): qty 15

## 2017-03-01 NOTE — Procedures (Signed)
Extubation Procedure Note  Patient Details:   Name: Alexander [name] DOB: 1955-07-06 MRN: [id number]   Airway Documentation:     Evaluation  O2 sats: stable throughout Complications: No apparent complications Patient did tolerate procedure well. Bilateral Breath Sounds: Clear   No   Patient tolerated wean. MD ordered to extubate. Positive for cuff leak. Patient extubated to a 3 Lpm nasal cannula. No signs of dyspnea or stridor. Patient unable to perform Incentive Spirometer after being instructed how to use. RN at bedside.   Ancil BoozerSmallwood, Mukesh Kornegay 03/01/2017, 10:06 AM

## 2017-03-01 NOTE — Progress Notes (Signed)
PULMONARY / CRITICAL CARE MEDICINE   Name: Alexander [name] MRN: [id number] DOB: 4Loreen Freud/13/1957    ADMISSION DATE:  02/28/2017 CONSULTATION DATE:  8/29  REFERRING MD:  Dr. Wilkie AyeHorton EDP  CHIEF COMPLAINT:  AMS  BRIEF: 61 y/o male with a history of TBI admitted for unresponsiveness and hypotension presumably in the setting of aspiration pneumonia.    SUBJECTIVE:  No acute events Remains on vasopressors  VITAL SIGNS: BP 106/69 (BP Location: Right Arm)   Pulse (!) 113   Temp (!) 100.8 F (38.2 C) (Axillary) Comment: RN Jacquenette ShoneJulian aware   Resp (!) 28   Ht 6\' 1"  (1.854 m)   Wt 71 kg (156 lb 8.4 oz)   SpO2 99%   BMI 20.65 kg/m   HEMODYNAMICS: CVP:  [3 mmHg-9 mmHg] 3 mmHg  VENTILATOR SETTINGS: Vent Mode: PSV;CPAP FiO2 (%):  [40 %] 40 % Set Rate:  [28 bmp] 28 bmp Vt Set:  [480 mL] 480 mL PEEP:  [5 cmH20] 5 cmH20 Pressure Support:  [5 cmH20] 5 cmH20 Plateau Pressure:  [16 cmH20-22 cmH20] 22 cmH20  INTAKE / OUTPUT: I/O last 3 completed shifts: In: 6908.5 [I.V.:6369.9; NG/GT:288.7; IV Piggyback:250] Out: 1190 [Urine:1190]  PHYSICAL EXAMINATION:  General:  In bed on vent HENT: NCAT ETT in place PULM: CTA B, vent supported breathing CV: RRR, no mgr GI: BS+, soft, nontender MSK: normal bulk and tone Neuro: sedated on vent    LABS:  BMET  Recent Labs Lab 02/28/17 0411 02/28/17 1200 03/01/17 0420  NA 141 137 138  K 2.9* 3.5 3.3*  CL 110 112* 111  CO2 19* 18* 19*  BUN 43* 38* 39*  CREATININE 1.72* 1.27* 1.25*  GLUCOSE 89 152* 110*    Electrolytes  Recent Labs Lab 02/28/17 0411 02/28/17 0918 02/28/17 1200 02/28/17 2250 03/01/17 0420  CALCIUM 8.1*  --  7.7*  --  8.4*  MG  --  1.6*  --  1.7 1.8  PHOS  --  2.4*  --  1.2* 1.3*    CBC  Recent Labs Lab 02/28/17 0407 02/28/17 0411 03/01/17 0420  WBC  --  27.2* 21.9*  HGB 12.2* 11.9* 12.8*  HCT 36.0* 37.5* 38.9*  PLT  --  73* 78*    Coag's No results for input(s): APTT, INR in the last 168  hours.  Sepsis Markers  Recent Labs Lab 02/28/17 0407 02/28/17 0549 02/28/17 0801  LATICACIDVEN 4.88* 5.5* 3.5*    ABG  Recent Labs Lab 02/28/17 0440 02/28/17 0930 03/01/17 0417  PHART 7.181* 7.277* 7.356  PCO2ART 43.3 33.1 32.4  PO2ART 395.0* 92.3 77.0*    Liver Enzymes  Recent Labs Lab 02/28/17 0411  AST 45*  ALT 44  ALKPHOS 115  BILITOT 0.6  ALBUMIN 2.6*    Cardiac Enzymes No results for input(s): TROPONINI, PROBNP in the last 168 hours.  Glucose  Recent Labs Lab 02/28/17 1224 02/28/17 1609 02/28/17 2027 02/28/17 2343 03/01/17 0414 03/01/17 0827  GLUCAP 120* 79 76 137* 95 97    Imaging Dg Chest Port 1 View  Result Date: 03/01/2017 CLINICAL DATA:  Respiratory failure. EXAM: PORTABLE CHEST 1 VIEW COMPARISON:  02/28/2017. FINDINGS: Endotracheal tube, left IJ line, NG tube in stable position. Heart size normal. Progressive basilar atelectasis. Progressive left base infiltrate. Small bilateral pleural effusions. Scratched it tiny bilateral pleural effusions. No pneumothorax. Surgical clips right upper quadrant. IMPRESSION: 1. Lines and tubes in stable position. 2. Progressive bibasilar atelectasis. Progressive left base infiltrate. Electronically Signed   By: Maisie Fushomas  Register   On: 03/01/2017 06:40     STUDIES:    CULTURES: Blood 8/29 > GNR 1/2 Urine 8/29> Sputum 8/29 >  ANTIBIOTICS: Aztreonam 8/29 > 8/29 Cefepim 8/29 >  Vancomycin 8/29 > 8/30  SIGNIFICANT EVENTS: 8/29 admit on vent to ICU. Pressors  LINES/TUBES: ETT 8/29 > CVL 8/29 >  DISCUSSION: 61 year old disabled male living in SNF. Presented to ED after 2 days unresponsiveness  ASSESSMENT / PLAN:  PULMONARY A: Acute respiratory failure with hypoxemia Aspiration pneumonia  P:   Full mechanical vent support VAP prevention Daily WUA/SBT   CARDIOVASCULAR A:  Septic shock > remains in shock H/o HTN, HLD  P:  Tele Wean off levophed for MAP > 65 Stop propofol,  hopefully this will help BP  RENAL A:   AKI > improving Hypokalemia  P:   Replace K now Monitor BMET and UOP Replace electrolytes as needed   GASTROINTESTINAL A:   PEG status  P:   Continue tube feeding Stress ulcer prophylaxis  HEMATOLOGIC A:   No acute issues Thrombocytopenia without bleeding P:  Monitor for bleeding Continue sub q hep  INFECTIOUS A:   Aspiration pneumonia UTI P:   Send Urine culture Stop vanc  ENDOCRINE A:   DM  P:   Continue CBG monitoring  NEUROLOGIC A:   Acute metabolic encephalopathy history of seizures  Chronic narcotic use at home P:   RASS goal 0 Stop propofol Continue home anti-epileptics Change to prn fentanyl  FAMILY  - Updates: none bedside today  - Inter-disciplinary family meet or Palliative Care meeting due by:  9/5  My cc time 31 minutes  Heber Guilford, MD [facility] Pager: [phone] Cell: [phone] After 3pm or if no response, call [phone]   03/01/2017 8:47 AM

## 2017-03-01 NOTE — Progress Notes (Signed)
[facility] REPLACEMENT ONLY  The [facility] based on the criteria listed below:   1. Is GFR >/= 40 ml/min? Yes.    Patient's GFR today is >60 2. Is urine output >/= 0.5 ml/kg/hr for the last 6 hours? Yes.   Patient's UOP is 1.07 ml/kg/hr 3. Is BUN < 60 mg/dL? Yes.    Patient's BUN today is 39 4. Abnormal electrolyte(s): 3.3 5. Ordered repletion with:per protocol 6. If a panic level lab has been reported, has the CCM MD in charge been notified? Yes.  .   Physician:  Dr. Storm FriskSimmonds  River Ambrosio, Dixon BoosMaria Samson 03/01/2017 5:48 AM

## 2017-03-02 DIAGNOSIS — J9601 Acute respiratory failure with hypoxia: Secondary | ICD-10-CM

## 2017-03-02 LAB — BASIC METABOLIC PANEL
ANION GAP: 5 (ref 5–15)
Anion gap: 5 (ref 5–15)
BUN: 37 mg/dL — AB (ref 6–20)
BUN: 36 mg/dL — ABNORMAL HIGH (ref 6–20)
CHLORIDE: 118 mmol/L — AB (ref 101–111)
CO2: 21 mmol/L — ABNORMAL LOW (ref 22–32)
CO2: 22 mmol/L (ref 22–32)
Calcium: 8.2 mg/dL — ABNORMAL LOW (ref 8.9–10.3)
Calcium: 8.5 mg/dL — ABNORMAL LOW (ref 8.9–10.3)
Chloride: 117 mmol/L — ABNORMAL HIGH (ref 101–111)
Creatinine, Ser: 1.09 mg/dL (ref 0.61–1.24)
Creatinine, Ser: 0.96 mg/dL (ref 0.61–1.24)
GLUCOSE: 130 mg/dL — AB (ref 65–99)
Glucose, Bld: 144 mg/dL — ABNORMAL HIGH (ref 65–99)
Potassium: 2.7 mmol/L — CL (ref 3.5–5.1)
Potassium: 3.6 mmol/L (ref 3.5–5.1)
SODIUM: 143 mmol/L (ref 135–145)
Sodium: 145 mmol/L (ref 135–145)

## 2017-03-02 LAB — GLUCOSE, CAPILLARY
GLUCOSE-CAPILLARY: 130 mg/dL — AB (ref 65–99)
GLUCOSE-CAPILLARY: 119 mg/dL — AB (ref 65–99)
GLUCOSE-CAPILLARY: 117 mg/dL — AB (ref 65–99)
GLUCOSE-CAPILLARY: 108 mg/dL — AB (ref 65–99)
GLUCOSE-CAPILLARY: 103 mg/dL — AB (ref 65–99)
Glucose-Capillary: 99 mg/dL (ref 65–99)

## 2017-03-02 LAB — CULTURE, BLOOD (ROUTINE X 2): SPECIAL REQUESTS: ADEQUATE

## 2017-03-02 LAB — URINE CULTURE: Culture: <10000 — AB

## 2017-03-02 LAB — CORTISOL: Cortisol, Plasma: 9.6 ug/dL

## 2017-03-02 MED ORDER — MAGNESIUM SULFATE 2 GM/50ML IV SOLN
2.0000 g | Freq: Once | INTRAVENOUS | Status: AC
  Administered 2017-03-02: 2 g via INTRAVENOUS
  Filled 2017-03-02: qty 50

## 2017-03-02 MED ORDER — GLUCERNA 1.2 CAL PO LIQD
1000.0000 mL | ORAL | Status: DC
  Administered 2017-03-02 – 2017-03-03 (×2): 1000 mL
  Filled 2017-03-02 (×6): qty 1000

## 2017-03-02 MED ORDER — ACETAMINOPHEN 160 MG/5ML PO SOLN
650.0000 mg | ORAL | Status: DC | PRN
  Administered 2017-03-02 – 2017-03-05 (×5): 650 mg
  Filled 2017-03-02 (×5): qty 20.3

## 2017-03-02 MED ORDER — PANTOPRAZOLE SODIUM 40 MG PO PACK
40.0000 mg | PACK | Freq: Every day | ORAL | Status: DC
  Administered 2017-03-02 – 2017-03-07 (×6): 40 mg
  Filled 2017-03-02 (×6): qty 20

## 2017-03-02 MED ORDER — LACTATED RINGERS IV BOLUS (SEPSIS)
750.0000 mL | Freq: Once | INTRAVENOUS | Status: AC
  Administered 2017-03-02: 750 mL via INTRAVENOUS

## 2017-03-02 MED ORDER — LACTATED RINGERS IV SOLN
INTRAVENOUS | Status: DC
  Administered 2017-03-02 (×2): via INTRAVENOUS

## 2017-03-02 MED ORDER — FENTANYL 50 MCG/HR TD PT72
50.0000 ug | MEDICATED_PATCH | TRANSDERMAL | Status: DC
  Administered 2017-03-02 – 2017-03-05 (×2): 50 ug via TRANSDERMAL
  Filled 2017-03-02 (×2): qty 1

## 2017-03-02 MED ORDER — OXYCODONE HCL 5 MG PO TABS
5.0000 mg | ORAL_TABLET | Freq: Four times a day (QID) | ORAL | Status: DC | PRN
  Administered 2017-03-02 – 2017-03-04 (×4): 5 mg via ORAL
  Filled 2017-03-02 (×4): qty 1

## 2017-03-02 MED ORDER — POTASSIUM CHLORIDE 20 MEQ/15ML (10%) PO SOLN
40.0000 meq | Freq: Once | ORAL | Status: AC
  Administered 2017-03-02: 40 meq
  Filled 2017-03-02: qty 30

## 2017-03-02 MED ORDER — POTASSIUM CHLORIDE 20 MEQ/15ML (10%) PO SOLN
40.0000 meq | ORAL | Status: AC
  Administered 2017-03-02 (×2): 40 meq
  Filled 2017-03-02 (×2): qty 30

## 2017-03-02 NOTE — Progress Notes (Signed)
[facility] REPLACEMENT ONLY  The patient does [facility] based on the criteria listed below:   1. Is GFR >/= 40 ml/min? Yes.    Patient's GFR today is >60 2. Is urine output >/= 0.5 ml/kg/hr for the last 6 hours? Yes.   Patient's UOP is 0.619ml/kg/hr 3. Is BUN < 60 mg/dL? Yes.    Patient's BUN today is 37 4. Abnormal electrolyte(s): 2.7 5. Ordered repletion with: per protocol 6. If a panic level lab has been reported, has the CCM MD in charge been notified? Yes.  .   Physician:  Dr. Mendel Corningramaswamy  Lougenia Morrissey, Dixon BoosMaria Samson 03/02/2017 5:32 AM

## 2017-03-02 NOTE — Progress Notes (Signed)
Nutrition Follow-up  DOCUMENTATION CODES:   Severe malnutrition in context of chronic illness  INTERVENTION:   Continue TF via PEG:  Change formula to Glucerna 1.2 at 70 ml/h  Provides 2016 kcal, 101 gm protein, 1354 ml free water daily  NUTRITION DIAGNOSIS:   Malnutrition (severe) related to chronic illness (CNS degenerative disease) as evidenced by severe depletion of muscle mass, severe depletion of body fat.  Ongoing  GOAL:   Patient will meet greater than or equal to 90% of their needs  Met with TF  MONITOR:   TF tolerance, Skin, Labs, I & O's  ASSESSMENT:   61 yo male with PMH of DM, HTN, seizure, HLD, CNS degenerative disease, arthritis, keratoconis, supranuclear palsy, CVA, aspiration PNA, TBI who was admitted on 8/29 with hypotensive, hypokalemia, unresponsive x 2 days PTA, requiring intubation.  Patient was extubated this morning. Receiving TF via PEG: Vital AF 1.2 at 60 ml/h to provide 1728 kcal, 108 gm protein, 1168 ml free water daily. Will change TF to a standard formula. Labs and medications reviewed. Potassium 2.7 (L), being replaced CBG's: [phone]  Diet Order:   NPO  Skin:  Wound (see comment) (stage 1 sacrum)  Last BM:  8/30 (type 6)  Height:   Ht Readings from Last 1 Encounters:  02/28/17 6' 1"  (1.854 m)    Weight:   Wt Readings from Last 1 Encounters:  03/02/17 164 lb 3.9 oz (74.5 kg)    Ideal Body Weight:  83.6 kg  BMI:  Body mass index is 21.67 kg/m.  Estimated Nutritional Needs:   Kcal:  2000-2200  Protein:  95-110 gm  Fluid:  2-2.2 L  EDUCATION NEEDS:   No education needs identified at this time  Molli Barrows, [place], [place], [place] Pager [phone] After Hours Pager [phone]

## 2017-03-02 NOTE — Progress Notes (Signed)
PT Cancellation Note  Patient Details Name: Loreen FreudMartin A Hufford MRN: [id number] DOB: 1955/08/07   Cancelled Treatment:    Reason Eval/Treat Not Completed: PT screened, no needs identified, will sign off. Pt long term resident of nursing home. Spoke with pt's brother and mother. Pt is dependent with all mobility at facility. Reports staff with physically lifts him into chair or uses a mechanical lift. Recommend mechanical lift for OOB.   Angelina OkCary W Maycok 03/02/2017, 10:09 AM  Skip Mayerary Rithy Mandley PT [phone]

## 2017-03-02 NOTE — Progress Notes (Signed)
PULMONARY / CRITICAL CARE MEDICINE   Name: Alexander [name] MRN: [id number] DOB: [birth year]    ADMISSION DATE:  02/28/2017 CONSULTATION DATE:  8/29  REFERRING MD:  Dr. Wilkie Aye EDP  CHIEF COMPLAINT:  AMS  BRIEF: 61 y/o male with a history of TBI admitted for unresponsiveness and hypotension presumably in the setting of aspiration pneumonia.    SUBJECTIVE:  Still on pressors but weaning More awake  VITAL SIGNS: BP 103/60 (BP Location: Right Arm)   Pulse (!) 118   Temp 98 F (36.7 C) (Axillary)   Resp (!) 30   Ht 6\' 1"  (1.854 m)   Wt 164 lb 3.9 oz (74.5 kg)   SpO2 95%   BMI 21.67 kg/m   HEMODYNAMICS: CVP:  [2 mmHg-4 mmHg] 4 mmHg  VENTILATOR SETTINGS:    INTAKE / OUTPUT: I/O last 3 completed shifts: In: 3853.3 [I.V.:1270.3; Other:30; NG/GT:1253; IV Piggyback:1300] Out: 2550 [Urine:2550]  PHYSICAL EXAMINATION: General appearance: chronically ill appearing 61 Year old  Male NAD awake will f/c remains aphasic  Eyes: anicteric sclerae, moist conjunctivae; PERRL, EOMI bilaterally. Mouth:  membranes are dry  normal hard and soft palate Neck: Trachea midline; neck supple, no JVD Lungs/chest: diffuse rhonchi, with normal respiratory effort and no intercostal retractions CV: RRR, no MRGs  Abdomen: Soft, non-tender; no masses or HSM Extremities: some peripheral edema Skin: Normal temperature, turgor and texture; no rash, ulcers or subcutaneous nodules Psych: awake aphasic. Follows commands  LABS:  BMET  Recent Labs Lab 02/28/17 1200 03/01/17 0420 03/02/17 0416  NA 137 138 143  K 3.5 3.3* 2.7*  CL 112* 111 117*  CO2 18* 19* 21*  BUN 38* 39* 37*  CREATININE 1.27* 1.25* 1.09  GLUCOSE 152* 110* 144*    Electrolytes  Recent Labs Lab 02/28/17 0918 02/28/17 1200 02/28/17 2250 03/01/17 0420 03/02/17 0416  CALCIUM  --  7.7*  --  8.4* 8.2*  MG 1.6*  --  1.7 1.8  --   PHOS 2.4*  --  1.2* 1.3*  --     CBC  Recent Labs Lab 02/28/17 0407 02/28/17 0411  03/01/17 0420  WBC  --  27.2* 21.9*  HGB 12.2* 11.9* 12.8*  HCT 36.0* 37.5* 38.9*  PLT  --  73* 78*    Coag's No results for input(s): APTT, INR in the last 168 hours.  Sepsis Markers  Recent Labs Lab 02/28/17 0407 02/28/17 0549 02/28/17 0801  LATICACIDVEN 4.88* 5.5* 3.5*    ABG  Recent Labs Lab 02/28/17 0440 02/28/17 0930 03/01/17 0417  PHART 7.181* 7.277* 7.356  PCO2ART 43.3 33.1 32.4  PO2ART 395.0* 92.3 77.0*    Liver Enzymes  Recent Labs Lab 02/28/17 0411  AST 45*  ALT 44  ALKPHOS 115  BILITOT 0.6  ALBUMIN 2.6*    Cardiac Enzymes No results for input(s): TROPONINI, PROBNP in the last 168 hours.  Glucose  Recent Labs Lab 03/01/17 1103 03/01/17 1634 03/01/17 2007 03/01/17 2326 03/02/17 0306 03/02/17 0724  GLUCAP 134* 141* 114* 126* 130* 119*    Imaging No results found.   STUDIES:    CULTURES: Blood 8/29 >Providencia Stuartii Urine 8/29>in sig growth  Sputum 8/29 >  ANTIBIOTICS: Aztreonam 8/29 > 8/29 Cefepim 8/29 >  Vancomycin 8/29 > 8/30  SIGNIFICANT EVENTS: 8/29 admit on vent to ICU. Pressors  LINES/TUBES: ETT 8/29 > CVL 8/29 >  DISCUSSION: 61 year old disabled male living in SNF. Presented to ED after 2 days unresponsiveness  ASSESSMENT / PLAN:  Sepsis/septic shock in  setting of Aspiration pneumonia, UTI & GNR bacteremia (Providencia Stuartii Sensitive to maxipime) Has h/o HTN ->still pressor dependent but CVP is 4 Plan Day 3/8 cefepime Fluid bolus -->if not off pressors by this afternoon add midodrine Ck cortisol -->low threshold for stress dose steroids  Respiratory failure d/t aspiration/HCAP Possible central sleep apnea  ->PCXR review from 8/30: LLL airspace disease. Looked a little worse. Also has RLL atx.  ->nursing reports desaturation to mid 80s when asleep Plan Cont wean o2 Cont pulse ox May need nocturnal oxygen  AKI > improving Hypokalemia & hypomagnesemia  Plan Replace K & Mg Repeat chem in  am Cont MIVFs  Acute metabolic encephalopathy superimposed on h/o TBI w subnu clear palsy and prior CVA  w/ associated aphasia Plan RAS 0 Supportive care w/ ADL assist.  Eventually back to SNF  Thrombocytopenia without bleeding P:  Trend cbc [place] heparin   history of seizures  Plan Cont AEDs  Chronic narcotic use Plan Resume duragesic patch w/ PRN oxycodone   Dysphagia w/ PEG status Plan Cont tubefeeds PPI  DM Plan Cont ssi    FAMILY  - Updates: none bedside today  - Inter-disciplinary family meet or Palliative Care meeting due by:  9/5  Simonne MartinetPeter E Babcock ACNP-BC [facility] Pager # [phone] OR # [phone] if no answer  03/02/2017 10:24 AM  Attending:  I have seen and examined the patient with nurse practitioner/resident and agree with the note above.  We formulated the plan together and I elicited the following history.    Remains off vent Still on vasopressors  Vitals:   03/02/17 1030 03/02/17 1135  BP: 110/78   Pulse:    Resp: (!) 32   Temp:  (!) 97.3 F (36.3 C)  SpO2: 98%    General:  Chronically ill appearing, resting comfortably in bed HENT: NCAT OP clear PULM: few rhonchi bilaterally, normal effort CV: RRR, no mgr GI: BS+, soft, nontender MSK: normal bulk and tone Neuro: awake, alert, no distress, MAEW   CBC    Component Value Date/Time   WBC 21.9 (H) 03/01/2017 0420   RBC 3.93 (L) 03/01/2017 0420   HGB 12.8 (L) 03/01/2017 0420   HGB 13.9 07/22/2015 1114   HCT 38.9 (L) 03/01/2017 0420   HCT 43.4 07/22/2015 1114   PLT 78 (L) 03/01/2017 0420   PLT 216 07/22/2015 1114   MCV 99.0 03/01/2017 0420   MCV 96 07/22/2015 1114   MCH 32.6 03/01/2017 0420   MCHC 32.9 03/01/2017 0420   RDW 13.5 03/01/2017 0420   RDW 13.8 07/22/2015 1114   LYMPHSABS 0.7 03/01/2017 0420   LYMPHSABS 1.9 07/22/2015 1114   MONOABS 0.9 03/01/2017 0420   EOSABS 0.0 03/01/2017 0420   EOSABS 0.4 07/22/2015 1114   BASOSABS 0.0 03/01/2017 0420    BASOSABS 0.0 07/22/2015 1114   BMET    Component Value Date/Time   NA 143 03/02/2017 0416   NA 140 07/22/2015 1114   K 2.7 (LL) 03/02/2017 0416   CL 117 (H) 03/02/2017 0416   CO2 21 (L) 03/02/2017 0416   GLUCOSE 144 (H) 03/02/2017 0416   BUN 37 (H) 03/02/2017 0416   BUN 22 07/22/2015 1114   CREATININE 1.09 03/02/2017 0416   CALCIUM 8.2 (L) 03/02/2017 0416   GFRNONAA >60 03/02/2017 0416   GFRAA >60 03/02/2017 0416    Impression/Plan Septic shock: slow to wean levophed, not sure why, agree with checking cortisol, give more saline as CVP only 4, if no improvement  start midodrine Nutrition: per PEG UTI and Pneumonia: stop meropenem, start Levaquin, follow culture  My cc time 30 minutes  Heber [place], MD [place] P[facility]M Pager: [phone] Cell: [phone] After 3pm or if no response, call [phone]

## 2017-03-03 ENCOUNTER — Inpatient Hospital Stay (HOSPITAL_COMMUNITY): Payer: Medicare Other

## 2017-03-03 LAB — CBC WITH DIFFERENTIAL/PLATELET
BASOS ABS: 0 10*3/uL (ref 0.0–0.1)
Basophils Relative: 0 %
EOS ABS: 0.2 10*3/uL (ref 0.0–0.7)
EOS PCT: 3 %
HCT: 30.2 % — ABNORMAL LOW (ref 39.0–52.0)
Hemoglobin: 9.7 g/dL — ABNORMAL LOW (ref 13.0–17.0)
Lymphocytes Relative: 11 %
Lymphs Abs: 0.9 10*3/uL (ref 0.7–4.0)
MCH: 31 pg (ref 26.0–34.0)
MCHC: 32.1 g/dL (ref 30.0–36.0)
MCV: 96.5 fL (ref 78.0–100.0)
MONO ABS: 0.5 10*3/uL (ref 0.1–1.0)
Monocytes Relative: 6 %
Neutro Abs: 6 10*3/uL (ref 1.7–7.7)
Neutrophils Relative %: 79 %
PLATELETS: 53 10*3/uL — AB (ref 150–400)
RBC: 3.13 MIL/uL — ABNORMAL LOW (ref 4.22–5.81)
RDW: 13.7 % (ref 11.5–15.5)
WBC: 7.5 10*3/uL (ref 4.0–10.5)

## 2017-03-03 LAB — BASIC METABOLIC PANEL
ANION GAP: 5 (ref 5–15)
BUN: 37 mg/dL — AB (ref 6–20)
CALCIUM: 8.3 mg/dL — AB (ref 8.9–10.3)
CO2: 23 mmol/L (ref 22–32)
Chloride: 119 mmol/L — ABNORMAL HIGH (ref 101–111)
Creatinine, Ser: 0.92 mg/dL (ref 0.61–1.24)
GFR calc Af Amer: >60 mL/min (ref >60)
GLUCOSE: 106 mg/dL — AB (ref 65–99)
Potassium: 3.4 mmol/L — ABNORMAL LOW (ref 3.5–5.1)
Sodium: 147 mmol/L — ABNORMAL HIGH (ref 135–145)

## 2017-03-03 LAB — GLUCOSE, CAPILLARY
GLUCOSE-CAPILLARY: 89 mg/dL (ref 65–99)
GLUCOSE-CAPILLARY: 94 mg/dL (ref 65–99)
Glucose-Capillary: 103 mg/dL — ABNORMAL HIGH (ref 65–99)
Glucose-Capillary: 91 mg/dL (ref 65–99)
Glucose-Capillary: 88 mg/dL (ref 65–99)

## 2017-03-03 MED ORDER — DEXTROSE 5 % IV SOLN
2.0000 g | Freq: Three times a day (TID) | INTRAVENOUS | Status: DC
  Administered 2017-03-03 – 2017-03-07 (×10): 2 g via INTRAVENOUS
  Filled 2017-03-03 (×13): qty 2

## 2017-03-03 MED ORDER — POTASSIUM PHOSPHATES 15 MMOLE/5ML IV SOLN
30.0000 mmol | Freq: Once | INTRAVENOUS | Status: AC
  Administered 2017-03-03: 30 mmol via INTRAVENOUS
  Filled 2017-03-03: qty 10

## 2017-03-03 MED ORDER — FREE WATER
200.0000 mL | Status: DC
  Administered 2017-03-03 – 2017-03-07 (×24): 200 mL

## 2017-03-03 MED ORDER — POTASSIUM CHLORIDE 20 MEQ/15ML (10%) PO SOLN
20.0000 meq | ORAL | Status: DC
  Administered 2017-03-03: 20 meq
  Filled 2017-03-03: qty 15

## 2017-03-03 NOTE — Progress Notes (Signed)
PHARMACY NOTE:  ANTIMICROBIAL RENAL DOSAGE ADJUSTMENT  Current antimicrobial regimen includes a mismatch between antimicrobial dosage and estimated renal function.  As per policy approved by the Pharmacy & Therapeutics and Medical Executive Committees, the antimicrobial dosage will be adjusted accordingly.  Current antimicrobial dosage:  Cefepime 2 gm IV Q 24 hours  Indication: HCAP  Renal Function:  Estimated Creatinine Clearance: 91.5 mL/min (by C-G formula based on SCr of 0.92 mg/dL). []      On intermittent HD, scheduled: []      On CRRT     Antimicrobial dosage has been changed to:  Cefepime 2 gm IV Q 8 hours   Additional comments:   Thank you for allowing pharmacy to be a part of this patient's care.  Vinnie LevelBenjamin Shawne Bulow, PharmD., BCPS Clinical Pharmacist Pager [phone]

## 2017-03-03 NOTE — Progress Notes (Signed)
PULMONARY / CRITICAL CARE MEDICINE   Name: Alexander [name] MRN: [id number] DOB: 1955/11/24    ADMISSION DATE:  02/28/2017 CONSULTATION DATE:  8/29  REFERRING MD:  Dr. Wilkie Aye EDP  CHIEF COMPLAINT:  AMS  BRIEF: 61 y/o male with a history of TBI admitted for unresponsiveness and hypotension presumably in the setting of aspiration pneumonia.    SUBJECTIVE:  Off pressors Looks more comfortable   VITAL SIGNS: BP 93/63   Pulse (!) 118   Temp 98.7 F (37.1 C) (Oral)   Resp 15   Ht 6\' 1"  (1.854 m)   Wt 164 lb 14.5 oz (74.8 kg)   SpO2 94%   BMI 21.76 kg/m   HEMODYNAMICS: CVP:  [4 mmHg-14 mmHg] 14 mmHg  VENTILATOR SETTINGS:    INTAKE / OUTPUT: I/O last 3 completed shifts: In: 3905.7 [I.V.:2111.8; Other:90; NG/GT:1703.8] Out: 2425 [Urine:1625; Drains:800]  PHYSICAL EXAMINATION: General appearance: 61 Year old  Male NAD, looks better today  Eyes: anicteric sclerae, moist conjunctivae; PERRL, EOMI bilaterally. Mouth:  membranes are moistNeck: Trachea midline; neck supple, no JVD Lungs/chest: occ rhonchi, with normal respiratory effort and no intercostal retractions CV: RRR, no MRGs  Abdomen: Soft, non-tender; no masses or HSM. PEG unremarkable Extremities: trace LE edema  Skin: Normal temperature, turgor and texture; no rash, ulcers or subcutaneous nodules Psych: more awake. No distress. Follows commands   LABS:  BMET  Recent Labs Lab 03/02/17 0416 03/02/17 2010 03/03/17 0455  NA 143 145 147*  K 2.7* 3.6 3.4*  CL 117* 118* 119*  CO2 21* 22 23  BUN 37* 36* 37*  CREATININE 1.09 0.96 0.92  GLUCOSE 144* 130* 106*    Electrolytes  Recent Labs Lab 02/28/17 0918  02/28/17 2250 03/01/17 0420 03/02/17 0416 03/02/17 2010 03/03/17 0455  CALCIUM  --   < >  --  8.4* 8.2* 8.5* 8.3*  MG 1.6*  --  1.7 1.8  --   --   --   PHOS 2.4*  --  1.2* 1.3*  --   --   --   < > = values in this interval not displayed.  CBC  Recent Labs Lab 02/28/17 0411 03/01/17 0420  03/03/17 0455  WBC 27.2* 21.9* 7.5  HGB 11.9* 12.8* 9.7*  HCT 37.5* 38.9* 30.2*  PLT 73* 78* 53*    Coag's No results for input(s): APTT, INR in the last 168 hours.  Sepsis Markers  Recent Labs Lab 02/28/17 0407 02/28/17 0549 02/28/17 0801  LATICACIDVEN 4.88* 5.5* 3.5*    ABG  Recent Labs Lab 02/28/17 0440 02/28/17 0930 03/01/17 0417  PHART 7.181* 7.277* 7.356  PCO2ART 43.3 33.1 32.4  PO2ART 395.0* 92.3 77.0*    Liver Enzymes  Recent Labs Lab 02/28/17 0411  AST 45*  ALT 44  ALKPHOS 115  BILITOT 0.6  ALBUMIN 2.6*    Cardiac Enzymes No results for input(s): TROPONINI, PROBNP in the last 168 hours.  Glucose  Recent Labs Lab 03/02/17 0724 03/02/17 1126 03/02/17 1713 03/02/17 2021 03/02/17 2339 03/03/17 0330  GLUCAP 119* 117* 108* 103* 99 103*    Imaging No results found.   STUDIES:    CULTURES: Blood 8/29 >Providencia Stuartii Urine 8/29>in sig growth  Sputum 8/29 >  ANTIBIOTICS: Aztreonam 8/29 > 8/29 Cefepim 8/29 >  Vancomycin 8/29 > 8/30  SIGNIFICANT EVENTS: 8/29 admit on vent to ICU. Pressors  LINES/TUBES: ETT 8/29 >8/30 CVL 8/29 >  DISCUSSION: 61 year old disabled male living in SNF. Presented to ED after 2  days unresponsiveness  ASSESSMENT / PLAN:  Sepsis/septic shock in setting of Aspiration pneumonia, UTI & GNR bacteremia (Providencia Stuartii Sensitive to maxipime) Has h/o HTN ->shock resolved ->KVO IVFs Plan Day 4/8 cefepime   Respiratory failure d/t aspiration/HCAP Possible central sleep apnea  ->PCXR review from 8/30: LLL airspace disease. Looked a little worse. Also has RLL atx.  ->nursing reports desaturation to mid 80s when asleep ->looks better as of 9/1 Plan Cont o2 Pulse ox pulm hygiene   AKI > improving Hypokalemia  Mild hypernatremia and hyperchloremia  Hypophosphatemia  Plan Added free water Replace PO4 and recheck in am  F/u am chem  Acute metabolic encephalopathy superimposed on h/o  TBI w subnu clear palsy and prior CVA  w/ associated aphasia Plan Supportive care w/ ADLs Back to SNF at some point  Thrombocytopenia without bleeding P:  [place] heparin Trend cbc  history of seizures  Plan Cont AEDs  Chronic narcotic use Plan duragesic patch and home PRNs   Dysphagia w/ PEG status Plan tubefeeds and PPI  DM Plan ssi   FAMILY  - Updates: none bedside today  - Inter-disciplinary family meet or Palliative Care meeting due by:  9/5  Ready for medsurg  Simonne MartinetPeter E Yailine Ballard ACNP-BC [facility] Pager # [phone] OR # [phone] if no answer  03/03/2017 8:21 AM

## 2017-03-03 NOTE — Progress Notes (Signed)
[facility] FOR AM LAB REPLACEMENT ONLY  The [facility] based on the criteria listed below:   1. Is GFR >/= 40 ml/min? Yes.    Patient's GFR today is >60 2. Is urine output >/= 0.5 ml/kg/hr for the last 6 hours? Yes.  Patient's UOP is 0.6 ml/kg/hr 3. Is BUN < 60 mg/dL? Yes.    Patient's BUN today is 37 4. Abnormal electrolyte(s) K+3.4 5. Ordered repletion with: protocol 6. If a panic level lab has been reported, has the CCM MD in charge been notified? No..   Physician:  Tandy Gawlva  Amory Zbikowski Hilliard 03/03/2017 5:40 AM

## 2017-03-04 DIAGNOSIS — J9621 Acute and chronic respiratory failure with hypoxia: Secondary | ICD-10-CM

## 2017-03-04 DIAGNOSIS — J690 Pneumonitis due to inhalation of food and vomit: Secondary | ICD-10-CM

## 2017-03-04 DIAGNOSIS — R569 Unspecified convulsions: Secondary | ICD-10-CM

## 2017-03-04 DIAGNOSIS — E876 Hypokalemia: Secondary | ICD-10-CM

## 2017-03-04 LAB — GLUCOSE, CAPILLARY
GLUCOSE-CAPILLARY: 106 mg/dL — AB (ref 65–99)
GLUCOSE-CAPILLARY: 117 mg/dL — AB (ref 65–99)
GLUCOSE-CAPILLARY: 83 mg/dL (ref 65–99)
GLUCOSE-CAPILLARY: 83 mg/dL (ref 65–99)
GLUCOSE-CAPILLARY: 89 mg/dL (ref 65–99)
Glucose-Capillary: 90 mg/dL (ref 65–99)

## 2017-03-04 LAB — PHOSPHORUS: Phosphorus: 3.2 mg/dL (ref 2.5–4.6)

## 2017-03-04 MED ORDER — MORPHINE SULFATE (PF) 4 MG/ML IV SOLN
1.0000 mg | INTRAVENOUS | Status: DC | PRN
  Administered 2017-03-04 – 2017-03-06 (×6): 2 mg via INTRAVENOUS
  Filled 2017-03-04 (×6): qty 1

## 2017-03-04 MED ORDER — OXYCODONE HCL 5 MG PO TABS
5.0000 mg | ORAL_TABLET | Freq: Four times a day (QID) | ORAL | Status: DC | PRN
  Administered 2017-03-05 – 2017-03-07 (×5): 5 mg
  Filled 2017-03-04 (×5): qty 1

## 2017-03-04 MED ORDER — POTASSIUM CHLORIDE 20 MEQ PO PACK
20.0000 meq | PACK | Freq: Two times a day (BID) | ORAL | Status: DC
  Filled 2017-03-04: qty 1

## 2017-03-04 MED ORDER — GLUCERNA 1.2 CAL PO LIQD
1000.0000 mL | ORAL | Status: DC
  Filled 2017-03-04 (×5): qty 1000

## 2017-03-04 MED ORDER — POTASSIUM CHLORIDE 20 MEQ PO PACK
20.0000 meq | PACK | Freq: Two times a day (BID) | ORAL | Status: AC
  Administered 2017-03-05 (×2): 20 meq
  Filled 2017-03-04 (×2): qty 1

## 2017-03-04 NOTE — Progress Notes (Signed)
PROGRESS NOTE    Alexander [name]  ZOX:[id number] DOB: [birth year] DOA: 02/28/2017 PCP: Justin Mend, MD   Brief Narrative: 61 y/o male with a history of TBI admitted for unresponsiveness and hypotension presumably in the setting of aspiration pneumonia.  he was found to have providencia bacteremia.  He was admitted to [facility] service and intubated on 8/29 and extubated the next day, also started on levophed.  he was weaned off levophed and transferred to [facility] service on 9/2.  Assessment & Plan:   Active Problems:   Seizure (HCC)   HCAP (healthcare-associated pneumonia)   TBI (traumatic brain injury) (HCC)   Septic shock (HCC)   Acute respiratory failure with hypoxemia (HCC)   Pressure injury of skin   Acute on chronic respiratory failure with hypoxia (HCC)   Septic shock secondary to aspiration pneumonia and PROVIDENCIA bacteremia:  He was initially intubated and extubated on 8/30, started on levophed, as his bp improved, he was weaned off levophed.  Started on IV cefepime and transferred to [facility] service on 9/2. Repeat blood cultures.  Resume IV cefepime.    TBI:  Stable.    Acute respiratory failure with hypoxemia:  Resolved.    Pressure ulcer over the sacrum: wound care consulted and recommendations given.   Acute kidney injury:  Secondary to dehydration, decreased po intake.  Added free water, and hydrated.  Renal parameters improved.    Acute metabolic encephalopathy :  He is currently back to baseline.    H/o seizures:  No seizure activity.  Resume home meds.   Hypokalemia: replenish as needed.   Nutrition:  Resume tube feeds.    Anemia of acute illness:  Baseline around 12, currently around 9.  Monitor    Thrombocytopenia:  Unclear etiology.  ? Infection  Monitor.    Chronic narcotic use:  Resume duragesic patch.   Hypernatremia:  Added free water, repeat sodium in am.     DVT prophylaxis: heparin.  Code Status: full code.    Family Communication: none at bedside.  Disposition Plan: pending resolution of sepsis. He does not want to go to the same SNF,    Consultants:   None.    Procedures: none.   Antimicrobials: cefepime.   Subjective: No new complaints.   Objective: Vitals:   03/03/17 1701 03/03/17 1900 03/03/17 2300 03/04/17 0300  BP: 120/75 108/63  116/65  Pulse: 100 92 96 89  Resp:  (!) 26 (!) 26 (!) 26  Temp: 99.7 F (37.6 C) 100.3 F (37.9 C) 100 F (37.8 C) 100 F (37.8 C)  TempSrc: Oral Oral Oral Oral  SpO2: 95% 95% 96% 95%  Weight:      Height:        Intake/Output Summary (Last 24 hours) at 03/04/17 1544 Last data filed at 03/04/17 1330  Gross per 24 hour  Intake              480 ml  Output             2025 ml  Net            -1545 ml   Filed Weights   03/01/17 0500 03/02/17 0426 03/03/17 0500  Weight: 71 kg (156 lb 8.4 oz) 74.5 kg (164 lb 3.9 oz) 74.8 kg (164 lb 14.5 oz)    Examination:  General exam: Appears calm and comfortable off oxygen.  Respiratory system: scattered rhonchi, no wheezing  Air entry fair.  Cardiovascular system: S1 & S2 heard, RRR.  No JVD, murmurs, rubs, gallops or clicks. No pedal edema. Gastrointestinal system: Abdomen is nondistended, soft and nontender. No organomegaly or masses felt. Normal bowel sounds heard. Central nervous system: Alert and no new deficits.  Extremities: Symmetric 5 x 5 power. Skin: stage 2 sacral pressure ulcer.  Psychiatry: cannot be assessed.     Data Reviewed: I have personally reviewed following labs and imaging studies  CBC:  Recent Labs Lab 02/28/17 0407 02/28/17 0411 03/01/17 0420 03/03/17 0455  WBC  --  27.2* 21.9* 7.5  NEUTROABS  --  24.8* 20.3* 6.0  HGB 12.2* 11.9* 12.8* 9.7*  HCT 36.0* 37.5* 38.9* 30.2*  MCV  --  102.2* 99.0 96.5  PLT  --  73* 78* 53*   Basic Metabolic Panel:  Recent Labs Lab 02/28/17 0918 02/28/17 1200 02/28/17 2250 03/01/17 0420 03/02/17 0416 03/02/17 2010  03/03/17 0455 03/04/17 0419  NA  --  137  --  138 143 145 147*  --   K  --  3.5  --  3.3* 2.7* 3.6 3.4*  --   CL  --  112*  --  111 117* 118* 119*  --   CO2  --  18*  --  19* 21* 22 23  --   GLUCOSE  --  152*  --  110* 144* 130* 106*  --   BUN  --  38*  --  39* 37* 36* 37*  --   CREATININE  --  1.27*  --  1.25* 1.09 0.96 0.92  --   CALCIUM  --  7.7*  --  8.4* 8.2* 8.5* 8.3*  --   MG 1.6*  --  1.7 1.8  --   --   --   --   PHOS 2.4*  --  1.2* 1.3*  --   --   --  3.2   GFR: Estimated Creatinine Clearance: 91.5 mL/min (by C-G formula based on SCr of 0.92 mg/dL). Liver Function Tests:  Recent Labs Lab 02/28/17 0411  AST 45*  ALT 44  ALKPHOS 115  BILITOT 0.6  PROT 4.9*  ALBUMIN 2.6*   No results for input(s): LIPASE, AMYLASE in the last 168 hours.  Recent Labs Lab 02/28/17 0820  AMMONIA 113*   Coagulation Profile: No results for input(s): INR, PROTIME in the last 168 hours. Cardiac Enzymes:  Recent Labs Lab 02/28/17 0411  CKTOTAL 36*   BNP (last 3 results) No results for input(s): PROBNP in the last 8760 hours. HbA1C: No results for input(s): HGBA1C in the last 72 hours. CBG:  Recent Labs Lab 03/03/17 2012 03/04/17 0014 03/04/17 0445 03/04/17 0818 03/04/17 1221  GLUCAP 94 117* 83 106* 89   Lipid Profile: No results for input(s): CHOL, HDL, LDLCALC, TRIG, CHOLHDL, LDLDIRECT in the last 72 hours. Thyroid Function Tests: No results for input(s): TSH, T4TOTAL, FREET4, T3FREE, THYROIDAB in the last 72 hours. Anemia Panel: No results for input(s): VITAMINB12, FOLATE, FERRITIN, TIBC, IRON, RETICCTPCT in the last 72 hours. Sepsis Labs:  Recent Labs Lab 02/28/17 0407 02/28/17 0549 02/28/17 0801  LATICACIDVEN 4.88* 5.5* 3.5*    Recent Results (from the past 240 hour(s))  Blood Culture (routine x 2)     Status: Abnormal   Collection Time: 02/28/17  4:05 AM  Result Value Ref Range Status   Specimen Description BLOOD LEFT HAND  Final   Special Requests IN  PEDIATRIC BOTTLE Blood Culture adequate volume  Final   Culture  Setup Time   Final  GRAM NEGATIVE RODS IN PEDIATRIC BOTTLE CRITICAL RESULT CALLED TO, READ BACK BY AND VERIFIED WITH: R. CLARK,PHARMD 2007 02/28/2017 T. TYSOR    Culture PROVIDENCIA STUARTII (A)  Final   Report Status 03/02/2017 FINAL  Final   Organism ID, Bacteria PROVIDENCIA STUARTII  Final      Susceptibility   Providencia stuartii - MIC*    AMPICILLIN >=32 RESISTANT Resistant     CEFAZOLIN >=64 RESISTANT Resistant     CEFEPIME <=1 SENSITIVE Sensitive     CEFTAZIDIME <=1 SENSITIVE Sensitive     CEFTRIAXONE <=1 SENSITIVE Sensitive     CIPROFLOXACIN >=4 RESISTANT Resistant     GENTAMICIN RESISTANT Resistant     IMIPENEM 1 SENSITIVE Sensitive     TRIMETH/SULFA >=320 RESISTANT Resistant     AMPICILLIN/SULBACTAM >=32 RESISTANT Resistant     PIP/TAZO <=4 SENSITIVE Sensitive     * PROVIDENCIA STUARTII  Blood Culture ID Panel (Reflexed)     Status: None   Collection Time: 02/28/17  4:05 AM  Result Value Ref Range Status   Enterococcus species NOT DETECTED NOT DETECTED Final   Listeria monocytogenes NOT DETECTED NOT DETECTED Final   Staphylococcus species NOT DETECTED NOT DETECTED Final   Staphylococcus aureus NOT DETECTED NOT DETECTED Final   Streptococcus species NOT DETECTED NOT DETECTED Final   Streptococcus agalactiae NOT DETECTED NOT DETECTED Final   Streptococcus pneumoniae NOT DETECTED NOT DETECTED Final   Streptococcus pyogenes NOT DETECTED NOT DETECTED Final   Acinetobacter baumannii NOT DETECTED NOT DETECTED Final   Enterobacteriaceae species NOT DETECTED NOT DETECTED Final   Enterobacter cloacae complex NOT DETECTED NOT DETECTED Final   Escherichia coli NOT DETECTED NOT DETECTED Final   Klebsiella oxytoca NOT DETECTED NOT DETECTED Final   Klebsiella pneumoniae NOT DETECTED NOT DETECTED Final   Proteus species NOT DETECTED NOT DETECTED Final   Serratia marcescens NOT DETECTED NOT DETECTED Final    Haemophilus influenzae NOT DETECTED NOT DETECTED Final   Neisseria meningitidis NOT DETECTED NOT DETECTED Final   Pseudomonas aeruginosa NOT DETECTED NOT DETECTED Final   Candida albicans NOT DETECTED NOT DETECTED Final   Candida glabrata NOT DETECTED NOT DETECTED Final   Candida krusei NOT DETECTED NOT DETECTED Final   Candida parapsilosis NOT DETECTED NOT DETECTED Final   Candida tropicalis NOT DETECTED NOT DETECTED Final  Blood Culture (routine x 2)     Status: None (Preliminary result)   Collection Time: 02/28/17  5:08 AM  Result Value Ref Range Status   Specimen Description BLOOD RIGHT HAND  Final   Special Requests IN PEDIATRIC BOTTLE Blood Culture adequate volume  Final   Culture NO GROWTH 4 DAYS  Final   Report Status PENDING  Incomplete  MRSA PCR Screening     Status: None   Collection Time: 02/28/17  6:37 AM  Result Value Ref Range Status   MRSA by PCR NEGATIVE NEGATIVE Final    Comment:        The GeneXpert MRSA Assay (FDA approved for NASAL specimens only), is one component of a comprehensive MRSA colonization surveillance program. It is not intended to diagnose MRSA infection nor to guide or monitor treatment for MRSA infections.   Urine Culture     Status: Abnormal   Collection Time: 03/01/17  8:57 AM  Result Value Ref Range Status   Specimen Description URINE, RANDOM  Final   Special Requests NONE  Final   Culture <10,000 COLONIES/mL INSIGNIFICANT GROWTH (A)  Final   Report Status 03/02/2017 FINAL  Final  Radiology Studies: Dg Chest Port 1 View  Result Date: 03/03/2017 CLINICAL DATA:  Pneumonia. EXAM: PORTABLE CHEST 1 VIEW COMPARISON:  One-view chest x-ray 03/01/2017 FINDINGS: The heart size is exaggerated by low lung volumes. Moderate pulmonary vascular congestion and edema has increased. Bibasilar airspace disease continues to increase, left greater than right. Small effusions are suspected. A left IJ line is stable.  The patient has been  extubated. IMPRESSION: 1. Increasing pulmonary vascular congestion and edema. 2. Increasing bibasilar airspace disease, left greater than right. This is worrisome for bilateral lobar pneumonia. 3. Small bilateral pleural effusions are present. Electronically Signed   By: Marin Roberts M.D.   On: 03/03/2017 08:43        Scheduled Meds: . chlorhexidine  15 mL Mouth Rinse BID  . fentaNYL  50 mcg Transdermal Q72H  . free water  200 mL Per Tube Q4H  . heparin  5,000 Units Subcutaneous Q8H  . lacosamide  100 mg Per Tube BID  . levETIRAcetam  250 mg Per Tube BID  . mouth rinse  15 mL Mouth Rinse q12n4p  . OXcarbazepine  600 mg Per Tube QHS  . pantoprazole sodium  40 mg Per Tube Q1200  . topiramate  100 mg Per Tube BID   Continuous Infusions: . ceFEPime (MAXIPIME) IV 2 g (03/04/17 1455)  . feeding supplement (GLUCERNA 1.2 CAL) 1,000 mL (03/03/17 1700)  . lactated ringers 10 mL/hr at 03/03/17 1200     LOS: 4 days    Time spent: 35 minutes.    Kathlen Mody, MD [facility] Pager [phone]  If 7PM-7AM, please contact night-coverage [email or web address] Password [facility] 03/04/2017, 3:44 PM

## 2017-03-04 NOTE — Plan of Care (Signed)
Problem: Pain Managment: Goal: General experience of comfort will improve Patient voices understanding of pain scale and asks for medication when needed

## 2017-03-04 NOTE — Progress Notes (Signed)
Pt is able to communicate by writing short words, pointing to letters in alphabet to spell words, and nodding yes or no. Through these methods pt was able to communicate that [facility], but would like to go to a new facility in [place] if possible. Will pass along to MD, CSW, and night RN.

## 2017-03-04 NOTE — Consult Note (Addendum)
WOC Nurse wound consult note Reason for Consult: New area to left lower buttocks reported as Stage 2 PrI, but is actually a DTPI. Wound type:Pressure Pressure Injury POA: No Measurement:2.4cm x 4.5cm purple/maroon discoloration with small break in integument at this time noted at the most lateral edge measuring 0.5cm round x 0.1cm. 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 Dressing procedure/placement/frequency: I will provide patient with a mattress replacement with low air loss feature and bilateral heel pressure redistribution boots as he continues at high risk for skin injury, including those related to microclimate (moisture), pressure and shear force injury (he has tube feedings and pneumonia and HOB is elevated). Guidance is provided to the Nursing Staff via the orders for turning and repositioning and for limiting time spent in the supine position. I recommend covering with silicone dressing, but if this becomes soiled several times each shift, removals may be too traumatic.If this is the case, barrier cream and strict adherence to turning schedule is recommended. WOC nursing team will not follow routinely, but will remain available to this patient, the nursing and medical teams if area does not resolve in a manner consistent with overall progress.  Please re-consult if needed. Thanks, Ladona MowLaurie Shelie Lansing, MSN, RN, GNP, Hans EdenCWOCN, CWON-AP, FAAN  Pager# [phone]

## 2017-03-05 DIAGNOSIS — L89159 Pressure ulcer of sacral region, unspecified stage: Secondary | ICD-10-CM

## 2017-03-05 LAB — CBC
HCT: 32 % — ABNORMAL LOW (ref 39.0–52.0)
Hemoglobin: 10.3 g/dL — ABNORMAL LOW (ref 13.0–17.0)
MCH: 31.7 pg (ref 26.0–34.0)
MCHC: 32.2 g/dL (ref 30.0–36.0)
MCV: 98.5 fL (ref 78.0–100.0)
Platelets: 79 10*3/uL — ABNORMAL LOW (ref 150–400)
RBC: 3.25 MIL/uL — AB (ref 4.22–5.81)
RDW: 13.6 % (ref 11.5–15.5)
WBC: 9.6 10*3/uL (ref 4.0–10.5)

## 2017-03-05 LAB — BASIC METABOLIC PANEL
Anion gap: 7 (ref 5–15)
BUN: 38 mg/dL — ABNORMAL HIGH (ref 6–20)
CALCIUM: 8.1 mg/dL — AB (ref 8.9–10.3)
CHLORIDE: 116 mmol/L — AB (ref 101–111)
CO2: 22 mmol/L (ref 22–32)
CREATININE: 0.78 mg/dL (ref 0.61–1.24)
Glucose, Bld: 108 mg/dL — ABNORMAL HIGH (ref 65–99)
Potassium: 3.5 mmol/L (ref 3.5–5.1)
SODIUM: 145 mmol/L (ref 135–145)

## 2017-03-05 LAB — GLUCOSE, CAPILLARY
GLUCOSE-CAPILLARY: 81 mg/dL (ref 65–99)
GLUCOSE-CAPILLARY: 88 mg/dL (ref 65–99)
GLUCOSE-CAPILLARY: 90 mg/dL (ref 65–99)
GLUCOSE-CAPILLARY: 96 mg/dL (ref 65–99)
Glucose-Capillary: 90 mg/dL (ref 65–99)
Glucose-Capillary: 96 mg/dL (ref 65–99)

## 2017-03-05 LAB — CULTURE, BLOOD (ROUTINE X 2)
Culture: NO GROWTH
Special Requests: ADEQUATE

## 2017-03-05 MED ORDER — WHITE PETROLATUM GEL
Status: AC
  Administered 2017-03-05: 1
  Filled 2017-03-05: qty 1

## 2017-03-05 MED ORDER — WHITE PETROLATUM GEL
Status: DC | PRN
Start: 2017-03-05 — End: 2017-03-07

## 2017-03-05 NOTE — Progress Notes (Signed)
PROGRESS NOTE    Alexander [name]  ZOX:[id number] DOB: 12/27/1955 DOA: 02/28/2017 PCP: Justin Mend, MD   Brief Narrative: 61 y/o male with a history of TBI admitted for unresponsiveness and hypotension presumably in the setting of aspiration pneumonia.  he was found to have providencia bacteremia.  He was admitted to [facility] service and intubated on 8/29 and extubated the next day, also started on levophed.  he was weaned off levophed and transferred to [facility] service on 9/2.  Assessment & Plan:   Active Problems:   Seizure (HCC)   HCAP (healthcare-associated pneumonia)   TBI (traumatic brain injury) (HCC)   Septic shock (HCC)   Acute respiratory failure with hypoxemia (HCC)   Pressure injury of skin   Acute on chronic respiratory failure with hypoxia (HCC)   Septic shock secondary to aspiration pneumonia and PROVIDENCIA bacteremia:  He was initially intubated and extubated on 8/30, started on levophed, as his bp improved, he was weaned off levophed.  Started on IV cefepime and transferred to [facility] service on 9/2. Repeat blood cultures ordered , pending.  Resume IV cefepime.    TBI:  Stable.    Acute respiratory failure with hypoxemia:  Resolved. Aspiration of the secretions.    Pressure ulcer over the sacrum: wound care consulted and recommendations given.   Acute kidney injury:  Secondary to dehydration, decreased po intake.  Added free water, and hydrated.  Renal parameters improved.    Acute metabolic encephalopathy :  He is currently back to baseline.    H/o seizures:  No seizure activity.  Resume home meds.   Hypokalemia: replenish as needed.   Nutrition:  Resume tube feeds.    Anemia of acute illness:  Baseline around 12, currently around 9. Hemoglobin stable around 10.  Monitor    Thrombocytopenia:  Unclear etiology.  ? Infection , improving.  Monitor.    Chronic narcotic use:  Resume duragesic patch.   Hypernatremia:  Added free  water, repeat sodium in am show improvement.     DVT prophylaxis: heparin.  Code Status: full code.  Family Communication: none at bedside.  Disposition Plan: SNF at a new facility in 1 to 2 days.    Consultants:   None.    Procedures: none.   Antimicrobials: cefepime.   Subjective: No new complaints.   Objective: Vitals:   03/04/17 0300 03/04/17 1727 03/04/17 1951 03/05/17 0413  BP: 116/65 117/64 117/68 133/79  Pulse: 89 79 81 89  Resp: (!) 26 (!) 24 (!) 21 20  Temp: 100 F (37.8 C) 98.9 F (37.2 C) 98.2 F (36.8 C) 98.5 F (36.9 C)  TempSrc: Oral Oral Oral Oral  SpO2: 95% 96% 96% 97%  Weight:      Height:        Intake/Output Summary (Last 24 hours) at 03/05/17 1650 Last data filed at 03/05/17 1300  Gross per 24 hour  Intake              750 ml  Output             1400 ml  Net             -650 ml   Filed Weights   03/01/17 0500 03/02/17 0426 03/03/17 0500  Weight: 71 kg (156 lb 8.4 oz) 74.5 kg (164 lb 3.9 oz) 74.8 kg (164 lb 14.5 oz)    Examination:  General exam: Appears calm and comfortable off oxygen.  Respiratory system: air entry fair, no wheezing  or rhonchi.  Cardiovascular system: S1 & S2 heard, RRR. No JVD, murmurs, rubs, gallops or clicks. No pedal edema. Gastrointestinal system: Abdomen is soft NT ND BS+ Central nervous system: Alert and no new deficits.  Extremities: Symmetric 5 x 5 power. Skin: stage 2 sacral pressure ulcer.  Psychiatry: cannot be assessed.     Data Reviewed: I have personally reviewed following labs and imaging studies  CBC:  Recent Labs Lab 02/28/17 0407 02/28/17 0411 03/01/17 0420 03/03/17 0455 03/05/17 0247  WBC  --  27.2* 21.9* 7.5 9.6  NEUTROABS  --  24.8* 20.3* 6.0  --   HGB 12.2* 11.9* 12.8* 9.7* 10.3*  HCT 36.0* 37.5* 38.9* 30.2* 32.0*  MCV  --  102.2* 99.0 96.5 98.5  PLT  --  73* 78* 53* 79*   Basic Metabolic Panel:  Recent Labs Lab 02/28/17 0918  02/28/17 2250 03/01/17 0420 03/02/17 0416  03/02/17 2010 03/03/17 0455 03/04/17 0419 03/05/17 0247  NA  --   < >  --  138 143 145 147*  --  145  K  --   < >  --  3.3* 2.7* 3.6 3.4*  --  3.5  CL  --   < >  --  111 117* 118* 119*  --  116*  CO2  --   < >  --  19* 21* 22 23  --  22  GLUCOSE  --   < >  --  110* 144* 130* 106*  --  108*  BUN  --   < >  --  39* 37* 36* 37*  --  38*  CREATININE  --   < >  --  1.25* 1.09 0.96 0.92  --  0.78  CALCIUM  --   < >  --  8.4* 8.2* 8.5* 8.3*  --  8.1*  MG 1.6*  --  1.7 1.8  --   --   --   --   --   PHOS 2.4*  --  1.2* 1.3*  --   --   --  3.2  --   < > = values in this interval not displayed. GFR: Estimated Creatinine Clearance: 105.2 mL/min (by C-G formula based on SCr of 0.78 mg/dL). Liver Function Tests:  Recent Labs Lab 02/28/17 0411  AST 45*  ALT 44  ALKPHOS 115  BILITOT 0.6  PROT 4.9*  ALBUMIN 2.6*   No results for input(s): LIPASE, AMYLASE in the last 168 hours.  Recent Labs Lab 02/28/17 0820  AMMONIA 113*   Coagulation Profile: No results for input(s): INR, PROTIME in the last 168 hours. Cardiac Enzymes:  Recent Labs Lab 02/28/17 0411  CKTOTAL 36*   BNP (last 3 results) No results for input(s): PROBNP in the last 8760 hours. HbA1C: No results for input(s): HGBA1C in the last 72 hours. CBG:  Recent Labs Lab 03/04/17 2359 03/05/17 0449 03/05/17 0821 03/05/17 1156 03/05/17 1633  GLUCAP 81 90 88 96 90   Lipid Profile: No results for input(s): CHOL, HDL, LDLCALC, TRIG, CHOLHDL, LDLDIRECT in the last 72 hours. Thyroid Function Tests: No results for input(s): TSH, T4TOTAL, FREET4, T3FREE, THYROIDAB in the last 72 hours. Anemia Panel: No results for input(s): VITAMINB12, FOLATE, FERRITIN, TIBC, IRON, RETICCTPCT in the last 72 hours. Sepsis Labs:  Recent Labs Lab 02/28/17 0407 02/28/17 0549 02/28/17 0801  LATICACIDVEN 4.88* 5.5* 3.5*    Recent Results (from the past 240 hour(s))  Blood Culture (routine x 2)     Status:  Abnormal   Collection Time:  02/28/17  4:05 AM  Result Value Ref Range Status   Specimen Description BLOOD LEFT HAND  Final   Special Requests IN PEDIATRIC BOTTLE Blood Culture adequate volume  Final   Culture  Setup Time   Final    GRAM NEGATIVE RODS IN PEDIATRIC BOTTLE CRITICAL RESULT CALLED TO, READ BACK BY AND VERIFIED WITH: R. CLARK,PHARMD 2007 02/28/2017 T. TYSOR    Culture PROVIDENCIA STUARTII (A)  Final   Report Status 03/02/2017 FINAL  Final   Organism ID, Bacteria PROVIDENCIA STUARTII  Final      Susceptibility   Providencia stuartii - MIC*    AMPICILLIN >=32 RESISTANT Resistant     CEFAZOLIN >=64 RESISTANT Resistant     CEFEPIME <=1 SENSITIVE Sensitive     CEFTAZIDIME <=1 SENSITIVE Sensitive     CEFTRIAXONE <=1 SENSITIVE Sensitive     CIPROFLOXACIN >=4 RESISTANT Resistant     GENTAMICIN RESISTANT Resistant     IMIPENEM 1 SENSITIVE Sensitive     TRIMETH/SULFA >=320 RESISTANT Resistant     AMPICILLIN/SULBACTAM >=32 RESISTANT Resistant     PIP/TAZO <=4 SENSITIVE Sensitive     * PROVIDENCIA STUARTII  Blood Culture ID Panel (Reflexed)     Status: None   Collection Time: 02/28/17  4:05 AM  Result Value Ref Range Status   Enterococcus species NOT DETECTED NOT DETECTED Final   Listeria monocytogenes NOT DETECTED NOT DETECTED Final   Staphylococcus species NOT DETECTED NOT DETECTED Final   Staphylococcus aureus NOT DETECTED NOT DETECTED Final   Streptococcus species NOT DETECTED NOT DETECTED Final   Streptococcus agalactiae NOT DETECTED NOT DETECTED Final   Streptococcus pneumoniae NOT DETECTED NOT DETECTED Final   Streptococcus pyogenes NOT DETECTED NOT DETECTED Final   Acinetobacter baumannii NOT DETECTED NOT DETECTED Final   Enterobacteriaceae species NOT DETECTED NOT DETECTED Final   Enterobacter cloacae complex NOT DETECTED NOT DETECTED Final   Escherichia coli NOT DETECTED NOT DETECTED Final   Klebsiella oxytoca NOT DETECTED NOT DETECTED Final   Klebsiella pneumoniae NOT DETECTED NOT DETECTED  Final   Proteus species NOT DETECTED NOT DETECTED Final   Serratia marcescens NOT DETECTED NOT DETECTED Final   Haemophilus influenzae NOT DETECTED NOT DETECTED Final   Neisseria meningitidis NOT DETECTED NOT DETECTED Final   Pseudomonas aeruginosa NOT DETECTED NOT DETECTED Final   Candida albicans NOT DETECTED NOT DETECTED Final   Candida glabrata NOT DETECTED NOT DETECTED Final   Candida krusei NOT DETECTED NOT DETECTED Final   Candida parapsilosis NOT DETECTED NOT DETECTED Final   Candida tropicalis NOT DETECTED NOT DETECTED Final  Blood Culture (routine x 2)     Status: None   Collection Time: 02/28/17  5:08 AM  Result Value Ref Range Status   Specimen Description BLOOD RIGHT HAND  Final   Special Requests IN PEDIATRIC BOTTLE Blood Culture adequate volume  Final   Culture NO GROWTH 5 DAYS  Final   Report Status 03/05/2017 FINAL  Final  MRSA PCR Screening     Status: None   Collection Time: 02/28/17  6:37 AM  Result Value Ref Range Status   MRSA by PCR NEGATIVE NEGATIVE Final    Comment:        The GeneXpert MRSA Assay (FDA approved for NASAL specimens only), is one component of a comprehensive MRSA colonization surveillance program. It is not intended to diagnose MRSA infection nor to guide or monitor treatment for MRSA infections.   Urine Culture  Status: Abnormal   Collection Time: 03/01/17  8:57 AM  Result Value Ref Range Status   Specimen Description URINE, RANDOM  Final   Special Requests NONE  Final   Culture <10,000 COLONIES/mL INSIGNIFICANT GROWTH (A)  Final   Report Status 03/02/2017 FINAL  Final         Radiology Studies: No results found.      Scheduled Meds: . chlorhexidine  15 mL Mouth Rinse BID  . fentaNYL  50 mcg Transdermal Q72H  . free water  200 mL Per Tube Q4H  . heparin  5,000 Units Subcutaneous Q8H  . lacosamide  100 mg Per Tube BID  . levETIRAcetam  250 mg Per Tube BID  . mouth rinse  15 mL Mouth Rinse q12n4p  . OXcarbazepine   600 mg Per Tube QHS  . pantoprazole sodium  40 mg Per Tube Q1200  . topiramate  100 mg Per Tube BID   Continuous Infusions: . ceFEPime (MAXIPIME) IV Stopped (03/05/17 0108)  . feeding supplement (GLUCERNA 1.2 CAL) 1,000 mL (03/05/17 0600)     LOS: 5 days    Time spent: 35 minutes.    Kathlen ModyAKULA,Emon Lance, MD [facility] Pager [phone]  If 7PM-7AM, please contact night-coverage [email or web address] Password [email or web address] 03/05/2017, 4:50 PM

## 2017-03-05 NOTE — Progress Notes (Signed)
PT Cancellation Note  Patient Details Name: Alexander FreudMartin A Bruney MRN: [id number] DOB: 27-May-1956   Cancelled Treatment:    Reason Eval/Treat Not Completed: PT screened, no needs identified, will sign off. PT screened on 03/02/17 with no needs identified (see details from PT note 8/31). Will sign-off and remove from caseload. Recommend mechanical lift for OOB. Refer therapy back to SNF.   Ina HomesJaclyn Branon Sabine, PT, DPT Acute Rehab Services  Pager: [phone]  Malachy ChamberJaclyn L Tawnya Pujol 03/05/2017, 8:46 AM

## 2017-03-05 NOTE — Progress Notes (Signed)
OT Screen -  03/05/17    03/05/17 1000  OT Visit Information  Last OT Received On 03/05/17  Reason Eval/Treat Not Completed OT screened, no needs identified, will sign off. Pt receiving Total A for ADLs and using mechanical lift for OOB, PTA. Will sign-off and refer further therapy back to SNF. Thank you.   Neosha Switalski MSOT, OTR/L Acute Rehab Pager: [phone] Office: [phone]

## 2017-03-05 NOTE — Plan of Care (Signed)
Problem: Pain Managment: Goal: General experience of comfort will improve Outcome: Progressing Patient is able to express the need for pain medication

## 2017-03-06 LAB — GLUCOSE, CAPILLARY
GLUCOSE-CAPILLARY: 78 mg/dL (ref 65–99)
GLUCOSE-CAPILLARY: 98 mg/dL (ref 65–99)
GLUCOSE-CAPILLARY: 101 mg/dL — AB (ref 65–99)
Glucose-Capillary: 91 mg/dL (ref 65–99)
Glucose-Capillary: 94 mg/dL (ref 65–99)
Glucose-Capillary: 96 mg/dL (ref 65–99)

## 2017-03-06 LAB — PLATELET COUNT: Platelets: 104 10*3/uL — ABNORMAL LOW (ref 150–400)

## 2017-03-06 NOTE — NC FL2 (Signed)
[place] MEDICAID FL2 LEVEL OF CARE SCREENING TOOL     IDENTIFICATION  Patient Name: Alexander FreudMartin A Chizmar Birthdate: 1955/07/12 Sex: male Admission Date (Current Location): 02/28/2017  [facility] [place]   Facility and Address:  [facility], [address], [place], [place] [address]      Provider Number: [id number]  Attending Physician Name and Address:  Kathlen ModyAkula, Vijaya, MD  Relative Name and Phone Number:       Current Level of Care: Hospital Recommended Level of Care: Skilled Nursing Facility Prior Approval Number:    Date Approved/Denied: 03/06/17 PASRR Number: [id number]  Discharge Plan: SNF    Current Diagnoses: Patient Active Problem List   Diagnosis Date Noted  . Acute on chronic respiratory failure with hypoxia (HCC)   . Pressure injury of skin 03/01/2017  . Septic shock (HCC) 02/28/2017  . Acute respiratory failure with hypoxemia (HCC) 02/28/2017  . Altered mental status   . Dermatitis 11/10/2015  . Chronic bronchitis (HCC) 10/04/2015  . Constipation 09/22/2015  . Pain in testicle 08/09/2015  . Insomnia 08/02/2015  . Urinary retention 07/26/2015  . BPH (benign prostatic hyperplasia) 07/26/2015  . Dysuria 07/19/2015  . Expressive aphasia 07/19/2015  . Decubitus ulcer of sacral area 11/19/2014  . Seborrhea capitis 11/19/2014  . 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 Hypercholesterolemia without hypertriglyceridemia 11/04/2014  . 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 TBI (traumatic brain injury) (HCC) 05/24/2014  . Diabetes mellitus, type 2 (HCC) 10/10/2013  . CN (constipation) 06/12/2013  . HCAP (healthcare-associated pneumonia) 06/11/2013  . Cervical myelopathy (HCC) 10/09/2012  . C1 cervical fracture (HCC) 10/09/2012  . 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 Seizure (HCC)     Orientation RESPIRATION BLADDER Height & Weight      Self, Time, Situation  Normal Continent Weight: 167 lb 2 oz (75.8 kg) Height:  6\' 1"  (185.4 cm)  BEHAVIORAL SYMPTOMS/MOOD NEUROLOGICAL BOWEL NUTRITION STATUS      Incontinent Diet, Feeding tube (See DC Summary)  AMBULATORY STATUS COMMUNICATION OF NEEDS Skin   Total Care Verbally Normal                       Personal Care Assistance Level of Assistance  Total care       Total Care Assistance: Maximum assistance   Functional Limitations Info  Speech     Speech Info: Impaired    SPECIAL CARE FACTORS FREQUENCY                       Contractures      Additional Factors Info  Code Status, Allergies Code Status Info: Full Code Allergies Info: AMOXICILLIN            Current Medications (03/06/2017):  This is the current hospital active medication list Current Facility-Administered Medications  Medication Dose Route Frequency Provider Last Rate Last Dose  . acetaminophen (TYLENOL) solution 650 mg  650 mg Per Tube Q4H PRN Lupita LeashMcQuaid, Douglas B, MD   650 mg at 03/05/17 0036  . ceFEPIme (MAXIPIME) 2 g in dextrose 5 % 50 mL IVPB  2 g Intravenous Q8H Sampson SiMancheril, Benjamin G, RPH   Stopped at 03/06/17 1324  . chlorhexidine (PERIDEX) 0.12 % solution 15 mL  15 mL Mouth Rinse BID Max FickleMcQuaid, Douglas B, MD   15 mL at 03/06/17 0857  . docusate (COLACE) 50 MG/5ML liquid 100 mg  100 mg Per Tube  BID PRN Reyes Ivan, MD      . feeding supplement (GLUCERNA 1.2 CAL) liquid 1,000 mL  1,000 mL Per Tube Continuous Kathlen Mody, MD 50 mL/hr at 03/05/17 0600 1,000 mL at 03/05/17 0600  . fentaNYL (DURAGESIC - dosed mcg/hr) 50 mcg  50 mcg Transdermal Q72H Simonne Martinet, NP   50 mcg at 03/05/17 1200  . free water 200 mL  200 mL Per Tube Q4H Simonne Martinet, NP   200 mL at 03/06/17 1542  . heparin injection 5,000 Units  5,000 Units Subcutaneous Q8H Reyes Ivan, MD   5,000 Units at 03/06/17 1255  . lacosamide (VIMPAT) tablet 100 mg  100 mg Per Tube BID Reyes Ivan, MD   100 mg at  03/06/17 0857  . levETIRAcetam (KEPPRA) 100 MG/ML solution 250 mg  250 mg Per Tube BID Reyes Ivan, MD   250 mg at 03/06/17 0857  . MEDLINE mouth rinse  15 mL Mouth Rinse q12n4p Max Fickle B, MD   15 mL at 03/06/17 1542  . morphine 4 MG/ML injection 1-2 mg  1-2 mg Intravenous Q4H PRN Kathlen Mody, MD   2 mg at 03/06/17 1255  . ondansetron (ZOFRAN) injection 4 mg  4 mg Intravenous Q6H PRN Aljishi, Vashti Hey, MD      . OXcarbazepine (TRILEPTAL) 300 MG/5ML suspension 600 mg  600 mg Per Tube QHS Max Fickle B, MD   600 mg at 03/05/17 2329  . oxyCODONE (Oxy IR/ROXICODONE) immediate release tablet 5 mg  5 mg Per Tube Q6H PRN Kathlen Mody, MD   5 mg at 03/06/17 0857  . pantoprazole sodium (PROTONIX) 40 mg/20 mL oral suspension 40 mg  40 mg Per Tube Q1200 Simonne Martinet, NP   40 mg at 03/06/17 1210  . topiramate (TOPAMAX) tablet 100 mg  100 mg Per Tube BID Reyes Ivan, MD   100 mg at 03/06/17 0858  . white petrolatum (VASELINE) gel   Topical PRN Kathlen Mody, MD         Discharge Medications: Please see discharge summary for a list of discharge medications.  Relevant Imaging Results:  Relevant Lab Results:   Additional Information SS#: [id number]  Tresa Moore, LCSW

## 2017-03-06 NOTE — Clinical Social Work Note (Signed)
Clinical Social Work Assessment  Patient Details  Name: Alexander [name] MRN: [id number] Date of Birth: June 14, 1956  Date of referral:  03/06/17               Reason for consult:  Facility Placement                Permission sought to share information with:  Facility Art therapist granted to share information::  Yes, Verbal Permission Granted  Name::     brother, mother at bedside  Agency::  SNF  Relationship::     Contact Information:     Housing/Transportation Living arrangements for the past 2 months:  [place] of Information:  Patient, Parent, Other (Comment Required) (Brother) Patient Interpreter Needed:  None Criminal Activity/Legal Involvement Pertinent to Current Situation/Hospitalization:  No - Comment as needed Significant Relationships:  Other Family Members, Siblings, Parents Lives with:  Facility Resident Do you feel safe going back to the place where you live?  No Need for family participation in patient care:  Yes (Comment)  Care giving concerns:  Pt from [facility] and would not like to return. CSW will assist with new placement for short term rehab.  Social Worker assessment / plan:  CSW met with patient, brother, mother at bedside to discuss placement. Family would like patient to be closer to them and are interested in [place] or surrounding area. CSW will assist with placement.  Employment status:  Disabled (Comment on whether or not currently receiving Disability) Insurance information:  Programmer, applications PT Recommendations:  [place] / Referral to community resources:  [place]  Patient/Family's Response to care:  Psychologist, prison and probation services of CSW assistance. No issues or concerns identified.  Patient/Family's Understanding of and Emotional Response to Diagnosis, Current Treatment, and Prognosis:  Patient/Family has good understanding of diagnosis, current treatment and  prognosis. Pt hopeful he will like the new SNF and get the care that he needs. No issues or concerns identified.  Emotional Assessment Appearance:  Appears older than stated age Attitude/Demeanor/Rapport:   (Cooperative) Affect (typically observed):  Accepting, Appropriate Orientation:  Oriented to Self, Oriented to Place, Oriented to  Time, Oriented to Situation Alcohol / Substance use:  Not Applicable Psych involvement (Current and /or in the community):  No (Comment)  Discharge Needs  Concerns to be addressed:  Care Coordination Readmission within the last 30 days:  No Current discharge risk:  Physical Impairment, Dependent with Mobility Barriers to Discharge:  No Barriers Identified   Normajean Baxter, LCSW 03/06/2017, 11:43 AM

## 2017-03-06 NOTE — Progress Notes (Signed)
PROGRESS NOTE    Alexander [name] A Guterrez  ZOX:[id number] DOB: [birth year] DOA: 02/28/2017 PCP: [name], Gerardo E, MD   Brief Narrative: 61 y/o male with a history of TBI admitted for unresponsiveness and hypotension presumably in the setting of aspiration pneumonia.  he was found to have providencia bacteremia.  [facility] and int[facility]ated on 8/29 and extubated the next day, also started on levophed.  he was weaned off levophed and [facility]  Assessment & Plan:   Active Problems:   Seizure (HCC)   HCAP (healthcare-associated pneumonia)   TBI (traumatic brain injury) (HCC)   Septic shock (HCC)   Acute respiratory failure with hypoxemia (HCC)   Pressure injury of skin   Acute on chronic respiratory failure with hypoxia (HCC)   Septic shock secondary to aspiration pneumonia and PROVIDENCIA bacteremia:  He was initially intubated and extubated on 8/30, started on levophed, as his bp improved, he was weaned off levophed.  Started on IV cefepime and [facility] service on 9/2. Repeat blood cultures ordered , pending and negative so far. Resume IV cefepime. Day 7 of antibiotics. Transition to oral antibiotics starting tomorrow, if blood cultures remain negative.    TBI:  Stable.    Acute respiratory failure with hypoxemia:  Resolved. Pulmonary toilet.  Decreased the rate of the tube feeds from 70 ml to 40 ml/hr and his congestion and secretions improved.    Pressure ulcer over the sacrum: wound care consulted and recommendations given.   Acute kidney injury:  Secondary to dehydration, decreased po intake.  Added free water, and hydrated.  Renal parameters improved.    Acute metabolic encephalopathy :  He is currently back to baseline.    H/o seizures:  No seizure activity.  Resume home meds.   Hypokalemia: replenish as needed.   Nutrition:  Resume tube feeds. Decreased the rate of tube feeds to 40 ml/hr, can slowly go  up on his recommended rate.    Anemia of acute illness:  Baseline around 12, currently around 9. Hemoglobin stable around 10.  Monitor    Thrombocytopenia:  Unclear etiology.  ? Infection , improving.  Monitor.    Chronic narcotic use:  Resume duragesic patch.   Hypernatremia:  Added free water, repeat sodium in am show improvement.   Leukocytosis resolved.   DVT prophylaxis: heparin.  Code Status: full code.  Family Communication: none at bedside.  Disposition Plan: SNF at a new facility in 1 to 2 days.    Consultants:   None.    Procedures: none.   Antimicrobials: cefepime.   Subjective: No new complaints. Appears comfortable.   Objective: Vitals:   03/05/17 2153 03/06/17 0431 03/06/17 0500 03/06/17 1536  BP: 123/65 118/81  134/88  Pulse: 72 74  74  Resp: 20 18  20   Temp: 98.7 F (37.1 C) 98.5 F (36.9 C)  99 F (37.2 C)  TempSrc: Oral Oral  Oral  SpO2: 96% 99%  100%  Weight:   75.8 kg (167 lb 2 oz)   Height:        Intake/Output Summary (Last 24 hours) at 03/06/17 1714 Last data filed at 03/06/17 1133  Gross per 24 hour  Intake             1950 ml  Output             1450 ml  Net  500 ml   Filed Weights   03/02/17 0426 03/03/17 0500 03/06/17 0500  Weight: 74.5 kg (164 lb 3.9 oz) 74.8 kg (164 lb 14.5 oz) 75.8 kg (167 lb 2 oz)    Examination: no change fro yesterday.  General exam: Appears calm and comfortable off oxygen.  Respiratory system: air entry fair, no wheezing or rhonchi.  Cardiovascular system: S1 & S2 heard, RRR. No JVD, murmurs, rubs, gallops or clicks. No pedal edema. Gastrointestinal system: Abdomen is soft NT ND BS+ Central nervous system: Alert and no new deficits.  Extremities: Symmetric 5 x 5 power. Skin: stage 2 sacral pressure ulcer.  Psychiatry: cannot be assessed.     Data Reviewed: I have personally reviewed following labs and imaging studies  CBC:  Recent Labs Lab 02/28/17 0407 02/28/17 0411  03/01/17 0420 03/03/17 0455 03/05/17 0247 03/06/17 1400  WBC  --  27.2* 21.9* 7.5 9.6  --   NEUTROABS  --  24.8* 20.3* 6.0  --   --   HGB 12.2* 11.9* 12.8* 9.7* 10.3*  --   HCT 36.0* 37.5* 38.9* 30.2* 32.0*  --   MCV  --  102.2* 99.0 96.5 98.5  --   PLT  --  73* 78* 53* 79* 104*   Basic Metabolic Panel:  Recent Labs Lab 02/28/17 0918  02/28/17 2250 03/01/17 0420 03/02/17 0416 03/02/17 2010 03/03/17 0455 03/04/17 0419 03/05/17 0247  NA  --   < >  --  138 143 145 147*  --  145  K  --   < >  --  3.3* 2.7* 3.6 3.4*  --  3.5  CL  --   < >  --  111 117* 118* 119*  --  116*  CO2  --   < >  --  19* 21* 22 23  --  22  GLUCOSE  --   < >  --  110* 144* 130* 106*  --  108*  BUN  --   < >  --  39* 37* 36* 37*  --  38*  CREATININE  --   < >  --  1.25* 1.09 0.96 0.92  --  0.78  CALCIUM  --   < >  --  8.4* 8.2* 8.5* 8.3*  --  8.1*  MG 1.6*  --  1.7 1.8  --   --   --   --   --   PHOS 2.4*  --  1.2* 1.3*  --   --   --  3.2  --   < > = values in this interval not displayed. GFR: Estimated Creatinine Clearance: 106.6 mL/min (by C-G formula based on SCr of 0.78 mg/dL). Liver Function Tests:  Recent Labs Lab 02/28/17 0411  AST 45*  ALT 44  ALKPHOS 115  BILITOT 0.6  PROT 4.9*  ALBUMIN 2.6*   No results for input(s): LIPASE, AMYLASE in the last 168 hours.  Recent Labs Lab 02/28/17 0820  AMMONIA 113*   Coagulation Profile: No results for input(s): INR, PROTIME in the last 168 hours. Cardiac Enzymes:  Recent Labs Lab 02/28/17 0411  CKTOTAL 36*   BNP (last 3 results) No results for input(s): PROBNP in the last 8760 hours. HbA1C: No results for input(s): HGBA1C in the last 72 hours. CBG:  Recent Labs Lab 03/05/17 1633 03/05/17 2150 03/06/17 0009 03/06/17 0455 03/06/17 0853  GLUCAP 90 96 91 78 98   Lipid Profile: No results for input(s): CHOL, HDL, LDLCALC, TRIG, CHOLHDL, LDLDIRECT  in the last 72 hours. Thyroid Function Tests: No results for input(s): TSH,  T4TOTAL, FREET4, T3FREE, THYROIDAB in the last 72 hours. Anemia Panel: No results for input(s): VITAMINB12, FOLATE, FERRITIN, TIBC, IRON, RETICCTPCT in the last 72 hours. Sepsis Labs:  Recent Labs Lab 02/28/17 0407 02/28/17 0549 02/28/17 0801  LATICACIDVEN 4.88* 5.5* 3.5*    Recent Results (from the past 240 hour(s))  Blood Culture (routine x 2)     Status: Abnormal   Collection Time: 02/28/17  4:05 AM  Result Value Ref Range Status   Specimen Description BLOOD LEFT HAND  Final   Special Requests IN PEDIATRIC BOTTLE Blood Culture adequate volume  Final   Culture  Setup Time   Final    GRAM NEGATIVE RODS IN PEDIATRIC BOTTLE CRITICAL RESULT CALLED TO, READ BACK BY AND VERIFIED WITH: R. CLARK,PHARMD 2007 02/28/2017 T. TYSOR    Culture PROVIDENCIA STUARTII (A)  Final   Report Status 03/02/2017 FINAL  Final   Organism ID, Bacteria PROVIDENCIA STUARTII  Final      Susceptibility   Providencia stuartii - MIC*    AMPICILLIN >=32 RESISTANT Resistant     CEFAZOLIN >=64 RESISTANT Resistant     CEFEPIME <=1 SENSITIVE Sensitive     CEFTAZIDIME <=1 SENSITIVE Sensitive     CEFTRIAXONE <=1 SENSITIVE Sensitive     CIPROFLOXACIN >=4 RESISTANT Resistant     GENTAMICIN RESISTANT Resistant     IMIPENEM 1 SENSITIVE Sensitive     TRIMETH/SULFA >=320 RESISTANT Resistant     AMPICILLIN/SULBACTAM >=32 RESISTANT Resistant     PIP/TAZO <=4 SENSITIVE Sensitive     * PROVIDENCIA STUARTII  Blood Culture ID Panel (Reflexed)     Status: None   Collection Time: 02/28/17  4:05 AM  Result Value Ref Range Status   Enterococcus species NOT DETECTED NOT DETECTED Final   Listeria monocytogenes NOT DETECTED NOT DETECTED Final   Staphylococcus species NOT DETECTED NOT DETECTED Final   Staphylococcus aureus NOT DETECTED NOT DETECTED Final   Streptococcus species NOT DETECTED NOT DETECTED Final   Streptococcus agalactiae NOT DETECTED NOT DETECTED Final   Streptococcus pneumoniae NOT DETECTED NOT DETECTED Final     Streptococcus pyogenes NOT DETECTED NOT DETECTED Final   Acinetobacter baumannii NOT DETECTED NOT DETECTED Final   Enterobacteriaceae species NOT DETECTED NOT DETECTED Final   Enterobacter cloacae complex NOT DETECTED NOT DETECTED Final   Escherichia coli NOT DETECTED NOT DETECTED Final   Klebsiella oxytoca NOT DETECTED NOT DETECTED Final   Klebsiella pneumoniae NOT DETECTED NOT DETECTED Final   Proteus species NOT DETECTED NOT DETECTED Final   Serratia marcescens NOT DETECTED NOT DETECTED Final   Haemophilus influenzae NOT DETECTED NOT DETECTED Final   Neisseria meningitidis NOT DETECTED NOT DETECTED Final   Pseudomonas aeruginosa NOT DETECTED NOT DETECTED Final   Candida albicans NOT DETECTED NOT DETECTED Final   Candida glabrata NOT DETECTED NOT DETECTED Final   Candida krusei NOT DETECTED NOT DETECTED Final   Candida parapsilosis NOT DETECTED NOT DETECTED Final   Candida tropicalis NOT DETECTED NOT DETECTED Final  Blood Culture (routine x 2)     Status: None   Collection Time: 02/28/17  5:08 AM  Result Value Ref Range Status   Specimen Description BLOOD RIGHT HAND  Final   Special Requests IN PEDIATRIC BOTTLE Blood Culture adequate volume  Final   Culture NO GROWTH 5 DAYS  Final   Report Status 03/05/2017 FINAL  Final  MRSA PCR Screening     Status: None  Collection Time: 02/28/17  6:37 AM  Result Value Ref Range Status   MRSA by PCR NEGATIVE NEGATIVE Final    Comment:        The GeneXpert MRSA Assay (FDA approved for NASAL specimens only), is one component of a comprehensive MRSA colonization surveillance program. It is not intended to diagnose MRSA infection nor to guide or monitor treatment for MRSA infections.   Urine Culture     Status: Abnormal   Collection Time: 03/01/17  8:57 AM  Result Value Ref Range Status   Specimen Description URINE, RANDOM  Final   Special Requests NONE  Final   Culture <10,000 COLONIES/mL INSIGNIFICANT GROWTH (A)  Final   Report  Status 03/02/2017 FINAL  Final  Culture, blood (Routine X 2) w Reflex to ID Panel     Status: None (Preliminary result)   Collection Time: 03/05/17  6:08 PM  Result Value Ref Range Status   Specimen Description BLOOD BLOOD RIGHT FOREARM  Final   Special Requests   Final    BOTTLES DRAWN AEROBIC AND ANAEROBIC Blood Culture adequate volume   Culture NO GROWTH < 24 HOURS  Final   Report Status PENDING  Incomplete  Culture, blood (Routine X 2) w Reflex to ID Panel     Status: None (Preliminary result)   Collection Time: 03/05/17  6:09 PM  Result Value Ref Range Status   Specimen Description BLOOD RIGHT ANTECUBITAL  Final   Special Requests   Final    BOTTLES DRAWN AEROBIC AND ANAEROBIC Blood Culture adequate volume   Culture NO GROWTH < 24 HOURS  Final   Report Status PENDING  Incomplete         Radiology Studies: No results found.      Scheduled Meds: . chlorhexidine  15 mL Mouth Rinse BID  . fentaNYL  50 mcg Transdermal Q72H  . free water  200 mL Per Tube Q4H  . heparin  5,000 Units Subcutaneous Q8H  . lacosamide  100 mg Per Tube BID  . levETIRAcetam  250 mg Per Tube BID  . mouth rinse  15 mL Mouth Rinse q12n4p  . OXcarbazepine  600 mg Per Tube QHS  . pantoprazole sodium  40 mg Per Tube Q1200  . topiramate  100 mg Per Tube BID   Continuous Infusions: . ceFEPime (MAXIPIME) IV Stopped (03/06/17 1324)  . feeding supplement (GLUCERNA 1.2 CAL) 1,000 mL (03/05/17 0600)     LOS: 6 days    Time spent: 35 minutes.    Kathlen Mody, MD [facility] Pager [phone]  If 7PM-7AM, please contact night-coverage [email or web address] Password [email or web address] 03/06/2017, 5:14 PM

## 2017-03-07 DIAGNOSIS — R4182 Altered mental status, unspecified: Secondary | ICD-10-CM

## 2017-03-07 LAB — CBC
HEMATOCRIT: 33 % — AB (ref 39.0–52.0)
Hemoglobin: 10.5 g/dL — ABNORMAL LOW (ref 13.0–17.0)
MCH: 31.5 pg (ref 26.0–34.0)
MCHC: 31.8 g/dL (ref 30.0–36.0)
MCV: 99.1 fL (ref 78.0–100.0)
Platelets: 114 10*3/uL — ABNORMAL LOW (ref 150–400)
RBC: 3.33 MIL/uL — AB (ref 4.22–5.81)
RDW: 13.2 % (ref 11.5–15.5)
WBC: 9.6 10*3/uL (ref 4.0–10.5)

## 2017-03-07 LAB — BASIC METABOLIC PANEL
ANION GAP: 9 (ref 5–15)
BUN: 30 mg/dL — ABNORMAL HIGH (ref 6–20)
CHLORIDE: 106 mmol/L (ref 101–111)
CO2: 20 mmol/L — AB (ref 22–32)
Calcium: 7.8 mg/dL — ABNORMAL LOW (ref 8.9–10.3)
Creatinine, Ser: 0.7 mg/dL (ref 0.61–1.24)
GFR calc Af Amer: >60 mL/min (ref >60)
GFR calc non Af Amer: >60 mL/min (ref >60)
GLUCOSE: 58 mg/dL — AB (ref 65–99)
POTASSIUM: 3.1 mmol/L — AB (ref 3.5–5.1)
Sodium: 135 mmol/L (ref 135–145)

## 2017-03-07 LAB — GLUCOSE, CAPILLARY
GLUCOSE-CAPILLARY: 91 mg/dL (ref 65–99)
Glucose-Capillary: 90 mg/dL (ref 65–99)
Glucose-Capillary: 85 mg/dL (ref 65–99)
Glucose-Capillary: 94 mg/dL (ref 65–99)

## 2017-03-07 MED ORDER — POTASSIUM CHLORIDE 20 MEQ/15ML (10%) PO SOLN
40.0000 meq | Freq: Four times a day (QID) | ORAL | Status: AC
  Administered 2017-03-07 (×2): 40 meq via ORAL
  Filled 2017-03-07 (×2): qty 30

## 2017-03-07 MED ORDER — LACOSAMIDE 100 MG PO TABS: ORAL_TABLET | ORAL | 0 refills | Status: DC

## 2017-03-07 MED ORDER — SACCHAROMYCES BOULARDII 250 MG PO CAPS: 250.0000 mg | ORAL_CAPSULE | Freq: Two times a day (BID) | ORAL | 0 refills | Status: DC

## 2017-03-07 MED ORDER — OXYCODONE HCL 10 MG PO TABS: 10.0000 mg | ORAL_TABLET | Freq: Four times a day (QID) | ORAL | 0 refills | Status: DC | PRN

## 2017-03-07 MED ORDER — FENTANYL 50 MCG/HR TD PT72
50.0000 ug | MEDICATED_PATCH | TRANSDERMAL | 0 refills | Status: DC
Start: 2017-03-07 — End: 2017-05-27

## 2017-03-07 MED ORDER — CEFUROXIME AXETIL 500 MG PO TABS: 500.0000 mg | ORAL_TABLET | Freq: Two times a day (BID) | ORAL | 0 refills | Status: AC

## 2017-03-07 MED ORDER — MAGNESIUM SULFATE IN D5W 1-5 GM/100ML-% IV SOLN
1.0000 g | Freq: Once | INTRAVENOUS | Status: AC
  Administered 2017-03-07: 1 g via INTRAVENOUS
  Filled 2017-03-07: qty 100

## 2017-03-07 NOTE — Care Management Important Message (Signed)
Important Message  Patient Details  Name: Alexander [name] MRN: [id number] Date of Birth: Sep 03, 1955   Medicare Important Message Given:  Yes Sign on 03/05/2017      Alexander [name] 03/07/2017, 8:23 AM

## 2017-03-07 NOTE — Social Work (Signed)
Clinical Social Worker facilitated patient discharge including contacting patient family and facility to confirm patient discharge plans.  Clinical information faxed to facility and family agreeable with plan.    CSW [facility].    RN to call [phone] to give report prior to discharge. Pt going to Rm 124.  Clinical Social Worker will sign off for now as social work intervention is no longer needed. Please consult us again if new need arises.  Keene BreathPatricia Stone Spirito, LCSW Clinical Social Worker [phone]

## 2017-03-07 NOTE — Social Work (Signed)
CSW discussed bed offers with patient. [facility].  CSW confirmed bed placement with Rhonda in admissions.  CSW called mother and brother and unable to reach. CSW will continue to f/u as family would need to assist with paperwork for admission.  CSW will continue to follow.  Keene BreathPatricia Alaja Goldinger, LCSW Clinical Social Worker [phone]

## 2017-03-07 NOTE — Clinical Social Work Placement (Signed)
   CLINICAL SOCIAL WORK PLACEMENT  NOTE  Date:  03/07/2017  Patient Details  Name: Alexander FreudMartin A Narine MRN: [id number] Date of Birth: [birth year]  Clinical Social Work is seeking post-discharge placement for this patient at the Skilled  Nursing Facility level of care (*CSW will initial, date and re-position this form in  chart as items are completed):  Yes   Patient/family provided with [facility] Clinical Social Work Department's list of facilities offering this level of care within the geographic area requested by the patient (or if unable, by the patient's family).  Yes   Patient/family informed of their freedom to choose among providers that offer the needed level of care, that participate in Medicare, Medicaid or managed care program needed by the patient, have an available bed and are willing to accept the patient.  Yes   Patient/family informed of [facility]'s ownership interest in [facility] and [facility], as well as of the fact that they are under no obligation to receive care at these facilities.  PASRR submitted to EDS on       PASRR number received on       Existing PASRR number confirmed on 03/06/17     FL2 transmitted to all facilities in geographic area requested by pt/family on       FL2 transmitted to all facilities within larger geographic area on 03/06/17     Patient informed that his/her managed care company has contracts with or will negotiate with certain facilities, including the following:        Yes   Patient/family informed of bed offers received.  [facility]     Physician recommends and patient chooses bed at      [facility].  Patient to be transferred to facility by [facility]     Patient family notified on 03/07/17 of transfer.  Name of family member notified:  called mom      PHYSICIAN Please prepare priority discharge summary, including  medications, Please prepare prescriptions, Please sign FL2     Additional Comment:    _______________________________________________ Tresa MoorePatricia V [place] Steidle, LCSW 03/07/2017, 10:23 AM

## 2017-03-07 NOTE — Discharge Summary (Signed)
Physician Discharge Summary  Drystan Reader Farnell UJW:[id number] DOB: 1956-02-24 DOA: 02/28/2017  PCP: Justin Mend, MD  Admit date: 02/28/2017 Discharge date: 03/07/2017  Admitted From: ALF Disposition: ALF or SNF  Recommendations for Outpatient Follow-up:  1. Follow up with PCP in 1-2 weeks 2. Please obtain BMP/CBC in one week. 3. Ceftin for 8 more days. Needs pulmonary toilet. Once a day  Home Health: NA Equipment/Devices:NA  Discharge Condition: Stable CODE STATUS: Full Code Diet recommendation: Diet - low sodium heart healthy  Brief/Interim Summary: 61 y/o male with a history of TBI admitted for unresponsiveness and hypotension presumably in the setting of aspiration pneumonia. he was found to have providencia bacteremia.  He was admitted to [facility] service and intubated on 8/29 and extubated the next day, also started on levophed.  he was weaned off levophed and transferred to [facility] service on 9/2.  Discharge Diagnoses:  Active Problems:   Seizure (HCC)   HCAP (healthcare-associated pneumonia)   TBI (traumatic brain injury) (HCC)   Septic shock (HCC)   Acute respiratory failure with hypoxemia (HCC)   Pressure injury of skin   Acute on chronic respiratory failure with hypoxia (HCC)   Septic shock secondary to aspiration pneumonia and PROVIDENCIA bacteremia:  He was initially intubated and extubated on 8/30, started on levophed, as his bp improved, he was weaned off levophed.  Started on IV cefepime and transferred to [facility] service on 9/2. Repeat blood cultures Done on 03/05/2017 NGTD. Received IV cefepime for 7 days, discharged on Ceftin to complete a total of 2 weeks of antibiotics.  TBI:  Stable.   Acute respiratory failure with hypoxemia:  Resolved. Pulmonary toilet.  Decreased the rate of the tube feeds from 70 ml to 40 ml/hr and his congestion and secretions improved.   Pressure ulcer over the sacrum:  wound care consulted and recommendations given.    Acute kidney injury:  Secondary to dehydration, decreased po intake.  Added free water, and hydrated.  Renal parameters improved.   Acute metabolic encephalopathy :  He is currently back to baseline.   H/o seizures:  No seizure activity.  Resume home meds.   Hypokalemia:  Potassium of 3.1 the day of discharge, given total of 80 mEq, check BMP in 1 week.  Nutrition:  Resume tube feeds. Decreased the rate of tube feeds to 40 ml/hr, can slowly go up on his recommended rate.   Anemia of acute illness:  Baseline around 12, currently around 9. Hemoglobin stable around 10.  Monitor   Thrombocytopenia:  This is likely secondary to infection, was down to 53, now improved to 114  Chronic narcotic use:  Resume duragesic patch at lower rate at 50 g/hour.   Hypernatremia:  Resolved after added free water, consider lowering free water flushes.  Leukocytosis :  resolved.   Discharge Instructions  Discharge Instructions    Diet - low sodium heart healthy    Complete by:  As directed    Increase activity slowly    Complete by:  As directed      Allergies as of 03/07/2017      Reactions   Amoxicillin Itching, Rash, Other (See Comments)   NOTED ON MAR Has patient had a PCN reaction causing immediate rash, facial/tongue/throat swelling, SOB or lightheadedness with hypotension: Unknown Has patient had a PCN reaction causing severe rash involving mucus membranes or skin necrosis: Unknown Has patient had a PCN reaction that required hospitalization Unknown Has patient had a PCN reaction occurring within the  last 10 years: Unknown If all of the above answers are "NO", then may proceed with Cephalosporin use.      Medication List    STOP taking these medications   fentaNYL 75 MCG/HR Commonly known as:  DURAGESIC - dosed mcg/hr Replaced by:  fentaNYL 50 MCG/HR     TAKE these medications   acetaminophen 650 MG CR tablet Commonly known as:  TYLENOL Take 650 mg by  mouth every 4 (four) hours as needed for pain.   allopurinol 300 MG tablet Commonly known as:  ZYLOPRIM 300 mg by PEG Tube route daily.   BIOFREEZE EX Apply 1 application topically every 8 (eight) hours.   busPIRone 15 MG tablet Commonly known as:  BUSPAR Take 15 mg by mouth 2 (two) times daily.   carboxymethylcellulose 0.5 % Soln Commonly known as:  REFRESH PLUS Place 1 drop into both eyes daily.   cefUROXime 500 MG tablet Commonly known as:  CEFTIN Take 1 tablet (500 mg total) by mouth 2 (two) times daily.   celecoxib 200 MG capsule Commonly known as:  CELEBREX Place 200 mg into feeding tube daily.   CLEAR EYES COMPLETE OP Place 1 drop into both eyes daily.   fentaNYL 50 MCG/HR Commonly known as:  DURAGESIC - dosed mcg/hr Place 1 patch (50 mcg total) onto the skin every 3 (three) days. Replaces:  fentaNYL 75 MCG/HR   finasteride 5 MG tablet Commonly known as:  PROSCAR 5 mg daily. Per tube   fluticasone 50 MCG/ACT nasal spray Commonly known as:  FLONASE Place 1 spray into both nostrils daily.   gabapentin 300 MG capsule Commonly known as:  NEURONTIN Place 300 mg into feeding tube 3 (three) times daily.   ICY HOT EXTRA STRENGTH 10-30 % Crea Apply topically 3 (three) times daily. Applied to left shoulder   ipratropium-albuterol 0.5-2.5 (3) MG/3ML Soln Commonly known as:  DUONEB Take 3 mLs by nebulization every 6 (six) hours as needed (for shortness of breath).   Lacosamide 100 MG Tabs Commonly known as:  VIMPAT Take one tablet by mouth via tube twice daily for seizures What changed:  how much to take  how to take this  when to take this  additional instructions   levETIRAcetam 100 MG/ML solution Commonly known as:  KEPPRA Place 250 mg into feeding tube 2 (two) times daily.   lidocaine 5 % ointment Commonly known as:  XYLOCAINE Apply 1 application topically daily. Applied around G-Tube   Melatonin 10 MG Tabs Give 10 mg by tube at bedtime.    MUCINEX FAST-MAX 10-650-400 MG/20ML Liqd Generic drug:  Phenylephrine-APAP-Guaifenesin Place 15 mLs into feeding tube every 12 (twelve) hours.   NEXIUM 40 MG packet Generic drug:  esomeprazole 40 mg daily before breakfast. Per tube   Oxcarbazepine 300 MG tablet Commonly known as:  TRILEPTAL Place 600 mg into feeding tube at bedtime.   Oxycodone HCl 10 MG Tabs Place 1 tablet (10 mg total) into feeding tube every 6 (six) hours as needed (For pain.).   polyethylene glycol packet Commonly known as:  MIRALAX / GLYCOLAX Place 17 g into feeding tube daily. Mix with 8 oz water and place in tube every night at bedtime What changed:  additional instructions   saccharomyces boulardii 250 MG capsule Commonly known as:  FLORASTOR Take 1 capsule (250 mg total) by mouth 2 (two) times daily.   topiramate 100 MG tablet Commonly known as:  TOPAMAX Take 1 tablet (100 mg total) by mouth 2 (two) times  daily. PEG tube What changed:  how to take this  additional instructions   traZODone 100 MG tablet Commonly known as:  DESYREL 100 mg by PEG Tube route at bedtime.   venlafaxine 100 MG tablet Commonly known as:  EFFEXOR 75 mg by PEG Tube route 3 (three) times daily.   Vitamin D (Ergocalciferol) 50000 units Caps capsule Commonly known as:  DRISDOL Take 50,000 Units by mouth every 30 (thirty) days.            Discharge Care Instructions        Start     Ordered   03/07/17 0000  fentaNYL (DURAGESIC - DOSED MCG/HR) 50 MCG/HR  every 72 hours     03/07/17 0912   03/07/17 0000  Lacosamide (VIMPAT) 100 MG TABS     03/07/17 0912   03/07/17 0000  Oxycodone HCl 10 MG TABS  Every 6 hours PRN     03/07/17 0912   03/07/17 0000  Increase activity slowly     03/07/17 0912   03/07/17 0000  Diet - low sodium heart healthy     03/07/17 0912   03/07/17 0000  cefUROXime (CEFTIN) 500 MG tablet  2 times daily     03/07/17 0912   03/07/17 0000  saccharomyces boulardii (FLORASTOR) 250 MG capsule   2 times daily     03/07/17 0912      Allergies  Allergen Reactions  . Amoxicillin Itching, Rash and Other (See Comments)    NOTED ON MAR Has patient had a PCN reaction causing immediate rash, facial/tongue/throat swelling, SOB or lightheadedness with hypotension: Unknown Has patient had a PCN reaction causing severe rash involving mucus membranes or skin necrosis: Unknown Has patient had a PCN reaction that required hospitalization Unknown Has patient had a PCN reaction occurring within the last 10 years: Unknown If all of the above answers are "NO", then may proceed with Cephalosporin use.     Consultations:  Was under PCCM   Procedures (Echo, Carotid, EGD, Colonoscopy, ERCP)   Radiological studies: Ct Head Wo Contrast  Result Date: 02/28/2017 CLINICAL DATA:  Altered level consciousness, unresponsive for 2 days. History of hypertension, diabetes, seizures, supranuclear palsy. EXAM: CT HEAD WITHOUT CONTRAST TECHNIQUE: Contiguous axial images were obtained from the base of the skull through the vertex without intravenous contrast. COMPARISON:  CT HEAD December 24, 2015 and MRI of the head December 29, 2014 FINDINGS: BRAIN: No intraparenchymal hemorrhage, mass effect nor midline shift. Mild general parenchymal brain volume loss for age. No hydrocephalus. RIGHT inferior basal ganglia perivascular space. No acute large vascular territory infarcts. No abnormal extra-axial fluid collections. Basal cisterns are patent. VASCULAR: Moderate calcific atherosclerosis of the carotid siphons. SKULL: No skull fracture. No significant scalp soft tissue swelling. SINUSES/ORBITS: The mastoid air-cells and included paranasal sinuses are well-aerated.The included ocular globes and orbital contents are non-suspicious. Old bilateral medial orbital wall fractures. OTHER: Extensive cervical spine hardware incompletely assessed. IMPRESSION: 1. No acute intracranial process. 2. Stable examination mild parenchymal brain  volume loss for age. Electronically Signed   By: Awilda Metroourtnay  Bloomer M.D.   On: 02/28/2017 06:27   Dg Chest Port 1 View  Result Date: 03/03/2017 CLINICAL DATA:  Pneumonia. EXAM: PORTABLE CHEST 1 VIEW COMPARISON:  One-view chest x-ray 03/01/2017 FINDINGS: The heart size is exaggerated by low lung volumes. Moderate pulmonary vascular congestion and edema has increased. Bibasilar airspace disease continues to increase, left greater than right. Small effusions are suspected. A left IJ line is stable.  The  patient has been extubated. IMPRESSION: 1. Increasing pulmonary vascular congestion and edema. 2. Increasing bibasilar airspace disease, left greater than right. This is worrisome for bilateral lobar pneumonia. 3. Small bilateral pleural effusions are present. Electronically Signed   By: Marin Roberts M.D.   On: 03/03/2017 08:43   Dg Chest Port 1 View  Result Date: 03/01/2017 CLINICAL DATA:  Respiratory failure. EXAM: PORTABLE CHEST 1 VIEW COMPARISON:  02/28/2017. FINDINGS: Endotracheal tube, left IJ line, NG tube in stable position. Heart size normal. Progressive basilar atelectasis. Progressive left base infiltrate. Small bilateral pleural effusions. Scratched it tiny bilateral pleural effusions. No pneumothorax. Surgical clips right upper quadrant. IMPRESSION: 1. Lines and tubes in stable position. 2. Progressive bibasilar atelectasis. Progressive left base infiltrate. Electronically Signed   By: Maisie Fus  Register   On: 03/01/2017 06:40   Dg Chest Portable 1 View  Result Date: 02/28/2017 CLINICAL DATA:  Central line placement. EXAM: PORTABLE CHEST 1 VIEW COMPARISON:  02/28/2017 at 04:05 FINDINGS: New left jugular central line extends to the expected location of the right atrium. No pneumothorax. Endotracheal tube tip is 2.6 cm above the carina. Enteric tube extends well into the stomach. Unchanged patchy lung base opacities bilaterally. IMPRESSION: 1. New left jugular central line extending to the  right atrium. No pneumothorax. 2. Continued satisfactory position of the ET and OG tubes. 3. Unchanged bilateral airspace opacities. Electronically Signed   By: Ellery Plunk M.D.   On: 02/28/2017 06:13   Dg Chest Portable 1 View  Result Date: 02/28/2017 CLINICAL DATA:  Respiratory failure.  Intubation EXAM: PORTABLE CHEST 1 VIEW COMPARISON:  None. FINDINGS: Endotracheal tube is 2.8 cm above the carina. Orogastric tube extends well into the stomach and beyond the inferior edge of the image. Patchy lung base opacities, left greater than right. No large pneumothorax or large effusion. IMPRESSION: 1. Satisfactorily positioned support equipment. 2. Patchy lung base opacities, left greater than right. Electronically Signed   By: Ellery Plunk M.D.   On: 02/28/2017 04:40     Subjective:  Discharge Exam: Vitals:   03/06/17 0500 03/06/17 1536 03/06/17 2038 03/07/17 0422  BP:  134/88 122/67 130/64  Pulse:  74 79 60  Resp:  20 20 20   Temp:  99 F (37.2 C) 98.7 F (37.1 C) 97.9 F (36.6 C)  TempSrc:  Oral Oral Oral  SpO2:  100% 100% 100%  Weight: 75.8 kg (167 lb 2 oz)   75.8 kg (167 lb 2 oz)  Height:       General: Pt is alert, awake, not in acute distress Cardiovascular: RRR, S1/S2 +, no rubs, no gallops Respiratory: CTA bilaterally, no wheezing, no rhonchi Abdominal: Soft, NT, ND, bowel sounds + Extremities: no edema, no cyanosis   The results of significant diagnostics from this hospitalization (including imaging, microbiology, ancillary and laboratory) are listed below for reference.    Microbiology: Recent Results (from the past 240 hour(s))  Blood Culture (routine x 2)     Status: Abnormal   Collection Time: 02/28/17  4:05 AM  Result Value Ref Range Status   Specimen Description BLOOD LEFT HAND  Final   Special Requests IN PEDIATRIC BOTTLE Blood Culture adequate volume  Final   Culture  Setup Time   Final    GRAM NEGATIVE RODS IN PEDIATRIC BOTTLE CRITICAL RESULT CALLED  TO, READ BACK BY AND VERIFIED WITH: R. CLARK,PHARMD 2007 02/28/2017 T. TYSOR    Culture PROVIDENCIA STUARTII (A)  Final   Report Status 03/02/2017 FINAL  Final  Organism ID, Bacteria PROVIDENCIA STUARTII  Final      Susceptibility   Providencia stuartii - MIC*    AMPICILLIN >=32 RESISTANT Resistant     CEFAZOLIN >=64 RESISTANT Resistant     CEFEPIME <=1 SENSITIVE Sensitive     CEFTAZIDIME <=1 SENSITIVE Sensitive     CEFTRIAXONE <=1 SENSITIVE Sensitive     CIPROFLOXACIN >=4 RESISTANT Resistant     GENTAMICIN RESISTANT Resistant     IMIPENEM 1 SENSITIVE Sensitive     TRIMETH/SULFA >=320 RESISTANT Resistant     AMPICILLIN/SULBACTAM >=32 RESISTANT Resistant     PIP/TAZO <=4 SENSITIVE Sensitive     * PROVIDENCIA STUARTII  Blood Culture ID Panel (Reflexed)     Status: None   Collection Time: 02/28/17  4:05 AM  Result Value Ref Range Status   Enterococcus species NOT DETECTED NOT DETECTED Final   Listeria monocytogenes NOT DETECTED NOT DETECTED Final   Staphylococcus species NOT DETECTED NOT DETECTED Final   Staphylococcus aureus NOT DETECTED NOT DETECTED Final   Streptococcus species NOT DETECTED NOT DETECTED Final   Streptococcus agalactiae NOT DETECTED NOT DETECTED Final   Streptococcus pneumoniae NOT DETECTED NOT DETECTED Final   Streptococcus pyogenes NOT DETECTED NOT DETECTED Final   Acinetobacter baumannii NOT DETECTED NOT DETECTED Final   Enterobacteriaceae species NOT DETECTED NOT DETECTED Final   Enterobacter cloacae complex NOT DETECTED NOT DETECTED Final   Escherichia coli NOT DETECTED NOT DETECTED Final   Klebsiella oxytoca NOT DETECTED NOT DETECTED Final   Klebsiella pneumoniae NOT DETECTED NOT DETECTED Final   Proteus species NOT DETECTED NOT DETECTED Final   Serratia marcescens NOT DETECTED NOT DETECTED Final   Haemophilus influenzae NOT DETECTED NOT DETECTED Final   Neisseria meningitidis NOT DETECTED NOT DETECTED Final   Pseudomonas aeruginosa NOT DETECTED NOT  DETECTED Final   Candida albicans NOT DETECTED NOT DETECTED Final   Candida glabrata NOT DETECTED NOT DETECTED Final   Candida krusei NOT DETECTED NOT DETECTED Final   Candida parapsilosis NOT DETECTED NOT DETECTED Final   Candida tropicalis NOT DETECTED NOT DETECTED Final  Blood Culture (routine x 2)     Status: None   Collection Time: 02/28/17  5:08 AM  Result Value Ref Range Status   Specimen Description BLOOD RIGHT HAND  Final   Special Requests IN PEDIATRIC BOTTLE Blood Culture adequate volume  Final   Culture NO GROWTH 5 DAYS  Final   Report Status 03/05/2017 FINAL  Final  MRSA PCR Screening     Status: None   Collection Time: 02/28/17  6:37 AM  Result Value Ref Range Status   MRSA by PCR NEGATIVE NEGATIVE Final    Comment:        The GeneXpert MRSA Assay (FDA approved for NASAL specimens only), is one component of a comprehensive MRSA colonization surveillance program. It is not intended to diagnose MRSA infection nor to guide or monitor treatment for MRSA infections.   Urine Culture     Status: Abnormal   Collection Time: 03/01/17  8:57 AM  Result Value Ref Range Status   Specimen Description URINE, RANDOM  Final   Special Requests NONE  Final   Culture <10,000 COLONIES/mL INSIGNIFICANT GROWTH (A)  Final   Report Status 03/02/2017 FINAL  Final  Culture, blood (Routine X 2) w Reflex to ID Panel     Status: None (Preliminary result)   Collection Time: 03/05/17  6:08 PM  Result Value Ref Range Status   Specimen Description BLOOD BLOOD RIGHT FOREARM  Final   Special Requests   Final    BOTTLES DRAWN AEROBIC AND ANAEROBIC Blood Culture adequate volume   Culture NO GROWTH 2 DAYS  Final   Report Status PENDING  Incomplete  Culture, blood (Routine X 2) w Reflex to ID Panel     Status: None (Preliminary result)   Collection Time: 03/05/17  6:09 PM  Result Value Ref Range Status   Specimen Description BLOOD RIGHT ANTECUBITAL  Final   Special Requests   Final    BOTTLES  DRAWN AEROBIC AND ANAEROBIC Blood Culture adequate volume   Culture NO GROWTH 2 DAYS  Final   Report Status PENDING  Incomplete     Labs: BNP (last 3 results) No results for input(s): BNP in the last 8760 hours. Basic Metabolic Panel:  Recent Labs Lab 02/28/17 2250 03/01/17 0420 03/02/17 0416 03/02/17 2010 03/03/17 0455 03/04/17 0419 03/05/17 0247 03/07/17 0300  NA  --  138 143 145 147*  --  145 135  K  --  3.3* 2.7* 3.6 3.4*  --  3.5 3.1*  CL  --  111 117* 118* 119*  --  116* 106  CO2  --  19* 21* 22 23  --  22 20*  GLUCOSE  --  110* 144* 130* 106*  --  108* 58*  BUN  --  39* 37* 36* 37*  --  38* 30*  CREATININE  --  1.25* 1.09 0.96 0.92  --  0.78 0.70  CALCIUM  --  8.4* 8.2* 8.5* 8.3*  --  8.1* 7.8*  MG 1.7 1.8  --   --   --   --   --   --   PHOS 1.2* 1.3*  --   --   --  3.2  --   --    Liver Function Tests: No results for input(s): AST, ALT, ALKPHOS, BILITOT, PROT, ALBUMIN in the last 168 hours. No results for input(s): LIPASE, AMYLASE in the last 168 hours. No results for input(s): AMMONIA in the last 168 hours. CBC:  Recent Labs Lab 03/01/17 0420 03/03/17 0455 03/05/17 0247 03/06/17 1400 03/07/17 0300  WBC 21.9* 7.5 9.6  --  9.6  NEUTROABS 20.3* 6.0  --   --   --   HGB 12.8* 9.7* 10.3*  --  10.5*  HCT 38.9* 30.2* 32.0*  --  33.0*  MCV 99.0 96.5 98.5  --  99.1  PLT 78* 53* 79* 104* 114*   Cardiac Enzymes: No results for input(s): CKTOTAL, CKMB, CKMBINDEX, TROPONINI in the last 168 hours. BNP: Invalid input(s): POCBNP CBG:  Recent Labs Lab 03/06/17 1332 03/06/17 1648 03/06/17 2018 03/07/17 0108 03/07/17 0618  GLUCAP 101* 94 96 90 85   D-Dimer No results for input(s): DDIMER in the last 72 hours. Hgb A1c No results for input(s): HGBA1C in the last 72 hours. Lipid Profile No results for input(s): CHOL, HDL, LDLCALC, TRIG, CHOLHDL, LDLDIRECT in the last 72 hours. Thyroid function studies No results for input(s): TSH, T4TOTAL, T3FREE, THYROIDAB  in the last 72 hours.  Invalid input(s): FREET3 Anemia work up No results for input(s): VITAMINB12, FOLATE, FERRITIN, TIBC, IRON, RETICCTPCT in the last 72 hours. Urinalysis    Component Value Date/Time   COLORURINE AMBER (A) 02/28/2017 0458   APPEARANCEUR TURBID (A) 02/28/2017 0458   LABSPEC 1.020 02/28/2017 0458   PHURINE 7.0 02/28/2017 0458   GLUCOSEU NEGATIVE 02/28/2017 0458   HGBUR MODERATE (A) 02/28/2017 0458   BILIRUBINUR NEGATIVE 02/28/2017 0458  KETONESUR 5 (A) 02/28/2017 0458   PROTEINUR 100 (A) 02/28/2017 0458   UROBILINOGEN 0.2 09/18/2013 1628   NITRITE NEGATIVE 02/28/2017 0458   LEUKOCYTESUR SMALL (A) 02/28/2017 0458   Sepsis Labs Invalid input(s): PROCALCITONIN,  WBC,  LACTICIDVEN Microbiology Recent Results (from the past 240 hour(s))  Blood Culture (routine x 2)     Status: Abnormal   Collection Time: 02/28/17  4:05 AM  Result Value Ref Range Status   Specimen Description BLOOD LEFT HAND  Final   Special Requests IN PEDIATRIC BOTTLE Blood Culture adequate volume  Final   Culture  Setup Time   Final    GRAM NEGATIVE RODS IN PEDIATRIC BOTTLE CRITICAL RESULT CALLED TO, READ BACK BY AND VERIFIED WITH: R. CLARK,PHARMD 2007 02/28/2017 T. TYSOR    Culture PROVIDENCIA STUARTII (A)  Final   Report Status 03/02/2017 FINAL  Final   Organism ID, Bacteria PROVIDENCIA STUARTII  Final      Susceptibility   Providencia stuartii - MIC*    AMPICILLIN >=32 RESISTANT Resistant     CEFAZOLIN >=64 RESISTANT Resistant     CEFEPIME <=1 SENSITIVE Sensitive     CEFTAZIDIME <=1 SENSITIVE Sensitive     CEFTRIAXONE <=1 SENSITIVE Sensitive     CIPROFLOXACIN >=4 RESISTANT Resistant     GENTAMICIN RESISTANT Resistant     IMIPENEM 1 SENSITIVE Sensitive     TRIMETH/SULFA >=320 RESISTANT Resistant     AMPICILLIN/SULBACTAM >=32 RESISTANT Resistant     PIP/TAZO <=4 SENSITIVE Sensitive     * PROVIDENCIA STUARTII  Blood Culture ID Panel (Reflexed)     Status: None   Collection Time:  02/28/17  4:05 AM  Result Value Ref Range Status   Enterococcus species NOT DETECTED NOT DETECTED Final   Listeria monocytogenes NOT DETECTED NOT DETECTED Final   Staphylococcus species NOT DETECTED NOT DETECTED Final   Staphylococcus aureus NOT DETECTED NOT DETECTED Final   Streptococcus species NOT DETECTED NOT DETECTED Final   Streptococcus agalactiae NOT DETECTED NOT DETECTED Final   Streptococcus pneumoniae NOT DETECTED NOT DETECTED Final   Streptococcus pyogenes NOT DETECTED NOT DETECTED Final   Acinetobacter baumannii NOT DETECTED NOT DETECTED Final   Enterobacteriaceae species NOT DETECTED NOT DETECTED Final   Enterobacter cloacae complex NOT DETECTED NOT DETECTED Final   Escherichia coli NOT DETECTED NOT DETECTED Final   Klebsiella oxytoca NOT DETECTED NOT DETECTED Final   Klebsiella pneumoniae NOT DETECTED NOT DETECTED Final   Proteus species NOT DETECTED NOT DETECTED Final   Serratia marcescens NOT DETECTED NOT DETECTED Final   Haemophilus influenzae NOT DETECTED NOT DETECTED Final   Neisseria meningitidis NOT DETECTED NOT DETECTED Final   Pseudomonas aeruginosa NOT DETECTED NOT DETECTED Final   Candida albicans NOT DETECTED NOT DETECTED Final   Candida glabrata NOT DETECTED NOT DETECTED Final   Candida krusei NOT DETECTED NOT DETECTED Final   Candida parapsilosis NOT DETECTED NOT DETECTED Final   Candida tropicalis NOT DETECTED NOT DETECTED Final  Blood Culture (routine x 2)     Status: None   Collection Time: 02/28/17  5:08 AM  Result Value Ref Range Status   Specimen Description BLOOD RIGHT HAND  Final   Special Requests IN PEDIATRIC BOTTLE Blood Culture adequate volume  Final   Culture NO GROWTH 5 DAYS  Final   Report Status 03/05/2017 FINAL  Final  MRSA PCR Screening     Status: None   Collection Time: 02/28/17  6:37 AM  Result Value Ref Range Status   MRSA by  PCR NEGATIVE NEGATIVE Final    Comment:        The GeneXpert MRSA Assay (FDA approved for NASAL  specimens only), is one component of a comprehensive MRSA colonization surveillance program. It is not intended to diagnose MRSA infection nor to guide or monitor treatment for MRSA infections.   Urine Culture     Status: Abnormal   Collection Time: 03/01/17  8:57 AM  Result Value Ref Range Status   Specimen Description URINE, RANDOM  Final   Special Requests NONE  Final   Culture <10,000 COLONIES/mL INSIGNIFICANT GROWTH (A)  Final   Report Status 03/02/2017 FINAL  Final  Culture, blood (Routine X 2) w Reflex to ID Panel     Status: None (Preliminary result)   Collection Time: 03/05/17  6:08 PM  Result Value Ref Range Status   Specimen Description BLOOD BLOOD RIGHT FOREARM  Final   Special Requests   Final    BOTTLES DRAWN AEROBIC AND ANAEROBIC Blood Culture adequate volume   Culture NO GROWTH 2 DAYS  Final   Report Status PENDING  Incomplete  Culture, blood (Routine X 2) w Reflex to ID Panel     Status: None (Preliminary result)   Collection Time: 03/05/17  6:09 PM  Result Value Ref Range Status   Specimen Description BLOOD RIGHT ANTECUBITAL  Final   Special Requests   Final    BOTTLES DRAWN AEROBIC AND ANAEROBIC Blood Culture adequate volume   Culture NO GROWTH 2 DAYS  Final   Report Status PENDING  Incomplete     Time coordinating discharge: Over 30 minutes  SIGNED:   Clint Lipps, MD  [facility] 03/07/2017, 9:55 AM Pager   If 7PM-7AM, please contact night-coverage [email or web address] Password [email or web address]

## 2017-03-08 ENCOUNTER — Encounter: Payer: Self-pay | Admitting: Internal Medicine

## 2017-03-08 ENCOUNTER — Non-Acute Institutional Stay (SKILLED_NURSING_FACILITY): Payer: Medicare Other | Admitting: Internal Medicine

## 2017-03-08 DIAGNOSIS — G894 Chronic pain syndrome: Secondary | ICD-10-CM | POA: Diagnosis not present

## 2017-03-08 DIAGNOSIS — L89159 Pressure ulcer of sacral region, unspecified stage: Secondary | ICD-10-CM | POA: Diagnosis not present

## 2017-03-08 DIAGNOSIS — J9621 Acute and chronic respiratory failure with hypoxia: Secondary | ICD-10-CM | POA: Diagnosis not present

## 2017-03-08 DIAGNOSIS — Z9189 Other specified personal risk factors, not elsewhere classified: Secondary | ICD-10-CM | POA: Diagnosis not present

## 2017-03-08 DIAGNOSIS — J189 Pneumonia, unspecified organism: Secondary | ICD-10-CM | POA: Diagnosis not present

## 2017-03-08 NOTE — Assessment & Plan Note (Signed)
Pre-[facility] at time of admission to SNF [facility]

## 2017-03-08 NOTE — Assessment & Plan Note (Signed)
O2 sats are now 96% on room air Pulmonary toilet will be initiated as needed
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
## 2017-03-08 NOTE — Assessment & Plan Note (Addendum)
Complete Ceftin Risk of respiratory suppression with narcotics will be monitored

## 2017-03-08 NOTE — Assessment & Plan Note (Signed)
Opioids will be weaned if he exhibits evidence of respiratory suppression Fentanyl was decreased to 50 g every 3 days at discharge from the hospital

## 2017-03-08 NOTE — Patient Instructions (Addendum)
See assessment and plan under each diagnosis in the problem list and acutely for this visit Due to "stare" & prior low normal TSH, TSH will be rechecked.

## 2017-03-08 NOTE — Assessment & Plan Note (Signed)
Monitor for respiratory suppression

## 2017-03-09 LAB — HEMOGLOBIN A1C: Hemoglobin A1C: 5.1

## 2017-03-10 LAB — CULTURE, BLOOD (ROUTINE X 2)
Culture: NO GROWTH
Culture: NO GROWTH
SPECIAL REQUESTS: ADEQUATE
Special Requests: ADEQUATE

## 2017-03-13 ENCOUNTER — Ambulatory Visit: Payer: Self-pay | Admitting: Neurology

## 2017-03-14 LAB — TSH: TSH: 4.15 (ref 0.41–5.90)

## 2017-03-14 LAB — CBC AND DIFFERENTIAL
HEMATOCRIT: 37 — AB (ref 41–53)
Hemoglobin: 12.1 — AB (ref 13.5–17.5)
Neutrophils Absolute: 4
Platelets: 293 (ref 150–399)
WBC: 7

## 2017-03-14 LAB — BASIC METABOLIC PANEL
BUN: 20 (ref 4–21)
CREATININE: 0.5 — AB (ref 0.6–1.3)
GLUCOSE: 99
POTASSIUM: 4.5 (ref 3.4–5.3)
SODIUM: 138 (ref 137–147)

## 2017-03-15 ENCOUNTER — Encounter: Payer: Self-pay | Admitting: Internal Medicine

## 2017-03-15 DIAGNOSIS — T1491XA Suicide attempt, initial encounter: Secondary | ICD-10-CM | POA: Insufficient documentation

## 2017-04-13 ENCOUNTER — Emergency Department (HOSPITAL_COMMUNITY)
Admission: EM | Admit: 2017-04-13 | Discharge: 2017-04-14 | Disposition: A | Discharge status: Skilled Nursing Facility | Payer: Medicare Other | Attending: Emergency Medicine | Admitting: Emergency Medicine

## 2017-04-13 ENCOUNTER — Encounter (HOSPITAL_COMMUNITY): Payer: Self-pay | Admitting: Emergency Medicine

## 2017-04-13 DIAGNOSIS — Z87891 Personal history of nicotine dependence: Secondary | ICD-10-CM | POA: Diagnosis not present

## 2017-04-13 DIAGNOSIS — K9423 Gastrostomy malfunction: Secondary | ICD-10-CM

## 2017-04-13 DIAGNOSIS — E119 Type 2 diabetes mellitus without complications: Secondary | ICD-10-CM | POA: Diagnosis not present

## 2017-04-13 DIAGNOSIS — Z79899 Other long term (current) drug therapy: Secondary | ICD-10-CM | POA: Diagnosis not present

## 2017-04-13 DIAGNOSIS — F0390 Unspecified dementia without behavioral disturbance: Secondary | ICD-10-CM | POA: Diagnosis not present

## 2017-04-13 DIAGNOSIS — I10 Essential (primary) hypertension: Secondary | ICD-10-CM | POA: Insufficient documentation

## 2017-04-13 NOTE — ED Triage Notes (Signed)
Pt is brought in from [facility] for replacement of a PEG tube that he reportedly pulled out. Stoma site assessed as this time; dry, clean and intact with mild traumatic bleeding.

## 2017-04-13 NOTE — ED Notes (Signed)
Bed: ZO10 Expected date:  Expected time:  Means of arrival:  Comments: 61 yo M pulled peg tube out

## 2017-04-14 ENCOUNTER — Emergency Department (HOSPITAL_COMMUNITY): Payer: Medicare Other

## 2017-04-14 DIAGNOSIS — K9423 Gastrostomy malfunction: Secondary | ICD-10-CM | POA: Diagnosis not present

## 2017-04-14 MED ORDER — IOPAMIDOL (ISOVUE-300) INJECTION 61%
INTRAVENOUS | Status: AC
  Administered 2017-04-14: 30 mL via GASTROSTOMY
  Filled 2017-04-14: qty 30
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
## 2017-04-14 NOTE — ED Provider Notes (Signed)
   Dg Abdomen Peg Tube Location  Result Date: 04/14/2017 CLINICAL DATA:  Assess G-tube position.  Initial encounter. EXAM: ABDOMEN - 1 VIEW COMPARISON:  CT of the abdomen and pelvis from 02/05/2016 FINDINGS: Injection of contrast through the patient's G-tube demonstrates filling of the patient's duodenum and proximal jejunum. The visualized bowel gas pattern is grossly unremarkable. No acute osseous abnormalities are seen. Clips are noted within the right upper quadrant, reflecting prior cholecystectomy. IMPRESSION: Injection of contrast through the patient's G-tube demonstrates filling of the patient's duodenum and proximal jejunum, as expected. Electronically Signed   By: Roanna Raider M.D.   On: 04/14/2017 01:31    2:08 AM Care signed out to be by PA Maczis pending films after PEG tube replaced.  Films indicate in appropriate position. Patient has no other complaints.  Stable for discharge.    Garlon Hatchet, PA-C 04/14/17 Guerry Minors, MD 04/15/17 [phone]

## 2017-04-14 NOTE — Progress Notes (Signed)
Pt orally suctioned for a large amt of thick, frothy white secretions.  Pt tolerated well, RT to monitor and assess as needed.

## 2017-05-09 ENCOUNTER — Non-Acute Institutional Stay (SKILLED_NURSING_FACILITY): Payer: Medicare Other

## 2017-05-09 DIAGNOSIS — Z0000 Encounter for general adult medical examination without abnormal findings: Secondary | ICD-10-CM | POA: Diagnosis not present

## 2017-05-09 NOTE — Patient Instructions (Signed)
Alexander [name] , Thank you for taking time to come for your Medicare Wellness Visit. I appreciate your ongoing commitment to your health goals. Please review the following plan we discussed and let me know if I can assist you in the future.   Screening recommendations/referrals: Colonoscopy excluded, pt is long term Recommended yearly ophthalmology/optometry visit for glaucoma screening and checkup Recommended yearly dental visit for hygiene and checkup  Vaccinations: Influenza vaccine due, facility is in process of giving them Pneumococcal vaccine 13 due, ordered Tdap vaccine up to date. Due 04/28/2026 Shingles vaccine not in records    Advanced directives: Need a copy of living will and health care power of attorney for chart  Conditions/risks identified: None  Next appointment: Dr. Alwyn RenHopper makes rounds  Preventive Care 40-64 Years, Male Preventive care refers to lifestyle choices and visits with your health care provider that can promote health and wellness. What does preventive care include?  A yearly physical exam. This is also called an annual well check.  Dental exams once or twice a year.  Routine eye exams. Ask your health care provider how often you should have your eyes checked.  Personal lifestyle choices, including:  Daily care of your teeth and gums.  Regular physical activity.  Eating a healthy diet.  Avoiding tobacco and drug use.  Limiting alcohol use.  Practicing safe sex.  Taking low-dose aspirin every day starting at age 59. What happens during an annual well check? The services and screenings done by your health care provider during your annual well check will depend on your age, overall health, lifestyle risk factors, and family history of disease. Counseling  Your health care provider may ask you questions about your:  Alcohol use.  Tobacco use.  Drug use.  Emotional well-being.  Home and relationship well-being.  Sexual  activity.  Eating habits.  Work and work Astronomerenvironment. Screening  You may have the following tests or measurements:  Height, weight, and BMI.  Blood pressure.  Lipid and cholesterol levels. These may be checked every 5 years, or more frequently if you are over 59 years old.  Skin check.  Lung cancer screening. You may have this screening every year starting at age 59 if you have a 30-pack-year history of smoking and currently smoke or have quit within the past 15 years.  Fecal occult blood test (FOBT) of the stool. You may have this test every year starting at age 59.  Flexible sigmoidoscopy or colonoscopy. You may have a sigmoidoscopy every 5 years or a colonoscopy every 10 years starting at age 59.  Prostate cancer screening. Recommendations will vary depending on your family history and other risks.  Hepatitis C blood test.  Hepatitis B blood test.  Sexually transmitted disease (STD) testing.  Diabetes screening. This is done by checking your blood sugar (glucose) after you have not eaten for a while (fasting). You may have this done every 1-3 years. Discuss your test results, treatment options, and if necessary, the need for more tests with your health care provider. Vaccines  Your health care provider may recommend certain vaccines, such as:  Influenza vaccine. This is recommended every year.  Tetanus, diphtheria, and acellular pertussis (Tdap, Td) vaccine. You may need a Td booster every 10 years.  Zoster vaccine. You may need this after age 59.  Pneumococcal 13-valent conjugate (PCV13) vaccine. You may need this if you have certain conditions and have not been vaccinated.  Pneumococcal polysaccharide (PPSV23) vaccine. You may need one or two doses  if you smoke cigarettes or if you have certain conditions. Talk to your health care provider about which screenings and vaccines you need and how often you need them. This information is not intended to replace advice  given to you by your health care provider. Make sure you discuss any questions you have with your health care provider. Document Released: 07/16/2015 Document Revised: 03/08/2016 Document Reviewed: 04/20/2015 Elsevier Interactive Patient Education  2017 [place] Prevention in the Home Falls can cause injuries. They can happen to people of all ages. There are many things you can do to make your home safe and to help prevent falls. What can I do on the outside of my home?  Regularly fix the edges of walkways and driveways and fix any cracks.  Remove anything that might make you trip as you walk through a door, such as a raised step or threshold.  Trim any bushes or trees on the path to your home.  Use bright outdoor lighting.  Clear any walking paths of anything that might make someone trip, such as rocks or tools.  Regularly check to see if handrails are loose or broken. Make sure that both sides of any steps have handrails.  Any raised decks and porches should have guardrails on the edges.  Have any leaves, snow, or ice cleared regularly.  Use sand or salt on walking paths during winter.  Clean up any spills in your garage right away. This includes oil or grease spills. What can I do in the bathroom?  Use night lights.  Install grab bars by the toilet and in the tub and shower. Do not use towel bars as grab bars.  Use non-skid mats or decals in the tub or shower.  If you need to sit down in the shower, use a plastic, non-slip stool.  Keep the floor dry. Clean up any water that spills on the floor as soon as it happens.  Remove soap buildup in the tub or shower regularly.  Attach bath mats securely with double-sided non-slip rug tape.  Do not have throw rugs and other things on the floor that can make you trip. What can I do in the bedroom?  Use night lights.  Make sure that you have a light by your bed that is easy to reach.  Do not use any sheets or  blankets that are too big for your bed. They should not hang down onto the floor.  Have a firm chair that has side arms. You can use this for support while you get dressed.  Do not have throw rugs and other things on the floor that can make you trip. What can I do in the kitchen?  Clean up any spills right away.  Avoid walking on wet floors.  Keep items that you use a lot in easy-to-reach places.  If you need to reach something above you, use a strong step stool that has a grab bar.  Keep electrical cords out of the way.  Do not use floor polish or wax that makes floors slippery. If you must use wax, use non-skid floor wax.  Do not have throw rugs and other things on the floor that can make you trip. What can I do with my stairs?  Do not leave any items on the stairs.  Make sure that there are handrails on both sides of the stairs and use them. Fix handrails that are broken or loose. Make sure that handrails are as long  as the stairways.  Check any carpeting to make sure that it is firmly attached to the stairs. Fix any carpet that is loose or worn.  Avoid having throw rugs at the top or bottom of the stairs. If you do have throw rugs, attach them to the floor with carpet tape.  Make sure that you have a light switch at the top of the stairs and the bottom of the stairs. If you do not have them, ask someone to add them for you. What else can I do to help prevent falls?  Wear shoes that:  Do not have high heels.  Have rubber bottoms.  Are comfortable and fit you well.  Are closed at the toe. Do not wear sandals.  If you use a stepladder:  Make sure that it is fully opened. Do not climb a closed stepladder.  Make sure that both sides of the stepladder are locked into place.  Ask someone to hold it for you, if possible.  Clearly mark and make sure that you can see:  Any grab bars or handrails.  First and last steps.  Where the edge of each step is.  Use tools  that help you move around (mobility aids) if they are needed. These include:  Canes.  Walkers.  Scooters.  Crutches.  Turn on the lights when you go into a dark area. Replace any light bulbs as soon as they burn out.  Set up your furniture so you have a clear path. Avoid moving your furniture around.  If any of your floors are uneven, fix them.  If there are any pets around you, be aware of where they are.  Review your medicines with your doctor. Some medicines can make you feel dizzy. This can increase your chance of falling. Ask your doctor what other things that you can do to help prevent falls. This information is not intended to replace advice given to you by your health care provider. Make sure you discuss any questions you have with your health care provider. Document Released: 04/15/2009 Document Revised: 11/25/2015 Document Reviewed: 07/24/2014 Elsevier Interactive Patient Education  2017 [employer]
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
## 2017-05-19 ENCOUNTER — Emergency Department (HOSPITAL_COMMUNITY): Payer: Medicare Other

## 2017-05-19 ENCOUNTER — Other Ambulatory Visit: Payer: Self-pay

## 2017-05-19 ENCOUNTER — Inpatient Hospital Stay (HOSPITAL_COMMUNITY): Payer: Medicare Other

## 2017-05-19 ENCOUNTER — Inpatient Hospital Stay (HOSPITAL_COMMUNITY)
Admission: EM | Admit: 2017-05-19 | Discharge: 2017-05-27 | DRG: 698 | Disposition: A | Discharge status: Skilled Nursing Facility | Payer: Medicare Other | Attending: Internal Medicine | Admitting: Internal Medicine

## 2017-05-19 ENCOUNTER — Encounter (HOSPITAL_COMMUNITY): Payer: Self-pay | Admitting: *Deleted

## 2017-05-19 DIAGNOSIS — N179 Acute kidney failure, unspecified: Secondary | ICD-10-CM | POA: Diagnosis present

## 2017-05-19 DIAGNOSIS — K567 Ileus, unspecified: Secondary | ICD-10-CM

## 2017-05-19 DIAGNOSIS — G8929 Other chronic pain: Secondary | ICD-10-CM | POA: Diagnosis present

## 2017-05-19 DIAGNOSIS — Z87820 Personal history of traumatic brain injury: Secondary | ICD-10-CM

## 2017-05-19 DIAGNOSIS — Z9289 Personal history of other medical treatment: Secondary | ICD-10-CM | POA: Diagnosis not present

## 2017-05-19 DIAGNOSIS — R319 Hematuria, unspecified: Secondary | ICD-10-CM

## 2017-05-19 DIAGNOSIS — E1151 Type 2 diabetes mellitus with diabetic peripheral angiopathy without gangrene: Secondary | ICD-10-CM | POA: Diagnosis present

## 2017-05-19 DIAGNOSIS — G40909 Epilepsy, unspecified, not intractable, without status epilepticus: Secondary | ICD-10-CM | POA: Diagnosis present

## 2017-05-19 DIAGNOSIS — K219 Gastro-esophageal reflux disease without esophagitis: Secondary | ICD-10-CM | POA: Diagnosis present

## 2017-05-19 DIAGNOSIS — Z8673 Personal history of transient ischemic attack (TIA), and cerebral infarction without residual deficits: Secondary | ICD-10-CM

## 2017-05-19 DIAGNOSIS — E7800 Pure hypercholesterolemia, unspecified: Secondary | ICD-10-CM | POA: Diagnosis present

## 2017-05-19 DIAGNOSIS — M1990 Unspecified osteoarthritis, unspecified site: Secondary | ICD-10-CM | POA: Diagnosis present

## 2017-05-19 DIAGNOSIS — J80 Acute respiratory distress syndrome: Secondary | ICD-10-CM | POA: Diagnosis present

## 2017-05-19 DIAGNOSIS — R1310 Dysphagia, unspecified: Secondary | ICD-10-CM | POA: Diagnosis present

## 2017-05-19 DIAGNOSIS — D638 Anemia in other chronic diseases classified elsewhere: Secondary | ICD-10-CM | POA: Diagnosis present

## 2017-05-19 DIAGNOSIS — A4159 Other Gram-negative sepsis: Secondary | ICD-10-CM | POA: Diagnosis present

## 2017-05-19 DIAGNOSIS — G231 Progressive supranuclear ophthalmoplegia [Steele-Richardson-Olszewski]: Secondary | ICD-10-CM | POA: Diagnosis present

## 2017-05-19 DIAGNOSIS — Z931 Gastrostomy status: Secondary | ICD-10-CM | POA: Diagnosis not present

## 2017-05-19 DIAGNOSIS — Z87891 Personal history of nicotine dependence: Secondary | ICD-10-CM

## 2017-05-19 DIAGNOSIS — D6959 Other secondary thrombocytopenia: Secondary | ICD-10-CM | POA: Diagnosis present

## 2017-05-19 DIAGNOSIS — L89152 Pressure ulcer of sacral region, stage 2: Secondary | ICD-10-CM | POA: Diagnosis present

## 2017-05-19 DIAGNOSIS — T83518A Infection and inflammatory reaction due to other urinary catheter, initial encounter: Principal | ICD-10-CM | POA: Diagnosis present

## 2017-05-19 DIAGNOSIS — R6521 Severe sepsis with septic shock: Secondary | ICD-10-CM | POA: Diagnosis present

## 2017-05-19 DIAGNOSIS — E876 Hypokalemia: Secondary | ICD-10-CM | POA: Diagnosis not present

## 2017-05-19 DIAGNOSIS — Z947 Corneal transplant status: Secondary | ICD-10-CM

## 2017-05-19 DIAGNOSIS — J811 Chronic pulmonary edema: Secondary | ICD-10-CM | POA: Diagnosis present

## 2017-05-19 DIAGNOSIS — N319 Neuromuscular dysfunction of bladder, unspecified: Secondary | ICD-10-CM | POA: Diagnosis present

## 2017-05-19 DIAGNOSIS — N400 Enlarged prostate without lower urinary tract symptoms: Secondary | ICD-10-CM | POA: Diagnosis present

## 2017-05-19 DIAGNOSIS — F419 Anxiety disorder, unspecified: Secondary | ICD-10-CM | POA: Diagnosis present

## 2017-05-19 DIAGNOSIS — Z8701 Personal history of pneumonia (recurrent): Secondary | ICD-10-CM

## 2017-05-19 DIAGNOSIS — I493 Ventricular premature depolarization: Secondary | ICD-10-CM | POA: Diagnosis present

## 2017-05-19 DIAGNOSIS — J810 Acute pulmonary edema: Secondary | ICD-10-CM | POA: Diagnosis present

## 2017-05-19 DIAGNOSIS — J9601 Acute respiratory failure with hypoxia: Secondary | ICD-10-CM

## 2017-05-19 DIAGNOSIS — Z9049 Acquired absence of other specified parts of digestive tract: Secondary | ICD-10-CM

## 2017-05-19 DIAGNOSIS — N390 Urinary tract infection, site not specified: Secondary | ICD-10-CM

## 2017-05-19 DIAGNOSIS — R4182 Altered mental status, unspecified: Secondary | ICD-10-CM | POA: Diagnosis not present

## 2017-05-19 DIAGNOSIS — Z960 Presence of urogenital implants: Secondary | ICD-10-CM

## 2017-05-19 DIAGNOSIS — R7881 Bacteremia: Secondary | ICD-10-CM

## 2017-05-19 DIAGNOSIS — R509 Fever, unspecified: Secondary | ICD-10-CM | POA: Diagnosis present

## 2017-05-19 DIAGNOSIS — A419 Sepsis, unspecified organism: Secondary | ICD-10-CM | POA: Diagnosis not present

## 2017-05-19 DIAGNOSIS — G92 Toxic encephalopathy: Secondary | ICD-10-CM | POA: Diagnosis present

## 2017-05-19 DIAGNOSIS — Y846 Urinary catheterization as the cause of abnormal reaction of the patient, or of later complication, without mention of misadventure at the time of the procedure: Secondary | ICD-10-CM | POA: Diagnosis present

## 2017-05-19 DIAGNOSIS — E43 Unspecified severe protein-calorie malnutrition: Secondary | ICD-10-CM | POA: Diagnosis present

## 2017-05-19 DIAGNOSIS — J9690 Respiratory failure, unspecified, unspecified whether with hypoxia or hypercapnia: Secondary | ICD-10-CM

## 2017-05-19 DIAGNOSIS — Z880 Allergy status to penicillin: Secondary | ICD-10-CM

## 2017-05-19 DIAGNOSIS — Z915 Personal history of self-harm: Secondary | ICD-10-CM

## 2017-05-19 DIAGNOSIS — Z87440 Personal history of urinary (tract) infections: Secondary | ICD-10-CM

## 2017-05-19 DIAGNOSIS — M109 Gout, unspecified: Secondary | ICD-10-CM | POA: Diagnosis present

## 2017-05-19 DIAGNOSIS — Z978 Presence of other specified devices: Secondary | ICD-10-CM

## 2017-05-19 DIAGNOSIS — E1142 Type 2 diabetes mellitus with diabetic polyneuropathy: Secondary | ICD-10-CM | POA: Diagnosis present

## 2017-05-19 DIAGNOSIS — Z6822 Body mass index (BMI) 22.0-22.9, adult: Secondary | ICD-10-CM

## 2017-05-19 DIAGNOSIS — I10 Essential (primary) hypertension: Secondary | ICD-10-CM | POA: Diagnosis present

## 2017-05-19 DIAGNOSIS — Z79899 Other long term (current) drug therapy: Secondary | ICD-10-CM

## 2017-05-19 DIAGNOSIS — H18609 Keratoconus, unspecified, unspecified eye: Secondary | ICD-10-CM | POA: Diagnosis present

## 2017-05-19 DIAGNOSIS — F329 Major depressive disorder, single episode, unspecified: Secondary | ICD-10-CM | POA: Diagnosis present

## 2017-05-19 DIAGNOSIS — Z7951 Long term (current) use of inhaled steroids: Secondary | ICD-10-CM

## 2017-05-19 LAB — BLOOD GAS, ARTERIAL
ACID-BASE DEFICIT: 3.1 mmol/L — AB (ref 0.0–2.0)
Bicarbonate: 20.9 mmol/L (ref 20.0–28.0)
DRAWN BY: 244851
FIO2: 100
LHR: 22 {breaths}/min
O2 Saturation: 99.4 %
PATIENT TEMPERATURE: 98.6
PCO2 ART: 33.9 mmHg (ref 32.0–48.0)
PEEP: 5 cmH2O
PH ART: 7.406 (ref 7.350–7.450)
PO2 ART: 202 mmHg — AB (ref 83.0–108.0)
VT: 640 mL

## 2017-05-19 LAB — I-STAT CG4 LACTIC ACID, ED
LACTIC ACID, VENOUS: 1.66 mmol/L (ref 0.5–1.9)
Lactic Acid, Venous: 1 mmol/L (ref 0.5–1.9)

## 2017-05-19 LAB — URINALYSIS, ROUTINE W REFLEX MICROSCOPIC
BILIRUBIN URINE: NEGATIVE
GLUCOSE, UA: NEGATIVE mg/dL
Ketones, ur: NEGATIVE mg/dL
Nitrite: NEGATIVE
PH: 7 (ref 5.0–8.0)
Protein, ur: >=300 mg/dL — AB
SQUAMOUS EPITHELIAL / LPF: NONE SEEN
Specific Gravity, Urine: 1.018 (ref 1.005–1.030)

## 2017-05-19 LAB — CBC WITH DIFFERENTIAL/PLATELET
BASOS ABS: 0 10*3/uL (ref 0.0–0.1)
Basophils Relative: 0 %
Eosinophils Absolute: 0 10*3/uL (ref 0.0–0.7)
Eosinophils Relative: 0 %
HEMATOCRIT: 37.4 % — AB (ref 39.0–52.0)
Hemoglobin: 12.3 g/dL — ABNORMAL LOW (ref 13.0–17.0)
LYMPHS ABS: 0.5 10*3/uL — AB (ref 0.7–4.0)
Lymphocytes Relative: 4 %
MCH: 32.5 pg (ref 26.0–34.0)
MCHC: 32.9 g/dL (ref 30.0–36.0)
MCV: 98.7 fL (ref 78.0–100.0)
MONOS PCT: 6 %
Monocytes Absolute: 0.8 10*3/uL (ref 0.1–1.0)
NEUTROS ABS: 12.4 10*3/uL — AB (ref 1.7–7.7)
Neutrophils Relative %: 90 %
Platelets: 102 10*3/uL — ABNORMAL LOW (ref 150–400)
RBC: 3.79 MIL/uL — ABNORMAL LOW (ref 4.22–5.81)
RDW: 14.8 % (ref 11.5–15.5)
WBC: 13.7 10*3/uL — ABNORMAL HIGH (ref 4.0–10.5)

## 2017-05-19 LAB — GLUCOSE, CAPILLARY
Glucose-Capillary: 99 mg/dL (ref 65–99)
Glucose-Capillary: 75 mg/dL (ref 65–99)
Glucose-Capillary: 106 mg/dL — ABNORMAL HIGH (ref 65–99)

## 2017-05-19 LAB — COMPREHENSIVE METABOLIC PANEL
ALK PHOS: 105 U/L (ref 38–126)
ALT: 32 U/L (ref 17–63)
AST: 33 U/L (ref 15–41)
Albumin: 3.3 g/dL — ABNORMAL LOW (ref 3.5–5.0)
Anion gap: 9 (ref 5–15)
BILIRUBIN TOTAL: 0.4 mg/dL (ref 0.3–1.2)
BUN: 44 mg/dL — AB (ref 6–20)
CHLORIDE: 99 mmol/L — AB (ref 101–111)
CO2: 25 mmol/L (ref 22–32)
CREATININE: 1.21 mg/dL (ref 0.61–1.24)
Calcium: 9.2 mg/dL (ref 8.9–10.3)
GFR calc Af Amer: >60 mL/min (ref >60)
Glucose, Bld: 128 mg/dL — ABNORMAL HIGH (ref 65–99)
Potassium: 4.3 mmol/L (ref 3.5–5.1)
Sodium: 133 mmol/L — ABNORMAL LOW (ref 135–145)
Total Protein: 6.8 g/dL (ref 6.5–8.1)

## 2017-05-19 LAB — I-STAT ARTERIAL BLOOD GAS, ED
Acid-base deficit: 2 mmol/L (ref 0.0–2.0)
Bicarbonate: 24.2 mmol/L (ref 20.0–28.0)
O2 Saturation: 94 %
PCO2 ART: 55.3 mmHg — AB (ref 32.0–48.0)
PO2 ART: 98 mmHg (ref 83.0–108.0)
Patient temperature: 104.7
TCO2: 26 mmol/L (ref 22–32)
pH, Arterial: 7.266 — ABNORMAL LOW (ref 7.350–7.450)

## 2017-05-19 LAB — LACTIC ACID, PLASMA
LACTIC ACID, VENOUS: 2.9 mmol/L — AB (ref 0.5–1.9)
LACTIC ACID, VENOUS: 2.7 mmol/L — AB (ref 0.5–1.9)

## 2017-05-19 LAB — BRAIN NATRIURETIC PEPTIDE: B NATRIURETIC PEPTIDE 5: 204.3 pg/mL — AB (ref 0.0–100.0)

## 2017-05-19 LAB — TROPONIN I
TROPONIN I: 0.06 ng/mL — AB (ref <0.03)
Troponin I: <0.03 ng/mL (ref <0.03)

## 2017-05-19 LAB — PROTIME-INR
INR: 1.21
Prothrombin Time: 15.2 seconds (ref 11.4–15.2)

## 2017-05-19 LAB — STREP PNEUMONIAE URINARY ANTIGEN: STREP PNEUMO URINARY ANTIGEN: NEGATIVE

## 2017-05-19 LAB — LIPASE, BLOOD: LIPASE: 15 U/L (ref 11–51)

## 2017-05-19 LAB — CORTISOL: CORTISOL PLASMA: 17.6 ug/dL

## 2017-05-19 LAB — PROCALCITONIN: PROCALCITONIN: 0.52 ng/mL

## 2017-05-19 LAB — MRSA PCR SCREENING: MRSA BY PCR: POSITIVE — AB

## 2017-05-19 MED ORDER — LEVETIRACETAM 100 MG/ML PO SOLN
250.0000 mg | Freq: Two times a day (BID) | ORAL | Status: DC
  Administered 2017-05-19 – 2017-05-27 (×16): 250 mg
  Filled 2017-05-19 (×3): qty 2.5
  Filled 2017-05-19: qty 5
  Filled 2017-05-19: qty 2.5
  Filled 2017-05-19: qty 5
  Filled 2017-05-19: qty 2.5
  Filled 2017-05-19 (×2): qty 5
  Filled 2017-05-19 (×8): qty 2.5

## 2017-05-19 MED ORDER — LACOSAMIDE 10 MG/ML PO SOLN
100.0000 mg | Freq: Two times a day (BID) | ORAL | Status: DC
  Administered 2017-05-19 – 2017-05-20 (×2): 100 mg via ORAL
  Filled 2017-05-19: qty 10
  Filled 2017-05-19 (×3): qty 12
  Filled 2017-05-19: qty 10

## 2017-05-19 MED ORDER — ASPIRIN 81 MG PO CHEW: 324.0000 mg | CHEWABLE_TABLET | ORAL | Status: DC

## 2017-05-19 MED ORDER — NOREPINEPHRINE BITARTRATE 1 MG/ML IV SOLN
5.0000 ug/min | INTRAVENOUS | Status: DC
  Administered 2017-05-19: 10 ug/min via INTRAVENOUS

## 2017-05-19 MED ORDER — MIDAZOLAM HCL 2 MG/2ML IJ SOLN
INTRAMUSCULAR | Status: AC
  Administered 2017-05-19: 2 mg
  Filled 2017-05-19: qty 2

## 2017-05-19 MED ORDER — SODIUM CHLORIDE 0.9 % IV SOLN
1.0000 g | Freq: Three times a day (TID) | INTRAVENOUS | Status: DC
  Administered 2017-05-19 – 2017-05-22 (×10): 1 g via INTRAVENOUS
  Filled 2017-05-19 (×11): qty 1

## 2017-05-19 MED ORDER — FENTANYL CITRATE (PF) 100 MCG/2ML IJ SOLN
INTRAMUSCULAR | Status: AC | PRN
  Administered 2017-05-19: 100 ug via INTRAVENOUS

## 2017-05-19 MED ORDER — MIDAZOLAM HCL 2 MG/2ML IJ SOLN
INTRAMUSCULAR | Status: AC
  Filled 2017-05-19: qty 2

## 2017-05-19 MED ORDER — FENTANYL CITRATE (PF) 100 MCG/2ML IJ SOLN
INTRAMUSCULAR | Status: AC
  Filled 2017-05-19: qty 2

## 2017-05-19 MED ORDER — MIDAZOLAM HCL 2 MG/2ML IJ SOLN
1.0000 mg | INTRAMUSCULAR | Status: DC | PRN
  Administered 2017-05-19 – 2017-05-23 (×4): 2 mg via INTRAVENOUS
  Filled 2017-05-19 (×4): qty 2

## 2017-05-19 MED ORDER — FAMOTIDINE IN NACL 20-0.9 MG/50ML-% IV SOLN: 20.0000 mg | Freq: Two times a day (BID) | INTRAVENOUS | Status: DC

## 2017-05-19 MED ORDER — VANCOMYCIN HCL IN DEXTROSE 1-5 GM/200ML-% IV SOLN
1000.0000 mg | Freq: Two times a day (BID) | INTRAVENOUS | Status: DC
  Administered 2017-05-19 – 2017-05-21 (×4): 1000 mg via INTRAVENOUS
  Filled 2017-05-19 (×5): qty 200

## 2017-05-19 MED ORDER — SODIUM CHLORIDE 0.9 % IV BOLUS (SEPSIS)
500.0000 mL | Freq: Once | INTRAVENOUS | Status: AC
  Administered 2017-05-19: 500 mL via INTRAVENOUS

## 2017-05-19 MED ORDER — DEXMEDETOMIDINE HCL IN NACL 400 MCG/100ML IV SOLN
0.4000 ug/kg/h | INTRAVENOUS | Status: DC
  Administered 2017-05-20 (×2): 0.8 ug/kg/h via INTRAVENOUS
  Administered 2017-05-20 – 2017-05-21 (×3): 0.7 ug/kg/h via INTRAVENOUS
  Administered 2017-05-21: 0.8 ug/kg/h via INTRAVENOUS
  Administered 2017-05-21: 0.7 ug/kg/h via INTRAVENOUS
  Administered 2017-05-21: 0.3 ug/kg/h via INTRAVENOUS
  Administered 2017-05-22: 0.7 ug/kg/h via INTRAVENOUS
  Administered 2017-05-22 (×2): 0.6 ug/kg/h via INTRAVENOUS
  Administered 2017-05-23: 0.4 ug/kg/h via INTRAVENOUS
  Filled 2017-05-19 (×11): qty 100

## 2017-05-19 MED ORDER — FENTANYL BOLUS VIA INFUSION
50.0000 ug | INTRAVENOUS | Status: DC | PRN
  Administered 2017-05-19 – 2017-05-20 (×3): 50 ug via INTRAVENOUS
  Filled 2017-05-19: qty 50

## 2017-05-19 MED ORDER — DEXTROSE 5 % IV SOLN: 2.0000 g | Freq: Once | INTRAVENOUS | Status: DC

## 2017-05-19 MED ORDER — ORAL CARE MOUTH RINSE
15.0000 mL | Freq: Four times a day (QID) | OROMUCOSAL | Status: DC
  Administered 2017-05-19 – 2017-05-23 (×18): 15 mL via OROMUCOSAL

## 2017-05-19 MED ORDER — MIDAZOLAM HCL 2 MG/2ML IJ SOLN
1.0000 mg | INTRAMUSCULAR | Status: DC | PRN
  Filled 2017-05-19 (×2): qty 2

## 2017-05-19 MED ORDER — MIDAZOLAM HCL 2 MG/2ML IJ SOLN: 1.0000 mg | INTRAMUSCULAR | Status: DC | PRN

## 2017-05-19 MED ORDER — DOCUSATE SODIUM 50 MG/5ML PO LIQD: 100.0000 mg | Freq: Two times a day (BID) | ORAL | Status: DC | PRN

## 2017-05-19 MED ORDER — ACETAMINOPHEN 650 MG RE SUPP
650.0000 mg | Freq: Once | RECTAL | Status: AC
  Administered 2017-05-19: 650 mg via RECTAL

## 2017-05-19 MED ORDER — FENTANYL CITRATE (PF) 100 MCG/2ML IJ SOLN: 50.0000 ug | Freq: Once | INTRAMUSCULAR | Status: DC

## 2017-05-19 MED ORDER — ASPIRIN 300 MG RE SUPP: 300.0000 mg | RECTAL | Status: DC

## 2017-05-19 MED ORDER — ONDANSETRON HCL 4 MG/2ML IJ SOLN: 4.0000 mg | Freq: Four times a day (QID) | INTRAMUSCULAR | Status: DC | PRN

## 2017-05-19 MED ORDER — TOPIRAMATE 100 MG PO TABS
100.0000 mg | ORAL_TABLET | Freq: Two times a day (BID) | ORAL | Status: DC
  Administered 2017-05-19 – 2017-05-27 (×16): 100 mg
  Filled 2017-05-19 (×18): qty 1

## 2017-05-19 MED ORDER — ACETAMINOPHEN 160 MG/5ML PO SOLN
650.0000 mg | ORAL | Status: DC | PRN
  Administered 2017-05-19 – 2017-05-25 (×6): 650 mg
  Filled 2017-05-19 (×6): qty 20.3

## 2017-05-19 MED ORDER — LEVOFLOXACIN IN D5W 750 MG/150ML IV SOLN
750.0000 mg | Freq: Once | INTRAVENOUS | Status: AC
  Administered 2017-05-19: 750 mg via INTRAVENOUS
  Filled 2017-05-19: qty 150

## 2017-05-19 MED ORDER — DEXTROSE-NACL 5-0.9 % IV SOLN
INTRAVENOUS | Status: DC
  Administered 2017-05-19 – 2017-05-20 (×2): via INTRAVENOUS

## 2017-05-19 MED ORDER — SODIUM CHLORIDE 0.9 % IV SOLN
250.0000 mL | INTRAVENOUS | Status: DC | PRN
  Administered 2017-05-19 – 2017-05-21 (×2): 250 mL via INTRAVENOUS

## 2017-05-19 MED ORDER — ETOMIDATE 2 MG/ML IV SOLN
INTRAVENOUS | Status: AC | PRN
  Administered 2017-05-19: 30 mg via INTRAVENOUS

## 2017-05-19 MED ORDER — INSULIN ASPART 100 UNIT/ML ~~LOC~~ SOLN
2.0000 [IU] | SUBCUTANEOUS | Status: DC
  Administered 2017-05-20 – 2017-05-21 (×6): 2 [IU] via SUBCUTANEOUS

## 2017-05-19 MED ORDER — VANCOMYCIN HCL IN DEXTROSE 1-5 GM/200ML-% IV SOLN: 1000.0000 mg | Freq: Once | INTRAVENOUS | Status: DC

## 2017-05-19 MED ORDER — HEPARIN SODIUM (PORCINE) 5000 UNIT/ML IJ SOLN
5000.0000 [IU] | Freq: Three times a day (TID) | INTRAMUSCULAR | Status: DC
  Administered 2017-05-19 – 2017-05-27 (×25): 5000 [IU] via SUBCUTANEOUS
  Filled 2017-05-19 (×27): qty 1

## 2017-05-19 MED ORDER — ALBUTEROL SULFATE (2.5 MG/3ML) 0.083% IN NEBU: 2.5000 mg | INHALATION_SOLUTION | RESPIRATORY_TRACT | Status: DC | PRN

## 2017-05-19 MED ORDER — MIDAZOLAM HCL 2 MG/2ML IJ SOLN
2.0000 mg | Freq: Once | INTRAMUSCULAR | Status: AC
  Administered 2017-05-19: 2 mg via INTRAVENOUS

## 2017-05-19 MED ORDER — PHENYLEPHRINE HCL 10 MG/ML IJ SOLN
INTRAMUSCULAR | Status: AC | PRN
  Administered 2017-05-19: 160 ug
  Administered 2017-05-19: 120 ug

## 2017-05-19 MED ORDER — SODIUM CHLORIDE 0.9 % IV BOLUS (SEPSIS)
1000.0000 mL | Freq: Once | INTRAVENOUS | Status: AC
  Administered 2017-05-19: 1000 mL via INTRAVENOUS

## 2017-05-19 MED ORDER — FAMOTIDINE IN NACL 20-0.9 MG/50ML-% IV SOLN
20.0000 mg | Freq: Two times a day (BID) | INTRAVENOUS | Status: DC
  Administered 2017-05-19 – 2017-05-20 (×3): 20 mg via INTRAVENOUS
  Filled 2017-05-19 (×5): qty 50

## 2017-05-19 MED ORDER — SODIUM CHLORIDE 0.9 % IV SOLN
0.4000 ug/kg/h | INTRAVENOUS | Status: DC
  Administered 2017-05-19: 0.8 ug/kg/h via INTRAVENOUS
  Administered 2017-05-19: 0.4 ug/kg/h via INTRAVENOUS
  Administered 2017-05-19: 0.6 ug/kg/h via INTRAVENOUS
  Filled 2017-05-19 (×3): qty 2

## 2017-05-19 MED ORDER — FENTANYL 2500MCG IN NS 250ML (10MCG/ML) PREMIX INFUSION
25.0000 ug/h | INTRAVENOUS | Status: DC
  Administered 2017-05-19: 200 ug/h via INTRAVENOUS
  Administered 2017-05-19: 300 ug/h via INTRAVENOUS
  Administered 2017-05-19: 50 ug/h via INTRAVENOUS
  Administered 2017-05-20: 300 ug/h via INTRAVENOUS
  Administered 2017-05-20: 315 ug/h via INTRAVENOUS
  Administered 2017-05-21: 300 ug/h via INTRAVENOUS
  Filled 2017-05-19 (×6): qty 250

## 2017-05-19 MED ORDER — PHENYLEPHRINE 40 MCG/ML (10ML) SYRINGE FOR IV PUSH (FOR BLOOD PRESSURE SUPPORT)
PREFILLED_SYRINGE | INTRAVENOUS | Status: AC
  Filled 2017-05-19: qty 10

## 2017-05-19 MED ORDER — MIDAZOLAM HCL 2 MG/2ML IJ SOLN
INTRAMUSCULAR | Status: AC | PRN
  Administered 2017-05-19 (×2): 2 mg via INTRAVENOUS

## 2017-05-19 MED ORDER — ACETAMINOPHEN 325 MG PO TABS: 650.0000 mg | ORAL_TABLET | ORAL | Status: DC | PRN

## 2017-05-19 MED ORDER — CHLORHEXIDINE GLUCONATE 0.12% ORAL RINSE (MEDLINE KIT)
15.0000 mL | Freq: Two times a day (BID) | OROMUCOSAL | Status: DC
  Administered 2017-05-19 – 2017-05-23 (×10): 15 mL via OROMUCOSAL

## 2017-05-19 MED ORDER — VANCOMYCIN HCL 10 G IV SOLR
1500.0000 mg | Freq: Once | INTRAVENOUS | Status: AC
  Administered 2017-05-19: 1500 mg via INTRAVENOUS
  Filled 2017-05-19: qty 1500

## 2017-05-19 MED ORDER — SUCCINYLCHOLINE CHLORIDE 20 MG/ML IJ SOLN
INTRAMUSCULAR | Status: AC | PRN
  Administered 2017-05-19: 100 mg via INTRAVENOUS

## 2017-05-19 NOTE — Progress Notes (Signed)
Pharmacy Antibiotic Note  Alexander FreudMartin A [name] is a 61 y.o. male admitted on 05/19/2017 with pneumonia and UTI.  Pharmacy has been consulted for vancomycin and meropenem dosing. Tmax is 104.7 and WBC is elevated at 13.7. SCr is 1.21. Lactic acid is <2. First dose of vancomycin given per ED provider.   Plan: Vancomycin 1gm IV Q12H Meropenem 1gm IV Q8H F/u renal fxn, C&S, clinical status and trough at SS  Height: 6\' 1"  (185.4 cm) Weight: 167 lb (75.8 kg) IBW/kg (Calculated) : 79.9  Temp (24hrs), Avg:102.4 F (39.1 C), Min:100 F (37.8 C), Max:104.7 F (40.4 C)  Recent Labs  Lab 05/19/17 0724 05/19/17 0727 05/19/17 1032  WBC 13.7*  --   --   CREATININE 1.21  --   --   LATICACIDVEN  --  1.66 1.00    Estimated Creatinine Clearance: 70.5 mL/min (by C-G formula based on SCr of 1.21 mg/dL).    Allergies  Allergen Reactions  . Amoxicillin Itching, Rash and Other (See Comments)    NOTED ON MAR Has patient had a PCN reaction causing immediate rash, facial/tongue/throat swelling, SOB or lightheadedness with hypotension: Unknown Has patient had a PCN reaction causing severe rash involving mucus membranes or skin necrosis: Unknown Has patient had a PCN reaction that required hospitalization Unknown Has patient had a PCN reaction occurring within the last 10 years: Unknown If all of the above answers are "NO", then may proceed with Cephalosporin use.     Antimicrobials this admission: Vanc 11/17>> Meropenem 11/17>> Levaquin x 1 11/17  Dose adjustments this admission: N/A  Microbiology results: Pending  Thank you for allowing pharmacy to be a part of this patient's care.  Ardath Lepak, Drake LeachRachel Lynn 05/19/2017 11:57 AM

## 2017-05-19 NOTE — ED Notes (Signed)
3 cc phenyl fgive

## 2017-05-19 NOTE — ED Notes (Signed)
Dr.Ray aware of suspected sepsis

## 2017-05-19 NOTE — Progress Notes (Signed)
Lactic acid 2.9 per lab, MD notified at bedside.

## 2017-05-19 NOTE — ED Notes (Signed)
Ccm paged RE sedation

## 2017-05-19 NOTE — ED Notes (Signed)
Admit MD at bedside

## 2017-05-19 NOTE — H&P (Signed)
PULMONARY / CRITICAL CARE MEDICINE   Name: Alexander [name] MRN: [id number] DOB: June 07, 1956    ADMISSION DATE:  05/19/2017 CONSULTATION DATE: 05/19/17  REFERRING MD: Dr Rosalia Hammers (ER)  CHIEF COMPLAINT:  Septic shock  HISTORY OF PRESENT ILLNESS:   61yoM with h/o TBI and C1 cervical fracture, CVA, DM, HTN, PVD, Seizures, Multiple aspiration pneumonia, Chronic foley with multiple UTI's, Depression with multiple prior suicide attempts, Nonverbal baseline, presents today from his nursing home with fever (T:104), Hypotension, Tachycardia, and reported AMS (unclear baseline), who was found in the ER to be in septic shock due to UTI. Patient fluid resuscitated with improvement in BP from 82/54 to 107/61 and improvement in mental status (alert and moving right hand on my exam, giving thumbs up to communicate). However after the IVF's patient developed respiratory distress with loud rhonchi and increased work of breathing, acute hypoxia and hypercapnea. Was on 100% NRB at time of my exam with pulmonary edema pouring out of nose and mouth. No family was present at time of my exam and although attempted to call contacts listed in chart, there were no answers on any numbers. Decision made to emergently intubate. Patient gave a "thumbs up" to this plan. Just prior to intubation patient was touching his abdomen with his right hand, in an area over his PEG tube. He gave a "thumbs up" when asked if it hurt. PEG tube aspirated with approx 500cc bilious material returned. Patient stopped grabbing his stomach after that. During intubation, purulent material seen between the vocal cords. Sputum culture yielded white creamy secretions consistent with pneumonia but not the same color as the gastric secretions aspirated. Patient tolerated intubation well (see separate procedure note). His blood pressure began dropping even prior to intubation when IVF's were paused. Following intubation, resumed IVF's and started Levophed.   PAST  MEDICAL HISTORY :  He  has a past medical history of Arthritis, Aspiration pneumonia (HCC), C1 cervical fracture (HCC) (10/09/2012), Cervical myelopathy (HCC) (10/09/2012), CNS degenerative disease, CVA (cerebral infarction), Depression (10/09/2012), Diabetes mellitus, Gout, HCAP (healthcare-associated pneumonia), Headache(784.0), Hypercholesterolemia, Hypertension, Keratoconus, Low back pain radiating to left leg, Muscle weakness, Peripheral neuropathy, Progressive supranuclear ophthalmoplegia (HCC), Seizure (HCC), Suicide attempt (HCC), Supranuclear palsy (HCC), and TBI (traumatic brain injury) (HCC) (1996).  PAST SURGICAL HISTORY: He  has a past surgical history that includes Fracture surgery; PEG tube placement (2008); Shoulder arthroscopy w/ rotator cuff repair (Left, X2); Corneal transplant (Left, 2003); Cholecystectomy (N/A, February, 2014); Cardiac catheterization (2002); and ORIF radial head / neck fracture.  Allergies  Allergen Reactions  . Amoxicillin Itching, Rash and Other (See Comments)    NOTED ON MAR Has patient had a PCN reaction causing immediate rash, facial/tongue/throat swelling, SOB or lightheadedness with hypotension: Unknown Has patient had a PCN reaction causing severe rash involving mucus membranes or skin necrosis: Unknown Has patient had a PCN reaction that required hospitalization Unknown Has patient had a PCN reaction occurring within the last 10 years: Unknown If all of the above answers are "NO", then may proceed with Cephalosporin use.     No current facility-administered medications on file prior to encounter.    Current Outpatient Medications on File Prior to Encounter  Medication Sig  . acetaminophen (TYLENOL) 650 MG CR tablet Take 650 mg by mouth every 4 (four) hours as needed for pain.   Marland Kitchen allopurinol (ZYLOPRIM) 300 MG tablet 300 mg by PEG Tube route daily.   . busPIRone (BUSPAR) 15 MG tablet Place 15 mg 2 (two) times daily  into feeding tube.   .  carboxymethylcellulose (REFRESH PLUS) 0.5 % SOLN Place 1 drop into both eyes daily.   . celecoxib (CELEBREX) 200 MG capsule Place 200 mg into feeding tube daily.   . fentaNYL (DURAGESIC - DOSED MCG/HR) 50 MCG/HR Place 1 patch (50 mcg total) onto the skin every 3 (three) days.  . finasteride (PROSCAR) 5 MG tablet Take 5 mg See admin instructions by mouth. Take 1 tablet (5mg ) via tube once daily (may crush)  . fluticasone (FLONASE) 50 MCG/ACT nasal spray Place 1 spray into both nostrils daily.   Marland Kitchen. gabapentin (NEURONTIN) 250 MG/5ML solution Take 300 mg 3 (three) times daily by mouth.  Marland Kitchen. ipratropium-albuterol (DUONEB) 0.5-2.5 (3) MG/3ML SOLN Take 3 mLs by nebulization every 6 (six) hours as needed (for shortness of breath).  . lacosamide (VIMPAT) 10 MG/ML oral solution Take 100 mg 2 (two) times daily by mouth.  . levETIRAcetam (KEPPRA) 100 MG/ML solution Place 250 mg into feeding tube 2 (two) times daily.   Marland Kitchen. lidocaine (XYLOCAINE) 5 % ointment Apply 1 application topically daily. Applied around G-Tube  . lisinopril (PRINIVIL,ZESTRIL) 2.5 MG tablet Take 2.5 mg by mouth daily.  . Melatonin 10 MG TABS Give 10 mg by tube at bedtime.   . Menthol, Topical Analgesic, (BIOFREEZE EX) Apply 1 application topically every 8 (eight) hours.  . Nutritional Supplements (ISOSOURCE 1.5 CAL) LIQD Give by tube. 40 mL/hr x13 hours  . omeprazole (PRILOSEC) 40 MG capsule Take 40 mg See admin instructions by mouth. Give suspension via PEG tube daily GERD.  Marland Kitchen. OXcarbazepine (TRILEPTAL) 300 MG/5ML suspension Place 300 mg at bedtime into feeding tube.   . Oxycodone HCl 10 MG TABS Place 1 tablet (10 mg total) into feeding tube every 6 (six) hours as needed (For pain.).  Marland Kitchen. Phenylephrine-APAP-Guaifenesin (MUCINEX FAST-MAX) 10-650-400 MG/20ML LIQD Place 15 mLs into feeding tube every 12 (twelve) hours.  . pneumococcal 13-valent conjugate vaccine (PREVNAR 13) SUSP injection Inject 0.5 mLs once into the muscle.  Bertram Gala. Polyethyl  Glycol-Propyl Glycol (SYSTANE) 0.4-0.3 % SOLN Apply 1 drop daily to eye.  . polyethylene glycol (MIRALAX / GLYCOLAX) packet Place 17 g into feeding tube daily. Mix with 8 oz water and place in tube every night at bedtime  . saccharomyces boulardii (FLORASTOR) 250 MG capsule Take 1 capsule (250 mg total) by mouth 2 (two) times daily.  Marland Kitchen. topiramate (TOPAMAX) 100 MG tablet Take 1 tablet (100 mg total) by mouth 2 (two) times daily. PEG tube  . traZODone (DESYREL) 100 MG tablet 100 mg by PEG Tube route at bedtime.   Marland Kitchen. venlafaxine (EFFEXOR) 100 MG tablet 75 mg by PEG Tube route 3 (three) times daily.   . Vitamin D, Ergocalciferol, (DRISDOL) 50000 units CAPS capsule Take 50,000 Units by mouth every 30 (thirty) days.  . Lacosamide (VIMPAT) 100 MG TABS Take one tablet by mouth via tube twice daily for seizures (Patient not taking: Reported on 05/19/2017)   FAMILY HISTORY:  His indicated that his mother is alive. He indicated that his father is deceased. He indicated that the status of his brother is unknown.  SOCIAL HISTORY: He  reports that he has quit smoking. he has never used smokeless tobacco. He reports that he does not drink alcohol or use drugs.  REVIEW OF SYSTEMS:   Review of Systems  Unable to perform ROS: Critical illness   SUBJECTIVE:  Intubated and sedated  VITAL SIGNS: BP 119/67   Pulse 97   Temp 100 F (37.8 C) (  Axillary)   Resp (!) 23   Ht 6\' 1"  (1.854 m)   Wt 75.8 kg (167 lb)   SpO2 100%   BMI 22.03 kg/m   HEMODYNAMICS:  Levophed @ 10mcg  VENTILATOR SETTINGS: Vent Mode: PRVC FiO2 (%):  [100 %] 100 % Set Rate:  [22 bmp] 22 bmp Vt Set:  [640 mL] 640 mL PEEP:  [5 cmH20] 5 cmH20 Plateau Pressure:  [27 cmH20] 27 cmH20  INTAKE / OUTPUT: No intake/output data recorded.  PHYSICAL EXAMINATION: General: Initially was on 100% NRB in Respiratory distress with increased work of breathing >> now intubated and sedated, critically ill but appears comfortable Neuro: initially  awake/alert, moving right hand, is tracking, gives "thumbs up" to communicate in response to questions, Is not moving BLE or LUE; Question if paresis after prior CVA. >> now intubated and sedated, coughing on vent, not following commands HEENT: OP clear, MM moist, edentulous, Orally intubated, Creamy secretions seen between vocal cords during intubation.  Cardiovascular: RRR no m/r/g Lungs: Initially loud rhonchi b/l with increased WOB >> now intubated, no increased work of breathing on vent, Lungs coarse b/l Abdomen: PEG tube in place keeps sliding back on its own like threatening to fall out. Abdomen firm and distended >> improved after suctioning 500cc bilious material from PEG tube.  Musculoskeletal: no LE edema GU: chronic foley with cloudy urine and large amount of sediment Skin: no rashes   LABS:  BMET Recent Labs  Lab 05/19/17 0724  NA 133*  K 4.3  CL 99*  CO2 25  BUN 44*  CREATININE 1.21  GLUCOSE 128*   Electrolytes Recent Labs  Lab 05/19/17 0724  CALCIUM 9.2   CBC Recent Labs  Lab 05/19/17 0724  WBC 13.7*  HGB 12.3*  HCT 37.4*  PLT 102*   Coag's Recent Labs  Lab 05/19/17 0724  INR 1.21   Sepsis Markers Recent Labs  Lab 05/19/17 0727 05/19/17 1032  LATICACIDVEN 1.66 1.00   ABG Recent Labs  Lab 05/19/17 0813  PHART 7.266*  PCO2ART 55.3*  PO2ART 98.0   Liver Enzymes Recent Labs  Lab 05/19/17 0724  AST 33  ALT 32  ALKPHOS 105  BILITOT 0.4  ALBUMIN 3.3*   Cardiac Enzymes No results for input(s): TROPONINI, PROBNP in the last 168 hours.  Glucose No results for input(s): GLUCAP in the last 168 hours.  Imaging Dg Chest Portable 1 View  Result Date: 05/19/2017 CLINICAL DATA:  Encounter for shortness of Breath EXAM: PORTABLE CHEST 1 VIEW COMPARISON:  03/03/2017 FINDINGS: Interval removal of left central line. There is bibasilar atelectasis. Low lung volumes. Heart is normal size. Possible small effusions. IMPRESSION: Low lung volumes with  bibasilar atelectasis and possible small effusions. Electronically Signed   By: Charlett NoseKevin  Dover M.D.   On: 05/19/2017 07:33   STUDIES:  CXR: bibasilar atelectasis vs infiltrates  CULTURES: Blood cultures (11/17): pending Urine culture (11/17): pending Sputum culture (11/17): pending  ANTIBIOTICS: Vancomycin 11/17 >> Levoflox 11/17 (1 dose) Meropenem 11/17 (ordered, not given yet)  SIGNIFICANT EVENTS: 11/17: presents to ER with septic shock due to UTI and Pna >> developed respiratory distress after fluid resuscitation, requiring intubation  LINES/TUBES: 20G R-arm (very positional) 11/17 >> 22G L-hand 11/17 Chronic foley from rehab  ETT 11/17 >>   ASSESSMENT / PLAN: 61yoM with h/o TBI and C1 cervical fracture, CVA, DM, HTN, PVD, Seizures, Multiple aspiration pneumonia, Chronic foley with multiple UTI's, Depression with multiple prior suicide attempts, Nonverbal baseline, presents today from his nursing  home with fever (T:104), Hypotension, Tachycardia, and reported AMS (unclear baseline), who was found in the ER to be in septic shock due to UTI. Developed respiratory distress following fluid resuscitation, requiring intubation.   PULMONARY 1. Acute Hypoxic and Hypercapneic respiratory Failure; Respiratory distress; Pneumonia (HCAP vs Aspiration): - continue mechanical ventilation; recheck ABG 30 min s/p intubation and make vent changes as needed - NPO  - GI and DVT prophylaxis - VAP prevention bundle - Daily CXR and ABG - Pneumonia addressed below  2. Acute onset Pulmonary edema: - occurred after appropriate fluid resuscitation in the ER; question if patient has undiagnosed CHF. - order TTE.   CARDIOVASCULAR 1. Shock: thought to be septic; discussed below  2. Hx HTN:  - currently in shock; hold home antihypertensives  RENAL 1. AKI; Chronic Foley: - creatinine 1.21 up from baseline 0.5 - place new foley; monitor UOP; avoid nephrotoxic agents - if renal function doesn't  improve with IVF's, consider obtaining renal ultrasound  GASTROINTESTINAL 1. Chronic Dysphagia; PEG: - question if patient has an ileus in the setting of this septic shock, since he had high gastric residuals and abdominal pain; will keep NPO - obtain KUB - GI prophylaxis  HEMATOLOGIC 1. Chronic Anemia: - Hgb baseline 10-12, currently 12.3 likely from hemoconcentration; continue to monitor  INFECTIOUS 1. Septic shock: due to UTI and Pneumonia (HCAP vs Aspiration): - s/p 2.5L IVF bolus in ER initially with improvement in BP but then became hypotensive again - started levophed gtt; will need a CVC - lactate 1.5; will continue to trend - check procal - UA is consistent with UTI - CXR on my review shows bibasilar atelectasis vs pna; in setting of purulent material suctioned from between vocal cords during intubation, this is most likely due to pneumonia; unclear if HCAP vs Aspiration.  - Pan-cultured; has h/o MDR organisms in urine. Start Vanc and Meropenem.  - will need to change out foley catheter  ENDOCRINE 1. Hx DM: - NPO; cover with SSI  NEUROLOGIC 1. Hx TBI and C1 cervical fracture, Hx CVA, Hx Seizures; Acute encephalopathy: - reportedly was described as having AMS by his nursing home on arrival. At time of my exam he is awake and alert and communicating via giving "thumbs up" with right hand. But it is not clear to me if this is an improvement from his mental status on ER arrival. An outpatient note comments that he is nonverbal, so this may be his baseline.  - continue outpatient meds of keppra and vimpat for his history of seizures   FAMILY  - Updates: I attempted to call every family number listed in his chart with no answers. Left a generic VM on answering machine for them to call the [facility] ER.  - Inter-disciplinary family meet or Palliative Care meeting due by: 05/26/17  60 minutes nonprocedural critical care time    Milana Obey, MD  Pulmonary and Critical Care  Medicine [facility] Pager: [phone]  05/19/2017, 11:41 AM ,

## 2017-05-19 NOTE — Progress Notes (Signed)
Pt temp of 102.1 F orally. RN notified.
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
## 2017-05-19 NOTE — ED Triage Notes (Addendum)
[facility] from [facility] for altered mental status and possible sepsis. Initial sats 80%, NRB placed with improvement of sats. Per staff, pt at baseline communicated with thumbs up and thumbs down and with use of a computer. PEG tube noted with redness around site and drainage. Pt warm to the tough, not following commands at present. CBG 102

## 2017-05-19 NOTE — Procedures (Signed)
Endotracheal Intubation Procedure Note Indication for endotracheal intubation: impending respiratory failure Sedation: etomidate and midazolam Paralytic: succinylcholine Equipment: Macintosh 3 laryngoscope blade and 7.695mm cuffed endotracheal tube Cricoid Pressure: no Number of attempts: 1 ETT location confirmed by by auscultation, by CXR and ETCO2 monitor.  Patient with loud rhonchi, increased work of breathing, pulmonary edema coming out of nose and mouth. Decision made to intubate. Patient alert but nonverbal (baseline) but gave "thumbs up" to intubation when it was discussed with him. Unable to contact family after several phone calls. Intubation performed as discussed above. Patient tolerated well, no complications.

## 2017-05-20 ENCOUNTER — Inpatient Hospital Stay (HOSPITAL_COMMUNITY): Payer: Medicare Other

## 2017-05-20 LAB — MAGNESIUM
Magnesium: 1.9 mg/dL (ref 1.7–2.4)
Magnesium: 2 mg/dL (ref 1.7–2.4)
Magnesium: 1.9 mg/dL (ref 1.7–2.4)

## 2017-05-20 LAB — GLUCOSE, CAPILLARY
GLUCOSE-CAPILLARY: 112 mg/dL — AB (ref 65–99)
GLUCOSE-CAPILLARY: 136 mg/dL — AB (ref 65–99)
GLUCOSE-CAPILLARY: 150 mg/dL — AB (ref 65–99)
GLUCOSE-CAPILLARY: 63 mg/dL — AB (ref 65–99)
GLUCOSE-CAPILLARY: 91 mg/dL (ref 65–99)
Glucose-Capillary: 124 mg/dL — ABNORMAL HIGH (ref 65–99)
Glucose-Capillary: 92 mg/dL (ref 65–99)

## 2017-05-20 LAB — BLOOD GAS, ARTERIAL
ACID-BASE DEFICIT: 2.9 mmol/L — AB (ref 0.0–2.0)
BICARBONATE: 19.6 mmol/L — AB (ref 20.0–28.0)
Drawn by: 36277
FIO2: 70
LHR: 22 {breaths}/min
MECHVT: 640 mL
O2 Saturation: 99.7 %
PEEP/CPAP: 5 cmH2O
PO2 ART: 264 mmHg — AB (ref 83.0–108.0)
Patient temperature: 98.6
pCO2 arterial: 23.9 mmHg — ABNORMAL LOW (ref 32.0–48.0)
pH, Arterial: 7.524 — ABNORMAL HIGH (ref 7.350–7.450)

## 2017-05-20 LAB — BLOOD CULTURE ID PANEL (REFLEXED)
ACINETOBACTER BAUMANNII: NOT DETECTED
CANDIDA ALBICANS: NOT DETECTED
CANDIDA GLABRATA: NOT DETECTED
CANDIDA KRUSEI: NOT DETECTED
CANDIDA PARAPSILOSIS: NOT DETECTED
CANDIDA TROPICALIS: NOT DETECTED
ENTEROBACTER CLOACAE COMPLEX: NOT DETECTED
ENTEROBACTERIACEAE SPECIES: NOT DETECTED
ESCHERICHIA COLI: NOT DETECTED
Enterococcus species: NOT DETECTED
Haemophilus influenzae: NOT DETECTED
KLEBSIELLA OXYTOCA: NOT DETECTED
KLEBSIELLA PNEUMONIAE: NOT DETECTED
Listeria monocytogenes: NOT DETECTED
Neisseria meningitidis: NOT DETECTED
Proteus species: NOT DETECTED
Pseudomonas aeruginosa: NOT DETECTED
Serratia marcescens: NOT DETECTED
Staphylococcus aureus (BCID): NOT DETECTED
Staphylococcus species: NOT DETECTED
Streptococcus agalactiae: NOT DETECTED
Streptococcus pneumoniae: NOT DETECTED
Streptococcus pyogenes: NOT DETECTED
Streptococcus species: NOT DETECTED

## 2017-05-20 LAB — CBC
HEMATOCRIT: 30 % — AB (ref 39.0–52.0)
HEMOGLOBIN: 9.9 g/dL — AB (ref 13.0–17.0)
MCH: 31.5 pg (ref 26.0–34.0)
MCHC: 33 g/dL (ref 30.0–36.0)
MCV: 95.5 fL (ref 78.0–100.0)
Platelets: 88 10*3/uL — ABNORMAL LOW (ref 150–400)
RBC: 3.14 MIL/uL — AB (ref 4.22–5.81)
RDW: 14.3 % (ref 11.5–15.5)
WBC: 8.1 10*3/uL (ref 4.0–10.5)

## 2017-05-20 LAB — BASIC METABOLIC PANEL
ANION GAP: 6 (ref 5–15)
ANION GAP: 8 (ref 5–15)
BUN: 36 mg/dL — ABNORMAL HIGH (ref 6–20)
BUN: 38 mg/dL — AB (ref 6–20)
CALCIUM: 8.5 mg/dL — AB (ref 8.9–10.3)
CALCIUM: 8.7 mg/dL — AB (ref 8.9–10.3)
CHLORIDE: 114 mmol/L — AB (ref 101–111)
CO2: 20 mmol/L — AB (ref 22–32)
CO2: 16 mmol/L — ABNORMAL LOW (ref 22–32)
CREATININE: 0.83 mg/dL (ref 0.61–1.24)
Chloride: 110 mmol/L (ref 101–111)
Creatinine, Ser: 0.94 mg/dL (ref 0.61–1.24)
GFR calc Af Amer: >60 mL/min (ref >60)
GFR calc non Af Amer: >60 mL/min (ref >60)
Glucose, Bld: 144 mg/dL — ABNORMAL HIGH (ref 65–99)
Glucose, Bld: 145 mg/dL — ABNORMAL HIGH (ref 65–99)
POTASSIUM: 4.2 mmol/L (ref 3.5–5.1)
Potassium: 2.8 mmol/L — ABNORMAL LOW (ref 3.5–5.1)
Sodium: 136 mmol/L (ref 135–145)
Sodium: 138 mmol/L (ref 135–145)

## 2017-05-20 LAB — PHOSPHORUS
PHOSPHORUS: 3.3 mg/dL (ref 2.5–4.6)
PHOSPHORUS: 3 mg/dL (ref 2.5–4.6)
Phosphorus: 1.5 mg/dL — ABNORMAL LOW (ref 2.5–4.6)

## 2017-05-20 LAB — URINE CULTURE: Culture: <10000 — AB

## 2017-05-20 LAB — TROPONIN I: Troponin I: <0.03 ng/mL (ref <0.03)

## 2017-05-20 MED ORDER — OXCARBAZEPINE 300 MG/5ML PO SUSP
300.0000 mg | Freq: Every day | ORAL | Status: DC
  Administered 2017-05-20 – 2017-05-26 (×7): 300 mg
  Filled 2017-05-20 (×8): qty 5

## 2017-05-20 MED ORDER — VITAL HIGH PROTEIN PO LIQD
1000.0000 mL | ORAL | Status: DC
  Administered 2017-05-20: 1000 mL

## 2017-05-20 MED ORDER — PRO-STAT SUGAR FREE PO LIQD
30.0000 mL | Freq: Two times a day (BID) | ORAL | Status: DC
  Administered 2017-05-20 – 2017-05-21 (×4): 30 mL
  Filled 2017-05-20 (×3): qty 30

## 2017-05-20 MED ORDER — BUSPIRONE HCL 15 MG PO TABS
15.0000 mg | ORAL_TABLET | Freq: Two times a day (BID) | ORAL | Status: DC
  Administered 2017-05-20 – 2017-05-27 (×15): 15 mg
  Filled 2017-05-20 (×15): qty 1

## 2017-05-20 MED ORDER — PANTOPRAZOLE SODIUM 40 MG PO PACK
40.0000 mg | PACK | ORAL | Status: DC
  Administered 2017-05-20 – 2017-05-27 (×8): 40 mg
  Filled 2017-05-20 (×8): qty 20

## 2017-05-20 MED ORDER — GABAPENTIN 250 MG/5ML PO SOLN
300.0000 mg | Freq: Three times a day (TID) | ORAL | Status: DC
  Administered 2017-05-20 – 2017-05-27 (×21): 300 mg
  Filled 2017-05-20 (×26): qty 6

## 2017-05-20 MED ORDER — GABAPENTIN 250 MG/5ML PO SOLN
300.0000 mg | Freq: Three times a day (TID) | ORAL | Status: DC
  Administered 2017-05-20 (×2): 300 mg via ORAL
  Filled 2017-05-20 (×4): qty 6

## 2017-05-20 MED ORDER — DEXTROSE 50 % IV SOLN
INTRAVENOUS | Status: AC
  Administered 2017-05-20: 25 mL
  Filled 2017-05-20: qty 50

## 2017-05-20 MED ORDER — TRAZODONE HCL 100 MG PO TABS
100.0000 mg | ORAL_TABLET | Freq: Every day | ORAL | Status: DC
  Administered 2017-05-20 – 2017-05-26 (×7): 100 mg
  Filled 2017-05-20 (×7): qty 1

## 2017-05-20 MED ORDER — LACOSAMIDE 10 MG/ML PO SOLN
100.0000 mg | Freq: Two times a day (BID) | ORAL | Status: DC
  Administered 2017-05-20 – 2017-05-24 (×8): 100 mg
  Filled 2017-05-20 (×7): qty 12

## 2017-05-20 MED ORDER — VENLAFAXINE HCL 75 MG PO TABS
75.0000 mg | ORAL_TABLET | Freq: Three times a day (TID) | ORAL | Status: DC
  Administered 2017-05-20 – 2017-05-27 (×23): 75 mg
  Filled 2017-05-20 (×28): qty 1

## 2017-05-20 MED ORDER — CHLORHEXIDINE GLUCONATE CLOTH 2 % EX PADS
6.0000 | MEDICATED_PAD | Freq: Every day | CUTANEOUS | Status: AC
  Administered 2017-05-20 – 2017-05-23 (×4): 6 via TOPICAL

## 2017-05-20 MED ORDER — MELATONIN 3 MG PO TABS
9.0000 mg | ORAL_TABLET | Freq: Every day | ORAL | Status: DC
  Administered 2017-05-20 – 2017-05-26 (×7): 9 mg
  Filled 2017-05-20 (×8): qty 3

## 2017-05-20 MED ORDER — MUPIROCIN 2 % EX OINT
1.0000 "application " | TOPICAL_OINTMENT | Freq: Two times a day (BID) | CUTANEOUS | Status: AC
  Administered 2017-05-20 – 2017-05-24 (×10): 1 via NASAL
  Filled 2017-05-20 (×2): qty 22

## 2017-05-20 MED ORDER — POTASSIUM PHOSPHATES 15 MMOLE/5ML IV SOLN
30.0000 mmol | Freq: Once | INTRAVENOUS | Status: AC
  Administered 2017-05-20: 30 mmol via INTRAVENOUS
  Filled 2017-05-20: qty 10

## 2017-05-20 MED ORDER — MAGNESIUM SULFATE IN D5W 1-5 GM/100ML-% IV SOLN
1.0000 g | Freq: Once | INTRAVENOUS | Status: AC
  Administered 2017-05-20: 1 g via INTRAVENOUS
  Filled 2017-05-20: qty 100

## 2017-05-20 NOTE — Progress Notes (Signed)
eLink Physician-Brief Progress Note Patient Name: Alexander [name] DOB: 28-Jul-1955 MRN: [id number]   Date of Service  05/20/2017  HPI/Events of Note  Frequent PVCs. Last Mg++ = 1.9.  eICU Interventions  Will order: 1. BMP STAT. 2. Replace Mg++.     Intervention Category Major Interventions: Arrhythmia - evaluation and management  [name] 05/20/2017, 10:40 PM

## 2017-05-20 NOTE — Progress Notes (Signed)
PULMONARY / CRITICAL CARE MEDICINE   Name: Alexander [name] MRN: [id number] DOB: [birth year]    ADMISSION DATE:  05/19/2017  REFERRING MD: Dr. Rosalia Hammersay, ER  CHIEF COMPLAINT:  Low blood pressure  HISTORY OF PRESENT ILLNESS:   61 yo male from NH with fever, low BP, and altered mental status.  Developed worsening respiratory status and required intubation. Hx of TBI and C1 fx, CVA, DM, HTN, PAD, Seizures, recurrent aspiration, neurogenic bladder, recurrent UTI, Depression, non verbal.  SUBJECTIVE:  Remains on full vent support.  VITAL SIGNS: BP 99/61   Pulse 62   Temp 98.6 F (37 C) (Oral)   Resp 20   Ht 6\' 1"  (1.854 m)   Wt 167 lb 1.7 oz (75.8 kg)   SpO2 100%   BMI 22.05 kg/m   VENTILATOR SETTINGS: Vent Mode: PRVC FiO2 (%):  [70 %-80 %] 70 % Set Rate:  [22 bmp] 22 bmp Vt Set:  [640 mL] 640 mL PEEP:  [5 cmH20] 5 cmH20 Plateau Pressure:  [21 cmH20-28 cmH20] 21 cmH20  INTAKE / OUTPUT: I/O last 3 completed shifts: In: 3275.2 [I.V.:1965.2; Other:60; IV Piggyback:1250] Out: 1600 [Urine:1600]  PHYSICAL EXAMINATION:  General - alert Eyes - pupils reactive ENT - ETT in place Cardiac - regular, no murmur Chest - scattered rhonchi Abd - soft, non tender Ext - decreased muscle bulk Skin - sacral ulcer Neuro - follows simple commands  LABS:  BMET Recent Labs  Lab 05/19/17 0724 05/20/17 0255  NA 133* 136  K 4.3 2.8*  CL 99* 110  CO2 25 20*  BUN 44* 36*  CREATININE 1.21 0.94  GLUCOSE 128* 144*    Electrolytes Recent Labs  Lab 05/19/17 0724 05/20/17 0255  CALCIUM 9.2 8.5*  MG  --  1.9  PHOS  --  1.5*    CBC Recent Labs  Lab 05/19/17 0724 05/20/17 0255  WBC 13.7* 8.1  HGB 12.3* 9.9*  HCT 37.4* 30.0*  PLT 102* 88*    Coag's Recent Labs  Lab 05/19/17 0724  INR 1.21    Sepsis Markers Recent Labs  Lab 05/19/17 1032 05/19/17 1516 05/19/17 1916  LATICACIDVEN 1.00 2.9* 2.7*  PROCALCITON  --  0.52  --     ABG Recent Labs  Lab  05/19/17 0813 05/19/17 1300 05/20/17 0330  PHART 7.266* 7.406 7.524*  PCO2ART 55.3* 33.9 23.9*  PO2ART 98.0 202* 264*    Liver Enzymes Recent Labs  Lab 05/19/17 0724  AST 33  ALT 32  ALKPHOS 105  BILITOT 0.4  ALBUMIN 3.3*    Cardiac Enzymes Recent Labs  Lab 05/19/17 1516 05/19/17 1916 05/20/17 0255  TROPONINI 0.06* <0.03 <0.03    Glucose Recent Labs  Lab 05/19/17 1324 05/19/17 1630 05/19/17 1941 05/20/17 0020 05/20/17 0337 05/20/17 0845  GLUCAP 99 75 106* 150* 136* 124*    Imaging Dg Chest Port 1 View  Result Date: 05/20/2017 CLINICAL DATA:  Respiratory failure EXAM: PORTABLE CHEST 1 VIEW COMPARISON:  05/19/2017 FINDINGS: Endotracheal tube is in stable position. Heart is borderline in size. Vascular congestion with interstitial prominence, likely mild interstitial edema. Right base atelectasis or infiltrate. Suspect small effusions. IMPRESSION: Mild interstitial prominence concerning for interstitial edema. Right lower lobe atelectasis or infiltrate, similar prior study. Small bilateral effusions. Electronically Signed   By: Charlett NoseKevin  Dover M.D.   On: 05/20/2017 08:17   Dg Chest Port 1 View  Result Date: 05/19/2017 CLINICAL DATA:  ET tube placement.  Respiratory failure with hypoxia EXAM: PORTABLE CHEST 1 VIEW  COMPARISON:  05/19/2017 FINDINGS: Endotracheal tube has been retracted. The tip is 3 cm above the carina. Bibasilar atelectasis again noted, unchanged. Heart is normal size. No visible effusions. IMPRESSION: Endotracheal tube 3 cm above the carina. Continued bibasilar atelectasis. Electronically Signed   By: Charlett NoseKevin  Dover M.D.   On: 05/19/2017 11:48   Dg Chest Portable 1 View  Result Date: 05/19/2017 CLINICAL DATA:  ET tube placement EXAM: PORTABLE CHEST 1 VIEW COMPARISON:  05/19/2017 FINDINGS: Endotracheal tube is in the right mainstem bronchus. Recommend retracting approximately 3 cm. Heart is normal size. Bibasilar opacities, likely atelectasis. No acute  bony abnormality. IMPRESSION: Endotracheal tube in the right mainstem bronchus. Recommend retracting approximately 3 cm. Bibasilar atelectasis. Electronically Signed   By: Charlett NoseKevin  Dover M.D.   On: 05/19/2017 11:48   Dg Abd Portable 1v  Result Date: 05/19/2017 CLINICAL DATA:  Possible ileus. Possible sepsis. Pt has a peg tube with redness and draining around the site. EXAM: PORTABLE ABDOMEN - 1 VIEW COMPARISON:  04/14/2017 FINDINGS: Balloon retention gastrostomy catheter projects in expected location. Stomach and small bowel decompressed. Moderate colonic and rectal fecal material. Cholecystectomy clips. Degenerative changes in the lumbar spine and hips. IMPRESSION: 1. Nonobstructive bowel gas pattern with moderate colonic and rectal fecal material. Electronically Signed   By: Corlis Leak  Hassell M.D.   On: 05/19/2017 12:45     STUDIES:   CULTURES: Blood 11/17 >> Urine 11/17 >> Sputum 11/17 >> Legionella 11/17 >>  Pneumococcal 11/17 >> negative  ANTIBIOTICS: Vancomycin 11/17 >> Meropenem 11/17 >>  SIGNIFICANT EVENTS: 11/17 Admit  LINES/TUBES: 11/17 ETT >>   DISCUSSION: 61 yo with septic shock, VDRF from recurrent aspiration pneumonia, UTI, and pressure wounds.  ASSESSMENT / PLAN:  Acute respiratory failure with hypoxia. - full vent support - f/u CXR  Septic shock from Aspiration PNA, UTI, pressure wounds. - off pressors 11/18 - continue IV fluids - day 2 of Abx - wound care - f/u Echo  Acute metabolic encephalopathy. Hx of TBI, CVA, C 1 fx, seizures. - RASS goal 0 to -1  Dysphagia s/p PEG. - tube feeds  Anemia of critical illness and chronic disease. Thrombocytopenia. - f/u CBC  DM type II. - SSI  Hypokalemia, hypophosphatemia. - replace as needed  DVT prophylaxis - SQ heparin SUP - protonix Nutrition - tube feeds Goals of care - full code  CC time 33 minutes  Coralyn HellingVineet Carollyn Etcheverry, MD [facility]monary/Critical Care 05/20/2017, 11:49 AM Pager:  [phone] After  3pm call: [phone]

## 2017-05-20 NOTE — Progress Notes (Signed)
eLink Physician-Brief Progress Note Patient Name: Alexander [name] DOB: 09-25-1955 MRN: [id number]   Date of Service  05/20/2017  HPI/Events of Note  K+ = 2.8, PO4--- = 1.5 and Creatinine = 0.94.Marland Kitchen.  eICU Interventions  Will replace K+ and PO4---.     Intervention Category Major Interventions: Electrolyte abnormality - evaluation and management  Sommer,Steven Eugene 05/20/2017, 5:25 AM

## 2017-05-20 NOTE — Progress Notes (Signed)
PHARMACY - PHYSICIAN COMMUNICATION CRITICAL VALUE ALERT - BLOOD CULTURE IDENTIFICATION (BCID)  Results for orders placed or performed during the hospital encounter of 05/19/17  Blood Culture ID Panel (Reflexed) (Collected: 05/19/2017  7:20 AM)  Result Value Ref Range   Enterococcus species NOT DETECTED NOT DETECTED   Listeria monocytogenes NOT DETECTED NOT DETECTED   Staphylococcus species NOT DETECTED NOT DETECTED   Staphylococcus aureus NOT DETECTED NOT DETECTED   Streptococcus species NOT DETECTED NOT DETECTED   Streptococcus agalactiae NOT DETECTED NOT DETECTED   Streptococcus pneumoniae NOT DETECTED NOT DETECTED   Streptococcus pyogenes NOT DETECTED NOT DETECTED   Acinetobacter baumannii NOT DETECTED NOT DETECTED   Enterobacteriaceae species NOT DETECTED NOT DETECTED   Enterobacter cloacae complex NOT DETECTED NOT DETECTED   Escherichia coli NOT DETECTED NOT DETECTED   Klebsiella oxytoca NOT DETECTED NOT DETECTED   Klebsiella pneumoniae NOT DETECTED NOT DETECTED   Proteus species NOT DETECTED NOT DETECTED   Serratia marcescens NOT DETECTED NOT DETECTED   Haemophilus influenzae NOT DETECTED NOT DETECTED   Neisseria meningitidis NOT DETECTED NOT DETECTED   Pseudomonas aeruginosa NOT DETECTED NOT DETECTED   Candida albicans NOT DETECTED NOT DETECTED   Candida glabrata NOT DETECTED NOT DETECTED   Candida krusei NOT DETECTED NOT DETECTED   Candida parapsilosis NOT DETECTED NOT DETECTED   Candida tropicalis NOT DETECTED NOT DETECTED   Name of physician (or Provider) Contacted: Dr. Arsenio LoaderSommer   Changes to prescribed antibiotics required: GNR in anerobic bottle only. On broad spectrum meropenem and vancomycin for PNA/UTI. Noted Tmax 104.7 (now 98.7), WBC 13.7, LA 2.7. No changes at this time. Continue to follow clinical picture and final cultures closely.   Einar CrowKatherine Ambrose Wile, PharmD Clinical Pharmacist 05/20/17 1:39 AM

## 2017-05-21 ENCOUNTER — Inpatient Hospital Stay (HOSPITAL_COMMUNITY): Payer: Medicare Other

## 2017-05-21 DIAGNOSIS — J9601 Acute respiratory failure with hypoxia: Secondary | ICD-10-CM

## 2017-05-21 LAB — CBC
HCT: 26.6 % — ABNORMAL LOW (ref 39.0–52.0)
HEMATOCRIT: 29.1 % — AB (ref 39.0–52.0)
HEMOGLOBIN: 8.8 g/dL — AB (ref 13.0–17.0)
Hemoglobin: 9.4 g/dL — ABNORMAL LOW (ref 13.0–17.0)
MCH: 31.1 pg (ref 26.0–34.0)
MCH: 30.8 pg (ref 26.0–34.0)
MCHC: 33.1 g/dL (ref 30.0–36.0)
MCHC: 32.3 g/dL (ref 30.0–36.0)
MCV: 94 fL (ref 78.0–100.0)
MCV: 95.4 fL (ref 78.0–100.0)
PLATELETS: 98 10*3/uL — AB (ref 150–400)
Platelets: 86 10*3/uL — ABNORMAL LOW (ref 150–400)
RBC: 2.83 MIL/uL — AB (ref 4.22–5.81)
RBC: 3.05 MIL/uL — AB (ref 4.22–5.81)
RDW: 14.5 % (ref 11.5–15.5)
RDW: 14.6 % (ref 11.5–15.5)
WBC: 4.1 10*3/uL (ref 4.0–10.5)
WBC: 3.5 10*3/uL — AB (ref 4.0–10.5)

## 2017-05-21 LAB — BASIC METABOLIC PANEL
ANION GAP: 6 (ref 5–15)
Anion gap: 6 (ref 5–15)
BUN: 32 mg/dL — ABNORMAL HIGH (ref 6–20)
BUN: 34 mg/dL — AB (ref 6–20)
CO2: 18 mmol/L — AB (ref 22–32)
CO2: 19 mmol/L — ABNORMAL LOW (ref 22–32)
CREATININE: 0.7 mg/dL (ref 0.61–1.24)
CREATININE: 0.73 mg/dL (ref 0.61–1.24)
Calcium: 8.3 mg/dL — ABNORMAL LOW (ref 8.9–10.3)
Calcium: 8.1 mg/dL — ABNORMAL LOW (ref 8.9–10.3)
Chloride: 113 mmol/L — ABNORMAL HIGH (ref 101–111)
Chloride: 115 mmol/L — ABNORMAL HIGH (ref 101–111)
Glucose, Bld: 136 mg/dL — ABNORMAL HIGH (ref 65–99)
Glucose, Bld: 143 mg/dL — ABNORMAL HIGH (ref 65–99)
Potassium: 2.5 mmol/L — CL (ref 3.5–5.1)
Potassium: 3.7 mmol/L (ref 3.5–5.1)
SODIUM: 140 mmol/L (ref 135–145)
Sodium: 137 mmol/L (ref 135–145)

## 2017-05-21 LAB — GLUCOSE, CAPILLARY
GLUCOSE-CAPILLARY: 101 mg/dL — AB (ref 65–99)
GLUCOSE-CAPILLARY: 118 mg/dL — AB (ref 65–99)
GLUCOSE-CAPILLARY: 122 mg/dL — AB (ref 65–99)
GLUCOSE-CAPILLARY: 128 mg/dL — AB (ref 65–99)
GLUCOSE-CAPILLARY: 141 mg/dL — AB (ref 65–99)
Glucose-Capillary: 89 mg/dL (ref 65–99)
Glucose-Capillary: 124 mg/dL — ABNORMAL HIGH (ref 65–99)

## 2017-05-21 LAB — PHOSPHORUS
PHOSPHORUS: 2.1 mg/dL — AB (ref 2.5–4.6)
PHOSPHORUS: 4.2 mg/dL (ref 2.5–4.6)

## 2017-05-21 LAB — CULTURE, RESPIRATORY W GRAM STAIN: Culture: NORMAL

## 2017-05-21 LAB — ECHOCARDIOGRAM COMPLETE
AVLVOTPG: 4 mmHg
E decel time: 180 msec
EERAT: 5.39
HEIGHTINCHES: 73 in
LA vol A4C: 42.9 ml
LA vol index: 22.7 mL/m2
LAVOL: 45.4 mL
LV E/e' medial: 5.39
LV TDI E'LATERAL: 12.5
LV e' LATERAL: 12.5 cm/s
LVEEAVG: 5.39
LVOT VTI: 19.4 cm
LVOTPV: 101 cm/s
MV Dec: 180
MV pk A vel: 64 m/s
MV pk E vel: 67.4 m/s
RV LATERAL S' VELOCITY: 14.6 cm/s
TAPSE: 22.8 mm
TDI e' medial: 9.46
WEIGHTICAEL: 2698.43 [oz_av]

## 2017-05-21 LAB — ABO/RH: ABO/RH(D): A POS

## 2017-05-21 LAB — TYPE AND SCREEN
ABO/RH(D): A POS
ANTIBODY SCREEN: NEGATIVE

## 2017-05-21 LAB — LEGIONELLA PNEUMOPHILA SEROGP 1 UR AG: L. PNEUMOPHILA SEROGP 1 UR AG: NEGATIVE

## 2017-05-21 LAB — CULTURE, RESPIRATORY

## 2017-05-21 LAB — MAGNESIUM
MAGNESIUM: 2 mg/dL (ref 1.7–2.4)
Magnesium: 1.9 mg/dL (ref 1.7–2.4)

## 2017-05-21 MED ORDER — VITAL AF 1.2 CAL PO LIQD
1000.0000 mL | ORAL | Status: DC
  Administered 2017-05-21 – 2017-05-27 (×8): 1000 mL
  Filled 2017-05-21 (×9): qty 1000

## 2017-05-21 MED ORDER — DEXTROSE 5 % IV SOLN
30.0000 mmol | Freq: Once | INTRAVENOUS | Status: AC
  Administered 2017-05-21: 30 mmol via INTRAVENOUS
  Filled 2017-05-21: qty 10

## 2017-05-21 MED ORDER — POTASSIUM CHLORIDE 20 MEQ/15ML (10%) PO SOLN
40.0000 meq | ORAL | Status: AC
  Administered 2017-05-21 (×2): 40 meq
  Filled 2017-05-21 (×2): qty 30

## 2017-05-21 MED ORDER — FENTANYL CITRATE (PF) 100 MCG/2ML IJ SOLN
25.0000 ug | INTRAMUSCULAR | Status: DC | PRN
  Administered 2017-05-21 – 2017-05-22 (×4): 50 ug via INTRAVENOUS
  Filled 2017-05-21 (×4): qty 2

## 2017-05-21 MED ORDER — INSULIN ASPART 100 UNIT/ML ~~LOC~~ SOLN
0.0000 [IU] | SUBCUTANEOUS | Status: DC
  Administered 2017-05-21: 2 [IU] via SUBCUTANEOUS

## 2017-05-21 NOTE — Progress Notes (Signed)
Pt asleep.

## 2017-05-21 NOTE — Progress Notes (Signed)
eLink Physician-Brief Progress Note Patient Name: Alexander [name] DOB: 04/22/1956 MRN: [id number]   Date of Service  05/21/2017  HPI/Events of Note  K+ = 2.5 and Creatinine = 0.70.  eICU Interventions  Will replace K+.      Intervention Category Major Interventions: Electrolyte abnormality - evaluation and management  Sommer,Steven Eugene 05/21/2017, 5:26 AM

## 2017-05-21 NOTE — Progress Notes (Signed)
Initial Nutrition Assessment  DOCUMENTATION CODES:   Non-severe (moderate) malnutrition in context of chronic illness  INTERVENTION:    Vital AF 1.2 at 65 ml/h (1560 ml per day)  Provides 1872 kcal, 117 gm protein, 1265 ml free water daily  NUTRITION DIAGNOSIS:   Moderate Malnutrition related to chronic illness(CNS degenerative disease) as evidenced by moderate fat depletion, mild muscle depletion.  GOAL:   Patient will meet greater than or equal to 90% of their needs  MONITOR:   Vent status, TF tolerance, Labs, I & O's  REASON FOR ASSESSMENT:   Consult Enteral/tube feeding initiation and management  ASSESSMENT:   61 yo male with PMH of DM, HTN, Seizure, HLD, CNS degenerative DZ, keratoconus, supranuclear palsy, cervical myelopathy, depression, CVA, asp PNA, PEG, TBI who was admitted from SNF on 11/17 with low blood pressure and VDRF from recurrent aspiration PNA, UTI, and pressure wounds.   Discussed patient in ICU rounds and with RN today. Patient has a PEG; unsure of usual TF regimen. Received MD Consult for TF initiation and management. Currently receiving Vital High Protein at 40 ml/h with Prostat 30 ml BID per adult tube feeding protocol. Patient is currently intubated on ventilator support MV: 8.3 L/min Temp (24hrs), Avg:98.2 F (36.8 C), Min:97.6 F (36.4 C), Max:99.6 F (37.6 C)   Labs reviewed. CBG's: 141-128  Medications reviewed and include potassium phosphate.  NUTRITION - FOCUSED PHYSICAL EXAM:    Most Recent Value  Orbital Region  Moderate depletion  Upper Arm Region  Moderate depletion  Thoracic and Lumbar Region  Unable to assess  Buccal Region  Unable to assess  Temple Region  Moderate depletion  Clavicle Bone Region  Mild depletion  Clavicle and Acromion Bone Region  Mild depletion  Scapular Bone Region  Unable to assess  Dorsal Hand  Unable to assess  Patellar Region  No depletion  Anterior Thigh Region  No depletion  Posterior Calf  Region  Unable to assess  Edema (RD Assessment)  Unable to assess  Hair  Reviewed  Eyes  Unable to assess  Mouth  Unable to assess  Skin  Reviewed  Nails  Unable to assess      Pt meets criteria for moderate MALNUTRITION in the context of chronic illness as evidenced by mild depletion of muscle mass and moderate depletion of fat mass.  Diet Order:  Diet NPO time specified  EDUCATION NEEDS:   No education needs have been identified at this time  Skin:  Skin Assessment: Skin Integrity Issues: Skin Integrity Issues:: Stage II Stage II: sacrum  Last BM:  unknown  Height:   Ht Readings from Last 1 Encounters:  05/19/17 6\' 1"  (1.854 m)    Weight:   Wt Readings from Last 1 Encounters:  05/21/17 168 lb 10.4 oz (76.5 kg)    Ideal Body Weight:  83.6 kg  BMI:  Body mass index is 22.25 kg/m.  Estimated Nutritional Needs:   Kcal:  1900  Protein:  110-125 gm  Fluid:  1.9 L    Joaquin CourtsKimberly Pleas Carneal, RD, LDN, CNSC Pager [phone] After Hours Pager [phone]

## 2017-05-21 NOTE — Progress Notes (Signed)
Patient weaned for 4 hours on 20/5 at 40%, wean stopped due to patient effort decrease. RR dropped to 5. Patient is now back on full support, vitals stable, sats 98%. RT will continue to monitor.

## 2017-05-21 NOTE — Progress Notes (Signed)
PULMONARY / CRITICAL CARE MEDICINE   Name: Alexander [name] MRN: [id number] DOB: 03/05/1956    ADMISSION DATE:  05/19/2017  REFERRING MD: Dr. Rosalia Hammersay, ER  CHIEF COMPLAINT:  Low blood pressure  HISTORY OF PRESENT ILLNESS:   61 yo male from NH with fever, low BP, and altered mental status.  Developed worsening respiratory status and required intubation. Hx of TBI and C1 fx, CVA, DM, HTN, PAD, Seizures, recurrent aspiration, neurogenic bladder, recurrent UTI, Depression, non verbal.  SUBJECTIVE:  No acute issues.  Started weaning trial this AM    VITAL SIGNS: BP 109/66   Pulse (!) 51   Temp 97.7 F (36.5 C) (Oral)   Resp (!) 22   Ht 6\' 1"  (1.854 m)   Wt 76.5 kg (168 lb 10.4 oz)   SpO2 98%   BMI 22.25 kg/m   VENTILATOR SETTINGS: Vent Mode: PRVC FiO2 (%):  [40 %-60 %] 40 % Set Rate:  [22 bmp] 22 bmp Vt Set:  [640 mL] 640 mL PEEP:  [5 cmH20] 5 cmH20 Plateau Pressure:  [23 cmH20-29 cmH20] 29 cmH20  INTAKE / OUTPUT: I/O last 3 completed shifts: In: 6458.8 [I.V.:4348.8; Other:330; NG/GT:680; IV Piggyback:1100] Out: 2340 [Urine:2340]  PHYSICAL EXAMINATION:  General - Adult male, NAD Eyes - pupils reactive ENT - ETT in place Cardiac - regular, no murmur Chest - CTAB Abd - soft, non tender Ext - decreased muscle bulk Skin - sacral ulcer Neuro - follows simple commands  LABS:  BMET Recent Labs  Lab 05/20/17 0255 05/20/17 2242 05/21/17 0355  NA 136 138 137  K 2.8* 4.2 2.5*  CL 110 114* 113*  CO2 20* 16* 18*  BUN 36* 38* 32*  CREATININE 0.94 0.83 0.70  GLUCOSE 144* 145* 136*    Electrolytes Recent Labs  Lab 05/20/17 0255 05/20/17 1318 05/20/17 1710 05/20/17 2242 05/21/17 0355  CALCIUM 8.5*  --   --  8.7* 8.3*  MG 1.9 2.0 1.9  --  2.0  PHOS 1.5* 3.3 3.0  --  2.1*    CBC Recent Labs  Lab 05/19/17 0724 05/20/17 0255 05/21/17 0355  WBC 13.7* 8.1 4.1  HGB 12.3* 9.9* 8.8*  HCT 37.4* 30.0* 26.6*  PLT 102* 88* 98*    Coag's Recent Labs  Lab  05/19/17 0724  INR 1.21    Sepsis Markers Recent Labs  Lab 05/19/17 1032 05/19/17 1516 05/19/17 1916  LATICACIDVEN 1.00 2.9* 2.7*  PROCALCITON  --  0.52  --     ABG Recent Labs  Lab 05/19/17 0813 05/19/17 1300 05/20/17 0330  PHART 7.266* 7.406 7.524*  PCO2ART 55.3* 33.9 23.9*  PO2ART 98.0 202* 264*    Liver Enzymes Recent Labs  Lab 05/19/17 0724  AST 33  ALT 32  ALKPHOS 105  BILITOT 0.4  ALBUMIN 3.3*    Cardiac Enzymes Recent Labs  Lab 05/19/17 1516 05/19/17 1916 05/20/17 0255  TROPONINI 0.06* <0.03 <0.03    Glucose Recent Labs  Lab 05/20/17 1615 05/20/17 1718 05/20/17 1928 05/21/17 0101 05/21/17 0340 05/21/17 0825  GLUCAP 63* 91 92 122* 89 141*    Imaging Dg Chest Port 1 View  Result Date: 05/21/2017 CLINICAL DATA:  Hypoxia EXAM: PORTABLE CHEST 1 VIEW COMPARISON:  May 20, 2017 FINDINGS: Endotracheal tube tip is 2.9 cm above the carina. No pneumothorax. A skin fold is noted on the right. There are pleural effusions bilaterally with patchy airspace consolidation in the bases. Heart is upper normal in size. There is mild pulmonary venous hypertension. No  adenopathy. There old healed rib fractures on the right, unchanged. IMPRESSION: Endotracheal tube as described without pneumothorax. Small pleural effusions with patchy airspace opacity, likely pneumonia, in the bases. There is felt to be a degree of underlying pulmonary vascular congestion. Skin fold noted on the right. Electronically Signed   By: Bretta BangWilliam  Woodruff III M.D.   On: 05/21/2017 07:08     STUDIES:  CXR 11/19 > small b/l effusions, possible PNA Echo 11/17 >   CULTURES: Blood 11/17 >> GNRs >  Urine 11/17 >> Sputum 11/17 >> Legionella 11/17 >>  Pneumococcal 11/17 >> negative  ANTIBIOTICS: Vancomycin 11/17 >> Meropenem 11/17 >>  SIGNIFICANT EVENTS: 11/17 Admit  LINES/TUBES: 11/17 ETT >>   DISCUSSION: 61 yo with septic shock, VDRF from recurrent aspiration pneumonia,  UTI, and pressure wounds.  ASSESSMENT / PLAN:  Acute respiratory failure with hypoxia. - continue PS wean for now, wean down PS (currently at 20/5) - f/u CXR  Septic shock from Aspiration PNA, UTI, pressure wounds - was on pressors but turned off 11/18. - continue fluids, abx - wound care - f/u Echo  Acute metabolic encephalopathy. Hx of TBI, CVA, C 1 fx, seizures. - Sedation: Precedex gtt / fentanyl PRN / midazolam PRN - RASS goal 0 to -1 - Daily WUA  Dysphagia s/p PEG. - tube feeds  Anemia of critical illness and chronic disease. Thrombocytopenia. - f/u CBC  DM type II. - SSI  Hypokalemia, hypophosphatemia - s/p potassium repletion AM 11/19. - 30mmol K phos now - Repeat BMP today at 1300  DVT prophylaxis - SQ heparin SUP - protonix Nutrition - tube feeds Goals of care - full code  CC time: 30 min.   Rutherford Guysahul Akito Boomhower, [place] - C [facility] Pager: [phone]  or [phone] 05/21/2017, 9:30 AM

## 2017-05-21 NOTE — Progress Notes (Signed)
  Echocardiogram 2D Echocardiogram has been performed.  Delcie RochENNINGTON, Cristine Daw 05/21/2017, 12:54 PM

## 2017-05-22 ENCOUNTER — Inpatient Hospital Stay (HOSPITAL_COMMUNITY): Payer: Medicare Other

## 2017-05-22 LAB — CBC WITH DIFFERENTIAL/PLATELET
Basophils Absolute: 0 10*3/uL (ref 0.0–0.1)
Basophils Relative: 0 %
EOS ABS: 0.2 10*3/uL (ref 0.0–0.7)
EOS PCT: 3 %
HCT: 29.1 % — ABNORMAL LOW (ref 39.0–52.0)
HEMOGLOBIN: 9.4 g/dL — AB (ref 13.0–17.0)
LYMPHS ABS: 1.2 10*3/uL (ref 0.7–4.0)
Lymphocytes Relative: 20 %
MCH: 30.6 pg (ref 26.0–34.0)
MCHC: 32.3 g/dL (ref 30.0–36.0)
MCV: 94.8 fL (ref 78.0–100.0)
MONOS PCT: 12 %
Monocytes Absolute: 0.7 10*3/uL (ref 0.1–1.0)
NEUTROS PCT: 65 %
Neutro Abs: 4 10*3/uL (ref 1.7–7.7)
Platelets: 103 10*3/uL — ABNORMAL LOW (ref 150–400)
RBC: 3.07 MIL/uL — AB (ref 4.22–5.81)
RDW: 14.9 % (ref 11.5–15.5)
WBC: 6.1 10*3/uL (ref 4.0–10.5)

## 2017-05-22 LAB — BASIC METABOLIC PANEL
ANION GAP: 4 — AB (ref 5–15)
Anion gap: 7 (ref 5–15)
BUN: 30 mg/dL — AB (ref 6–20)
BUN: 29 mg/dL — ABNORMAL HIGH (ref 6–20)
CALCIUM: 8.4 mg/dL — AB (ref 8.9–10.3)
CHLORIDE: 115 mmol/L — AB (ref 101–111)
CO2: 17 mmol/L — AB (ref 22–32)
CO2: 23 mmol/L (ref 22–32)
CREATININE: 0.65 mg/dL (ref 0.61–1.24)
Calcium: 8.3 mg/dL — ABNORMAL LOW (ref 8.9–10.3)
Chloride: 116 mmol/L — ABNORMAL HIGH (ref 101–111)
Creatinine, Ser: 0.66 mg/dL (ref 0.61–1.24)
GFR calc Af Amer: >60 mL/min (ref >60)
GFR calc non Af Amer: >60 mL/min (ref >60)
GLUCOSE: 112 mg/dL — AB (ref 65–99)
Glucose, Bld: 126 mg/dL — ABNORMAL HIGH (ref 65–99)
POTASSIUM: 3.6 mmol/L (ref 3.5–5.1)
Potassium: 3 mmol/L — ABNORMAL LOW (ref 3.5–5.1)
SODIUM: 143 mmol/L (ref 135–145)
Sodium: 139 mmol/L (ref 135–145)

## 2017-05-22 LAB — CULTURE, BLOOD (ROUTINE X 2): SPECIAL REQUESTS: ADEQUATE

## 2017-05-22 LAB — GLUCOSE, CAPILLARY
GLUCOSE-CAPILLARY: 112 mg/dL — AB (ref 65–99)
GLUCOSE-CAPILLARY: 113 mg/dL — AB (ref 65–99)
GLUCOSE-CAPILLARY: 113 mg/dL — AB (ref 65–99)
Glucose-Capillary: 103 mg/dL — ABNORMAL HIGH (ref 65–99)
Glucose-Capillary: 110 mg/dL — ABNORMAL HIGH (ref 65–99)

## 2017-05-22 LAB — MAGNESIUM: Magnesium: 1.9 mg/dL (ref 1.7–2.4)

## 2017-05-22 MED ORDER — SENNOSIDES 8.8 MG/5ML PO SYRP
5.0000 mL | ORAL_SOLUTION | Freq: Two times a day (BID) | ORAL | Status: DC
  Filled 2017-05-22 (×3): qty 5

## 2017-05-22 MED ORDER — DEXTROSE 5 % IV SOLN
2.0000 g | INTRAVENOUS | Status: DC
  Administered 2017-05-22 – 2017-05-26 (×5): 2 g via INTRAVENOUS
  Filled 2017-05-22 (×7): qty 2

## 2017-05-22 MED ORDER — DOCUSATE SODIUM 50 MG/5ML PO LIQD
100.0000 mg | Freq: Two times a day (BID) | ORAL | Status: DC
  Administered 2017-05-25 – 2017-05-27 (×4): 100 mg
  Filled 2017-05-22 (×10): qty 10

## 2017-05-22 MED ORDER — POTASSIUM CHLORIDE 20 MEQ/15ML (10%) PO SOLN
20.0000 meq | Freq: Once | ORAL | Status: AC
  Administered 2017-05-22: 20 meq
  Filled 2017-05-22: qty 15

## 2017-05-22 MED ORDER — POTASSIUM CHLORIDE 20 MEQ/15ML (10%) PO SOLN
40.0000 meq | Freq: Once | ORAL | Status: AC
  Administered 2017-05-22: 40 meq
  Filled 2017-05-22: qty 30

## 2017-05-22 NOTE — Progress Notes (Signed)
eLink Physician-Brief Progress Note Patient Name: Alexander [name] DOB: [birth year] MRN: [id number]   Date of Service  05/22/2017  HPI/Events of Note  K+ = 3.0 and Creatinine = 0.65.  eICU Interventions  Will replace K+.      Intervention Category Major Interventions: Electrolyte abnormality - evaluation and management  Sommer,Steven Eugene 05/22/2017, 5:42 AM

## 2017-05-22 NOTE — Progress Notes (Signed)
PULMONARY / CRITICAL CARE MEDICINE   Name: Alexander [name] MRN: [id number] DOB: June 24, 1956    ADMISSION DATE:  05/19/2017  REFERRING MD: Dr. Rosalia Hammersay, ER  CHIEF COMPLAINT:  Low blood pressure  HISTORY OF PRESENT ILLNESS:   61 yo male from NH with fever, low BP, and altered mental status.  Developed worsening respiratory status and required intubation. Hx of TBI and C1 fx, CVA, DM, HTN, PAD, Seizures, recurrent aspiration, neurogenic bladder, recurrent UTI, Depression, non verbal.  SUBJECTIVE: Attempting weaning.  Does not appear to be any barriers to have been successfully weaned from mechanical ventilatory support    VITAL SIGNS: BP 126/75   Pulse 64   Temp 98.6 F (37 C) (Oral)   Resp (!) 25   Ht 6\' 1"  (1.854 m)   Wt 174 lb 6.1 oz (79.1 kg)   SpO2 98%   BMI 23.01 kg/m   VENTILATOR SETTINGS: Vent Mode: PSV;CPAP FiO2 (%):  [40 %] 40 % Set Rate:  [22 bmp] 22 bmp Vt Set:  [640 mL] 640 mL PEEP:  [5 cmH20] 5 cmH20 Pressure Support:  [12 cmH20-20 cmH20] 12 cmH20 Plateau Pressure:  [21 cmH20-29 cmH20] 22 cmH20  INTAKE / OUTPUT: I/O last 3 completed shifts: In: 5333.8 [I.V.:1972.8; Other:230; NG/GT:1831.1; IV Piggyback:1300] Out: 2535 [Urine:2535]  PHYSICAL EXAMINATION:  General: Ill-appearing white male in no acute distress HEENT: MM pink/moist endotracheal tube in place PSY: Dull effect Neuro: Follows simple commands CV: Heart sounds are regular regular rate and rhythm PULM: even/non-lab decreased breath sounds in the bases WU:JWJXGI:soft, non-tender, bsx4 active  Extremities: warm/dry, 1+ edema  Skin: Warm dry    LABS:  BMET Recent Labs  Lab 05/21/17 0355 05/21/17 1258 05/22/17 0357  NA 137 140 139  K 2.5* 3.7 3.0*  CL 113* 115* 115*  CO2 18* 19* 17*  BUN 32* 34* 30*  CREATININE 0.70 0.73 0.65  GLUCOSE 136* 143* 126*    Electrolytes Recent Labs  Lab 05/20/17 1710  05/21/17 0355 05/21/17 1258 05/21/17 1646 05/22/17 0357  CALCIUM  --    < > 8.3* 8.1*   --  8.3*  MG 1.9  --  2.0  --  1.9 1.9  PHOS 3.0  --  2.1*  --  4.2  --    < > = values in this interval not displayed.    CBC Recent Labs  Lab 05/21/17 0355 05/21/17 1646 05/22/17 0357  WBC 4.1 3.5* 6.1  HGB 8.8* 9.4* 9.4*  HCT 26.6* 29.1* 29.1*  PLT 98* 86* 103*    Coag's Recent Labs  Lab 05/19/17 0724  INR 1.21    Sepsis Markers Recent Labs  Lab 05/19/17 1032 05/19/17 1516 05/19/17 1916  LATICACIDVEN 1.00 2.9* 2.7*  PROCALCITON  --  0.52  --     ABG Recent Labs  Lab 05/19/17 0813 05/19/17 1300 05/20/17 0330  PHART 7.266* 7.406 7.524*  PCO2ART 55.3* 33.9 23.9*  PO2ART 98.0 202* 264*    Liver Enzymes Recent Labs  Lab 05/19/17 0724  AST 33  ALT 32  ALKPHOS 105  BILITOT 0.4  ALBUMIN 3.3*    Cardiac Enzymes Recent Labs  Lab 05/19/17 1516 05/19/17 1916 05/20/17 0255  TROPONINI 0.06* <0.03 <0.03    Glucose Recent Labs  Lab 05/21/17 1205 05/21/17 1546 05/21/17 2014 05/21/17 2332 05/22/17 0411 05/22/17 0835  GLUCAP 128* 124* 101* 118* 112* 103*    Imaging Dg Chest Port 1 View  Result Date: 05/22/2017 CLINICAL DATA:  Respiratory failure EXAM: PORTABLE  CHEST 1 VIEW COMPARISON:  Yesterday FINDINGS: Endotracheal tube tip between the clavicular heads and carina. Low volume chest with hazy opacities at the bases, improved from prior with better visualized diaphragm. No effusion or pneumothorax. Grossly stable mediastinal contours allowing for rightward rotation. IMPRESSION: 1. Atelectasis or pneumonia at the bases of the low volume lungs. Aeration is improved from yesterday. 2. Stable endotracheal tube position. Electronically Signed   By: Marnee SpringJonathon  Watts M.D.   On: 05/22/2017 08:30     STUDIES:  CXR 11/19 > small b/l effusions, possible PNA Echo 11/17 > EF of 55-65% normal LV function  CULTURES: Blood 11/17 >> GNRs > 1 of 2 blood cultures positive for providenciaStuarti Urine 11/17 >> insignificant growth Sputum 11/17 >> normal  respiratory flora Legionella 11/17 >>  Pneumococcal 11/17 >> negative  ANTIBIOTICS: Vancomycin 11/17 >> off Meropenem 11/17 >>   SIGNIFICANT EVENTS: 11/17 Admit  LINES/TUBES: 11/17 ETT >>   DISCUSSION: 61 yo with septic shock, VDRF from recurrent aspiration pneumonia, UTI, and pressure wounds.  Shock is resolved.  No evidence of ever having a tracheostomy.  He may be weanable in the near future.  ASSESSMENT / PLAN:  Acute respiratory failure with hypoxia. - continue PS wean for now, wean down PS (currently at 12/5) - f/u CXR  Septic shock from Aspiration PNA, UTI, pressure wounds - was on pressors but turned off 11/18.  1 of 2 blood cultures positive gram-negative rods - continue fluids, abx - wound care - f/u Echo was normal with EF of 55-60%  Acute metabolic encephalopathy. Hx of TBI, CVA, C 1 fx, seizures. - Sedation: Precedex gtt / fentanyl PRN / midazolam PRN - RASS goal 0 to -1 - Daily WUA  Dysphagia s/p PEG. - tube feeds  Anemia of critical illness and chronic disease. Recent Labs    05/21/17 1646 05/22/17 0357  HGB 9.4* 9.4*    Thrombocytopenia. Improved 11/20 - f/u CBC  DM type II. CBG (last 3)  Recent Labs    05/21/17 2332 05/22/17 0411 05/22/17 0835  GLUCAP 118* 112* 103*    - SSI  Hypokalemia, hypophosphatemia - s/p potassium repletion AM 11/19. Recent Labs  Lab 05/21/17 0355 05/21/17 1258 05/22/17 0357  K 2.5* 3.7 3.0*    -replete K - check bmet 1500  DVT prophylaxis - SQ heparin SUP - protonix Nutrition - tube feeds Goals of care - full code  CC time: 30 min.   Rutherford Guysahul Desai, [place]PA - C [facility] Pager: [phone]  or [phone] 05/22/2017, 9:33 AM

## 2017-05-22 NOTE — Progress Notes (Signed)
Pharmacy Antibiotic Note  Alexander [name] is a 61 y.o. male with a PMH of CVA, Depression, Gout, HLD, HTN, Seizure. Patient was admitted on 05/19/2017 with bacteremia. Patient's blood culture came back Providencia Stuartii, susceptible to Ceftriaxone, Cefepime, and Ceftazidime. Plan to initiate Ceftriaxone, as it is the most narrow antibiotic. Plan to initiate it at a higher dose, as the patient does have a more serious infection of bacteremia. Pharmacy has been consulted for Ceftriaxone dosing.  Assessment: Patient has continued to remain afebrile, WBC count has been trending up but is normal, CXR shows improved aeration, SCr remains stable and normal, and has had good urine output.   Plan: Initiate Ceftriaxone 2g Q24H  Monitor Temperature, WBC, CXR, SCr, Urine Output, Length of therapy  Height: 6\' 1"  (185.4 cm) Weight: 174 lb 6.1 oz (79.1 kg) IBW/kg (Calculated) : 79.9  Temp (24hrs), Avg:99.1 F (37.3 C), Min:97.7 F (36.5 C), Max:100 F (37.8 C)  Recent Labs  Lab 05/19/17 0724 05/19/17 0727 05/19/17 1032 05/19/17 1516 05/19/17 1916 05/20/17 0255 05/20/17 2242 05/21/17 0355 05/21/17 1258 05/21/17 1646 05/22/17 0357 05/22/17 1241  WBC 13.7*  --   --   --   --  8.1  --  4.1  --  3.5* 6.1  --   CREATININE 1.21  --   --   --   --  0.94 0.83 0.70 0.73  --  0.65 0.66  LATICACIDVEN  --  1.66 1.00 2.9* 2.7*  --   --   --   --   --   --   --     Estimated Creatinine Clearance: 111.2 mL/min (by C-G formula based on SCr of 0.66 mg/dL).    Allergies  Allergen Reactions  . Amoxicillin Itching, Rash and Other (See Comments)    NOTED ON MAR Has patient had a PCN reaction causing immediate rash, facial/tongue/throat swelling, SOB or lightheadedness with hypotension: Unknown Has patient had a PCN reaction causing severe rash involving mucus membranes or skin necrosis: Unknown Has patient had a PCN reaction that required hospitalization Unknown Has patient had a PCN reaction occurring  within the last 10 years: Unknown If all of the above answers are "NO", then may proceed with Cephalosporin use.     Antimicrobials this admission: 11/17 Meropenem >> 11/20 11/17 Vancomycin >> 11/20 11/20 Ceftriaxone >>   Dose adjustments this admission: N/A  Microbiology results: 11/20 BCx 1/2: Providencia Stuartii (R: Ampicillin, Cefazolin, Ciprofloxacin, Gentamicin, Trimeth/Sulfa, Amp/Sul) 11/8 UCx: <10,000 colonies, insignificant growth  11/19 Tracheal aspirate Cx: Normal respiratory flora 11/17 MRSA PCR: Positive  Thank you for allowing pharmacy to be a part of this patient's care.  Kayleen MemosSonya L Anderson  PharmD Candidate 05/22/2017 2:42 PM   I discussed / reviewed the pharmacy note by Ms. Anderson and I agree with the resident's findings and plans as documented.  Link SnufferJessica Lalani Winkles, PharmD, BCPS, BCCCP Clinical Pharmacist Clinical phone 05/22/2017 until 3:30PM [phone]- [phone] After hours, please call [phone] 05/22/2017, .now

## 2017-05-23 ENCOUNTER — Inpatient Hospital Stay (HOSPITAL_COMMUNITY): Payer: Medicare Other

## 2017-05-23 LAB — CBC WITH DIFFERENTIAL/PLATELET
BASOS ABS: 0 10*3/uL (ref 0.0–0.1)
Basophils Relative: 0 %
Eosinophils Absolute: 0.2 10*3/uL (ref 0.0–0.7)
Eosinophils Relative: 3 %
HEMATOCRIT: 29.8 % — AB (ref 39.0–52.0)
Hemoglobin: 9.7 g/dL — ABNORMAL LOW (ref 13.0–17.0)
LYMPHS ABS: 1.4 10*3/uL (ref 0.7–4.0)
LYMPHS PCT: 20 %
MCH: 31.4 pg (ref 26.0–34.0)
MCHC: 32.6 g/dL (ref 30.0–36.0)
MCV: 96.4 fL (ref 78.0–100.0)
MONO ABS: 0.5 10*3/uL (ref 0.1–1.0)
MONOS PCT: 7 %
NEUTROS ABS: 4.7 10*3/uL (ref 1.7–7.7)
Neutrophils Relative %: 70 %
Platelets: 115 10*3/uL — ABNORMAL LOW (ref 150–400)
RBC: 3.09 MIL/uL — ABNORMAL LOW (ref 4.22–5.81)
RDW: 15.3 % (ref 11.5–15.5)
WBC: 6.8 10*3/uL (ref 4.0–10.5)

## 2017-05-23 LAB — GLUCOSE, CAPILLARY
GLUCOSE-CAPILLARY: 105 mg/dL — AB (ref 65–99)
GLUCOSE-CAPILLARY: 109 mg/dL — AB (ref 65–99)
Glucose-Capillary: 110 mg/dL — ABNORMAL HIGH (ref 65–99)
Glucose-Capillary: 92 mg/dL (ref 65–99)
Glucose-Capillary: 87 mg/dL (ref 65–99)

## 2017-05-23 LAB — RENAL FUNCTION PANEL
Albumin: 2.4 g/dL — ABNORMAL LOW (ref 3.5–5.0)
Anion gap: 8 (ref 5–15)
BUN: 28 mg/dL — AB (ref 6–20)
CHLORIDE: 116 mmol/L — AB (ref 101–111)
CO2: 16 mmol/L — ABNORMAL LOW (ref 22–32)
Calcium: 8.4 mg/dL — ABNORMAL LOW (ref 8.9–10.3)
Creatinine, Ser: 0.59 mg/dL — ABNORMAL LOW (ref 0.61–1.24)
Glucose, Bld: 118 mg/dL — ABNORMAL HIGH (ref 65–99)
POTASSIUM: 3.2 mmol/L — AB (ref 3.5–5.1)
Phosphorus: 2.2 mg/dL — ABNORMAL LOW (ref 2.5–4.6)
Sodium: 140 mmol/L (ref 135–145)

## 2017-05-23 LAB — MAGNESIUM: MAGNESIUM: 1.9 mg/dL (ref 1.7–2.4)

## 2017-05-23 MED ORDER — SENNOSIDES 8.8 MG/5ML PO SYRP
5.0000 mL | ORAL_SOLUTION | Freq: Every day | ORAL | Status: DC
  Administered 2017-05-25 – 2017-05-26 (×2): 5 mL
  Filled 2017-05-23 (×4): qty 5

## 2017-05-23 MED ORDER — ORAL CARE MOUTH RINSE
15.0000 mL | Freq: Two times a day (BID) | OROMUCOSAL | Status: DC
  Administered 2017-05-24 – 2017-05-27 (×7): 15 mL via OROMUCOSAL

## 2017-05-23 MED ORDER — POTASSIUM CHLORIDE 20 MEQ/15ML (10%) PO SOLN
80.0000 meq | Freq: Once | ORAL | Status: AC
  Administered 2017-05-23: 80 meq
  Filled 2017-05-23: qty 60

## 2017-05-23 MED ORDER — FENTANYL CITRATE (PF) 100 MCG/2ML IJ SOLN
INTRAMUSCULAR | Status: AC
  Filled 2017-05-23: qty 2

## 2017-05-23 MED ORDER — FUROSEMIDE 10 MG/ML IJ SOLN
40.0000 mg | Freq: Once | INTRAMUSCULAR | Status: AC
  Administered 2017-05-23: 40 mg via INTRAVENOUS
  Filled 2017-05-23: qty 4

## 2017-05-23 MED ORDER — MIDAZOLAM HCL 2 MG/2ML IJ SOLN
INTRAMUSCULAR | Status: AC
  Filled 2017-05-23: qty 2

## 2017-05-23 MED ORDER — POTASSIUM PHOSPHATES 15 MMOLE/5ML IV SOLN
30.0000 mmol | Freq: Once | INTRAVENOUS | Status: AC
  Administered 2017-05-23: 30 mmol via INTRAVENOUS
  Filled 2017-05-23: qty 10

## 2017-05-23 NOTE — Progress Notes (Signed)
Per MD, pt placed back on 5 psv and RN give lasix.

## 2017-05-23 NOTE — Progress Notes (Signed)
PCCM Attending Re-Rounding Note:  Patient re-evaluated at bedside. -1L following Lasix this morning. Patient transitioned to SBT with PS 0 and PEEP 5 with FiO2 0.4 at 4pm today. RR about 25 with cuff leak on my evaluation. Hemodynamically stable. Noted difficult airway with decreased neck flexion & extension. Prepping for extubation with materials & supplies at bedside for extubation.   Donna ChristenJennings E. Jamison NeighborNestor, M.D. [facility] Pager:  [phone] After 7pm or if no response, call [phone] 4:58 PM 05/23/17

## 2017-05-23 NOTE — Procedures (Signed)
Extubation Procedure Note  Patient Details:   Name: Alexander [name] DOB: [birth year] MRN: [id number]   Airway Documentation:  Airway 7.5 mm (Active)  Secured at (cm) 24 cm 05/23/2017  3:59 PM  Measured From Lips 05/23/2017  3:59 PM  Secured Location Right 05/23/2017  3:59 PM  Secured By [employer] Tube Holder 05/23/2017  3:59 PM  Tube Holder Repositioned Yes 05/23/2017  3:59 PM  Cuff Pressure (cm H2O) 26 cm H2O 05/23/2017  8:15 AM  Site Condition Dry 05/23/2017  3:59 PM    Evaluation  O2 sats: stable throughout Complications: No apparent complications Patient did tolerate procedure well. Bilateral Breath Sounds: Clear, Diminished   No Patient non verbal at baseline. Patient extubated to 4lnc. Vital signs stable at this time. No complications. RN at bedside. RT will continue to monitor.  [name], [name] 05/23/2017, 5:19 PM

## 2017-05-23 NOTE — Progress Notes (Signed)
Per MD, pt changed to 0ps, 5 peep.  RN aware

## 2017-05-23 NOTE — Progress Notes (Signed)
PULMONARY / CRITICAL CARE MEDICINE   Name: Alexander FreudMartin A Salek MRN: [id number] DOB: 1955/07/07    ADMISSION DATE:  05/19/2017  REFERRING MD: Dr. Rosalia Hammersay, ER  CHIEF COMPLAINT:  Low blood pressure  HISTORY OF PRESENT ILLNESS:   61 yo male from NH with fever, low BP, and altered mental status.  Developed worsening respiratory status and required intubation. Hx of TBI and C1 fx, CVA, DM, HTN, PAD, Seizures, recurrent aspiration, neurogenic bladder, recurrent UTI, Depression, non verbal.  SUBJECTIVE: Adult male, awake, following commands.  VITAL SIGNS: BP 121/80 (BP Location: Right Arm)   Pulse (!) 48   Temp (!) 97.4 F (36.3 C) (Oral)   Resp (!) 23   Ht 6\' 1"  (1.854 m)   Wt 78.6 kg (173 lb 4.5 oz)   SpO2 99%   BMI 22.86 kg/m   VENTILATOR SETTINGS: Vent Mode: PRVC FiO2 (%):  [30 %-40 %] 40 % Set Rate:  [22 bmp] 22 bmp Vt Set:  [640 mL] 640 mL PEEP:  [5 cmH20] 5 cmH20 Pressure Support:  [10 cmH20] 10 cmH20 Plateau Pressure:  [20 cmH20-25 cmH20] 20 cmH20  INTAKE / OUTPUT: I/O last 3 completed shifts: In: 3601 [I.V.:751; Other:160; NG/GT:2340; IV Piggyback:350] Out: 3225 [Urine:3225]  PHYSICAL EXAMINATION: General: Ill-appearing white male in no acute distress HEENT: MM pink/moist endotracheal tube in place Neuro: Follows simple commands CV: Heart sounds are regular regular rate and rhythm PULM: even/non-lab decreased breath sounds in the bases WU:JWJXGI:soft, non-tender, bsx4 active Extremities: warm/dry, edema  Skin: Warm dry   LABS:  BMET Recent Labs  Lab 05/22/17 0357 05/22/17 1241 05/23/17 0509  NA 139 143 140  K 3.0* 3.6 3.2*  CL 115* 116* 116*  CO2 17* 23 16*  BUN 30* 29* 28*  CREATININE 0.65 0.66 0.59*  GLUCOSE 126* 112* 118*    Electrolytes Recent Labs  Lab 05/21/17 0355  05/21/17 1646 05/22/17 0357 05/22/17 1241 05/23/17 0509  CALCIUM 8.3*   < >  --  8.3* 8.4* 8.4*  MG 2.0  --  1.9 1.9  --  1.9  PHOS 2.1*  --  4.2  --   --  2.2*   < > = values in  this interval not displayed.    CBC Recent Labs  Lab 05/21/17 1646 05/22/17 0357 05/23/17 0509  WBC 3.5* 6.1 6.8  HGB 9.4* 9.4* 9.7*  HCT 29.1* 29.1* 29.8*  PLT 86* 103* 115*    Coag's Recent Labs  Lab 05/19/17 0724  INR 1.21    Sepsis Markers Recent Labs  Lab 05/19/17 1032 05/19/17 1516 05/19/17 1916  LATICACIDVEN 1.00 2.9* 2.7*  PROCALCITON  --  0.52  --     ABG Recent Labs  Lab 05/19/17 0813 05/19/17 1300 05/20/17 0330  PHART 7.266* 7.406 7.524*  PCO2ART 55.3* 33.9 23.9*  PO2ART 98.0 202* 264*    Liver Enzymes Recent Labs  Lab 05/19/17 0724 05/23/17 0509  AST 33  --   ALT 32  --   ALKPHOS 105  --   BILITOT 0.4  --   ALBUMIN 3.3* 2.4*    Cardiac Enzymes Recent Labs  Lab 05/19/17 1516 05/19/17 1916 05/20/17 0255  TROPONINI 0.06* <0.03 <0.03    Glucose Recent Labs  Lab 05/22/17 0411 05/22/17 0835 05/22/17 1239 05/22/17 1535 05/22/17 1931 05/23/17 0020  GLUCAP 112* 103* 113* 113* 110* 109*    Imaging Dg Chest Port 1 View  Result Date: 05/23/2017 CLINICAL DATA:  Hypoxia EXAM: PORTABLE CHEST 1 VIEW COMPARISON:  May 22, 2017 FINDINGS: Endotracheal tube tip is 3.4 cm above the carina. No pneumothorax. There are small pleural effusions bilaterally with mild bibasilar atelectasis. There is no frank edema or consolidation. Heart is upper normal in size with pulmonary vascularity within normal limits. No adenopathy. There is aortic atherosclerosis. There is an old healed fracture of the anterior left second rib. There is postoperative change in the region of the coracoid process of the left scapula. IMPRESSION: Small pleural effusions with mild bibasilar atelectasis. No airspace consolidation. Stable cardiac silhouette. There is aortic atherosclerosis. Endotracheal tube as described without evident pneumothorax. Aortic Atherosclerosis (ICD10-I70.0). Electronically Signed   By: Bretta BangWilliam  Woodruff III M.D.   On: 05/23/2017 07:33      STUDIES:  CXR 11/19 > small b/l effusions, possible PNA Echo 11/17 > EF of 55-65% normal LV function  CULTURES: Blood 11/17 >> GNRs > 1 of 2 blood cultures positive for providenciaStuarti Urine 11/17 >> insignificant growth Sputum 11/17 >> normal respiratory flora Legionella 11/17 >>  Pneumococcal 11/17 >> negative  ANTIBIOTICS: Vancomycin 11/17 >> off Meropenem 11/17 >>   SIGNIFICANT EVENTS: 11/17 Admit  LINES/TUBES: 11/17 ETT >>   DISCUSSION: 61 yo with septic shock, VDRF from recurrent aspiration pneumonia, UTI, and pressure wounds.  Shock is resolved.  No evidence of ever having a tracheostomy.  He may be weanable in the near future.  ASSESSMENT / PLAN:  Acute respiratory failure with hypoxia. - continue PS wean for now, wean down PS (currently at 12/5), will attempt to wean further - hopefully can extubate today - follow CXR  Septic shock from Aspiration PNA, UTI, pressure wounds - was on pressors but turned off 11/18.  1 of 2 blood cultures positive gram-negative rods.  F/u Echo was normal with EF of 55-60% - continue abx - wound care  Acute metabolic encephalopathy. Hx of TBI, CVA, C 1 fx, seizures. - Sedation: Precedex gtt / fentanyl PRN / midazolam PRN - wean down to help facilitate extubation - RASS goal 0 to -1 - Daily WUA - Continue preadmission buspar, oxcarbazepine, topiramate, venlafaxine, trazodone  Dysphagia s/p PEG. - tube feeds  Anemia of critical illness and chronic disease. Thrombocytopenia. Improved 11/20. - f/u CBC  DM type II. - SSI  Hypokalemia, hypophosphatemia - s/p potassium repletion AM 11/21. Hypophosphatemia Volume overload - +5 L since admit -replete K and phos - lasix 40mg  now  DVT prophylaxis - SQ heparin SUP - protonix Nutrition - tube feeds Goals of care - full code  CC time: 30 min.   Rutherford Guysahul Toiya Morrish, [place] - [facility] Pager: [phone]  or [phone] 05/23/2017,  7:56 AM

## 2017-05-24 ENCOUNTER — Inpatient Hospital Stay (HOSPITAL_COMMUNITY): Payer: Medicare Other

## 2017-05-24 LAB — CBC WITH DIFFERENTIAL/PLATELET
Basophils Absolute: 0 10*3/uL (ref 0.0–0.1)
Basophils Relative: 0 %
EOS PCT: 2 %
Eosinophils Absolute: 0.2 10*3/uL (ref 0.0–0.7)
HEMATOCRIT: 31.3 % — AB (ref 39.0–52.0)
HEMOGLOBIN: 10 g/dL — AB (ref 13.0–17.0)
LYMPHS ABS: 1.7 10*3/uL (ref 0.7–4.0)
LYMPHS PCT: 18 %
MCH: 31.1 pg (ref 26.0–34.0)
MCHC: 31.9 g/dL (ref 30.0–36.0)
MCV: 97.2 fL (ref 78.0–100.0)
Monocytes Absolute: 0.9 10*3/uL (ref 0.1–1.0)
Monocytes Relative: 9 %
NEUTROS PCT: 71 %
Neutro Abs: 6.5 10*3/uL (ref 1.7–7.7)
Platelets: 149 10*3/uL — ABNORMAL LOW (ref 150–400)
RBC: 3.22 MIL/uL — AB (ref 4.22–5.81)
RDW: 14.8 % (ref 11.5–15.5)
WBC: 9.3 10*3/uL (ref 4.0–10.5)

## 2017-05-24 LAB — GLUCOSE, CAPILLARY
GLUCOSE-CAPILLARY: 88 mg/dL (ref 65–99)
GLUCOSE-CAPILLARY: 104 mg/dL — AB (ref 65–99)
Glucose-Capillary: 105 mg/dL — ABNORMAL HIGH (ref 65–99)
Glucose-Capillary: 106 mg/dL — ABNORMAL HIGH (ref 65–99)
Glucose-Capillary: 87 mg/dL (ref 65–99)
Glucose-Capillary: 91 mg/dL (ref 65–99)
Glucose-Capillary: 99 mg/dL (ref 65–99)

## 2017-05-24 LAB — RENAL FUNCTION PANEL
ANION GAP: 5 (ref 5–15)
Albumin: 2.4 g/dL — ABNORMAL LOW (ref 3.5–5.0)
BUN: 29 mg/dL — ABNORMAL HIGH (ref 6–20)
CHLORIDE: 115 mmol/L — AB (ref 101–111)
CO2: 23 mmol/L (ref 22–32)
Calcium: 8.5 mg/dL — ABNORMAL LOW (ref 8.9–10.3)
Creatinine, Ser: 0.91 mg/dL (ref 0.61–1.24)
GFR calc non Af Amer: >60 mL/min (ref >60)
Glucose, Bld: 89 mg/dL (ref 65–99)
Phosphorus: 3.9 mg/dL (ref 2.5–4.6)
Potassium: 3.9 mmol/L (ref 3.5–5.1)
Sodium: 143 mmol/L (ref 135–145)

## 2017-05-24 LAB — CULTURE, BLOOD (ROUTINE X 2)
CULTURE: NO GROWTH
Special Requests: ADEQUATE

## 2017-05-24 LAB — MAGNESIUM: Magnesium: 2 mg/dL (ref 1.7–2.4)

## 2017-05-24 LAB — PHOSPHORUS: PHOSPHORUS: 4 mg/dL (ref 2.5–4.6)

## 2017-05-24 MED ORDER — LACOSAMIDE 50 MG PO TABS
100.0000 mg | ORAL_TABLET | Freq: Two times a day (BID) | ORAL | Status: DC
  Administered 2017-05-25 – 2017-05-27 (×6): 100 mg
  Filled 2017-05-24 (×6): qty 2

## 2017-05-24 MED ORDER — FINASTERIDE 5 MG PO TABS
5.0000 mg | ORAL_TABLET | Freq: Every day | ORAL | Status: DC
  Administered 2017-05-24 – 2017-05-27 (×4): 5 mg via ORAL
  Filled 2017-05-24 (×4): qty 1

## 2017-05-24 NOTE — Progress Notes (Addendum)
[facility] Attending Note  ADMISSION DATE:05/19/2017  REFERRING DJ:[id number] Ray, M.D. / EDP  CHIEF COMPLAINT:Septic Shock  Presenting HPI:  61 y.o. male with history of traumatic brain injury and C1 fracture  Presenting from nursing facility with fever, altered mentation, and shock consistent with sepsis. Patient required endotracheal intubation for worsening respiratory failure. Known history of seizures as well as recurrent aspiration and recurrent urinary tract infection. Patient is nonverbal at baseline.  Subjective:  Patient extubated yesterday. Patient denies any pain or difficulty breathing at this time with hand signals. Denies any nausea at this time with and signal.  Review of Systems:  Unable to obtain as patient is currently nonverbal at baseline.   Temp:  [97.9 F (36.6 C)-99.7 F (37.6 C)] 98.9 F (37.2 C) (11/22 0423) Pulse Rate:  [59-95] 88 (11/22 0600) Resp:  [17-39] 24 (11/22 0600) BP: (113-144)/(69-96) 134/83 (11/22 0600) SpO2:  [98 %-100 %] 99 % (11/22 0600) FiO2 (%):  [40 %] 40 % (11/21 1559) Weight:  [176 lb 2.4 oz (79.9 kg)] 176 lb 2.4 oz (79.9 kg) (11/22 0438)   General:  No family at bedside. Awake. No distress. Integument:  Warm and dry. No rash appreciated. HEENT:  Moist mucus memebranes. No scleral icterus.  Neurological:  Able to give thumbs up. Following commands. Nonverbal. Pulmonary:  Coarse breath sounds bilaterally but good aeration. Good cough. Normal work of breathing on nasal cannula. Cardiovascular:  Regular rate. No appreciable JVD. Abdomen:  Soft. Nondistended. Normoactive bowel sounds.  LINES/TUBES: OETT 11/17 - 11/21 Foley >>> PEG >>> PIV  CBC Latest Ref Rng & Units 05/24/2017 05/23/2017 05/22/2017  WBC 4.0 - 10.5 K/uL 9.3 6.8 6.1  Hemoglobin 13.0 - 17.0 g/dL 10.0(L) 9.7(L) 9.4(L)  Hematocrit 39.0 - 52.0 % 31.3(L) 29.8(L) 29.1(L)  Platelets 150 - 400 K/uL 149(L) 115(L) 103(L)    BMP Latest Ref Rng &  Units 05/24/2017 05/23/2017 05/22/2017  Glucose 65 - 99 mg/dL 89 118(H) 112(H)  BUN 6 - 20 mg/dL 29(H) 28(H) 29(H)  Creatinine 0.61 - 1.24 mg/dL 0.91 0.59(L) 0.66  BUN/Creat Ratio 9 - 20 - - -  Sodium 135 - 145 mmol/L 143 140 143  Potassium 3.5 - 5.1 mmol/L 3.9 3.2(L) 3.6  Chloride 101 - 111 mmol/L 115(H) 116(H) 116(H)  CO2 22 - 32 mmol/L 23 16(L) 23  Calcium 8.9 - 10.3 mg/dL 8.5(L) 8.4(L) 8.4(L)    Hepatic Function Latest Ref Rng & Units 05/24/2017 05/23/2017 05/19/2017  Total Protein 6.5 - 8.1 g/dL - - 6.8  Albumin 3.5 - 5.0 g/dL 2.4(L) 2.4(L) 3.3(L)  AST 15 - 41 U/L - - 33  ALT 17 - 63 U/L - - 32  Alk Phosphatase 38 - 126 U/L - - 105  Total Bilirubin 0.3 - 1.2 mg/dL - - 0.4    IMAGING/STUDIES: PORT CXR 11/19:  Previously reviewed by me. Endotracheal tube and enteric feeding tube in good position. Questionable small bilateral pleural effusions. PORT CXR 11/21:  Previously reviewed by me. Questionable small bilateral pleural effusions versus atelectasis. Endotracheal tube in good position. No new focal opacity. PORT CXR 11/22:  Personally reviewed by me. Removal of endotracheal tube since last imaging. Minimal silhouetting of lateral right hemidiaphragm suggestive of atelectasis. Significant improvement in left basilar opacity. No new focal opacity appreciated.  MICROBIOLOGY: MRSA PCR 11/17:  Positive Blood Cultures x2 11/17:  1/2 Bottles Positive Provedencia stuartii (sensitive to Rocephin & Imipenem) Urine Culture 11/17:  Insignificant growth Tracheal Aspirate Culture 11/17:  Normal  oral flora  Urine Streptococcal Antigen 11/17:  Negative  Urine Legionella Antigen 11/17:  Negative  Blood Cultures x2 11/20 >>>  ANTIBIOTICS: Vancomycin 11/17 - 11/19 Merrem 11/17 - 11/20 Rocephin 11/20 >>>  SIGNIFICANT EVENTS: 11/17 - Admit 11/18 - Off pressors  11/21 - Tolerating PS 0/5 in AM w/ tachypnea and decompensation after about 30 minutes >> diuresing w/  Lasix.  ASSESSMENT/PLAN:  61 y.o. male admitted with septic shock secondary to bacteremia. Shock has resolved. Patient extubated yesterday & clinically improving. Tolerating tube feedings. Mental status seems to be intact.  1. Septic shock: Shock has resolved. Awaiting repeat blood cultures. Continuing treatment of bacteremia as follows. 2. Providencia bacteremia: Repeat blood cultures performed on 11/19. Continuing Rocephin given current sensitivities. 3. Acute hypoxic respiratory failure: Improving. Likely aspiration pneumonia. Weaning FiO2. Continuous pulse oximetry monitoring. Consider spirometry for pulmonary toilette.  4.  acute encephalopathy: Seemingly resolved. Reportedly nonverbal at baseline. Following commands. Discontinuing Precedex, fentanyl IV, and Versed IV. 5. Hypokalemia/hypophosphatemia: Resolved. Repeat electrolytes in a.m. 6. Anemia: No signs of active bleeding. Hemoglobin stable. Repeat CBC in a.m. 7. Thrombocytopenia: Steadily improving. Likely splenic sequestration setting of sepsis. Trending cell counts daily with CBC. 8. Chronic pain: Discontinuing fentanyl IV. No pain currently. Consider restarting fentanyl Duragesic patch chest pain returns. 9. Diabetes mellitus type 2: Continuing Accu-Cheks every 4 hours with sliding scale insulin per algorithm. 10. Seizure disorder: Continuing current antiepileptic regimen. Continuing seizure precautions.  Prophylaxis:  Protonix via tube daily, SCDs, & Heparin Sprague q8hr. Diet:  NPO. Tube Feedings via PEG. Code Status:  Full Code. Disposition:  Transferring patient to SDU. Family Update:  Patient updated during rounds.  I have spent a total of 36 minutes of time today caring for the patient, reviewing the patient's electronic medical record, and with more than 50% of that time spent coordinating transfer of care with the patient as well as reviewing the continuing plan of care with the patien at bedside.  TR[facility] to assume care & PCCM  off as of 11/23.  Sonia Baller Ashok Cordia, M.D. [facility] Pager:  [phone] After 7pm or if no response, call [phone] 7:30 AM 05/24/17

## 2017-05-25 DIAGNOSIS — Z9289 Personal history of other medical treatment: Secondary | ICD-10-CM

## 2017-05-25 DIAGNOSIS — E876 Hypokalemia: Secondary | ICD-10-CM

## 2017-05-25 DIAGNOSIS — R7881 Bacteremia: Secondary | ICD-10-CM

## 2017-05-25 LAB — MAGNESIUM: Magnesium: 2.2 mg/dL (ref 1.7–2.4)

## 2017-05-25 LAB — RENAL FUNCTION PANEL
ANION GAP: 5 (ref 5–15)
Albumin: 2.5 g/dL — ABNORMAL LOW (ref 3.5–5.0)
BUN: 31 mg/dL — ABNORMAL HIGH (ref 6–20)
CO2: 23 mmol/L (ref 22–32)
Calcium: 8.6 mg/dL — ABNORMAL LOW (ref 8.9–10.3)
Chloride: 115 mmol/L — ABNORMAL HIGH (ref 101–111)
Creatinine, Ser: 0.92 mg/dL (ref 0.61–1.24)
GFR calc Af Amer: >60 mL/min (ref >60)
GFR calc non Af Amer: >60 mL/min (ref >60)
GLUCOSE: 100 mg/dL — AB (ref 65–99)
POTASSIUM: 3.5 mmol/L (ref 3.5–5.1)
Phosphorus: 3.4 mg/dL (ref 2.5–4.6)
SODIUM: 143 mmol/L (ref 135–145)

## 2017-05-25 LAB — CBC WITH DIFFERENTIAL/PLATELET
BASOS PCT: 0 %
Basophils Absolute: 0 10*3/uL (ref 0.0–0.1)
Eosinophils Absolute: 0.3 10*3/uL (ref 0.0–0.7)
Eosinophils Relative: 3 %
HEMATOCRIT: 32.1 % — AB (ref 39.0–52.0)
Hemoglobin: 10.2 g/dL — ABNORMAL LOW (ref 13.0–17.0)
Lymphocytes Relative: 19 %
Lymphs Abs: 1.7 10*3/uL (ref 0.7–4.0)
MCH: 31.3 pg (ref 26.0–34.0)
MCHC: 31.8 g/dL (ref 30.0–36.0)
MCV: 98.5 fL (ref 78.0–100.0)
MONO ABS: 0.8 10*3/uL (ref 0.1–1.0)
MONOS PCT: 8 %
NEUTROS ABS: 6.3 10*3/uL (ref 1.7–7.7)
Neutrophils Relative %: 70 %
Platelets: 182 10*3/uL (ref 150–400)
RBC: 3.26 MIL/uL — ABNORMAL LOW (ref 4.22–5.81)
RDW: 14.9 % (ref 11.5–15.5)
WBC: 9 10*3/uL (ref 4.0–10.5)

## 2017-05-25 LAB — GLUCOSE, CAPILLARY
GLUCOSE-CAPILLARY: 107 mg/dL — AB (ref 65–99)
Glucose-Capillary: 84 mg/dL (ref 65–99)
Glucose-Capillary: 106 mg/dL — ABNORMAL HIGH (ref 65–99)
Glucose-Capillary: 97 mg/dL (ref 65–99)

## 2017-05-25 MED ORDER — SACCHAROMYCES BOULARDII 250 MG PO CAPS
250.0000 mg | ORAL_CAPSULE | Freq: Two times a day (BID) | ORAL | Status: DC
  Administered 2017-05-25 – 2017-05-27 (×5): 250 mg via ORAL
  Filled 2017-05-25 (×4): qty 1

## 2017-05-25 MED ORDER — FUROSEMIDE 10 MG/ML IJ SOLN
20.0000 mg | Freq: Every day | INTRAMUSCULAR | Status: DC
  Administered 2017-05-25 – 2017-05-27 (×3): 20 mg via INTRAVENOUS
  Filled 2017-05-25 (×3): qty 2

## 2017-05-25 MED ORDER — FENTANYL 12 MCG/HR TD PT72
50.0000 ug | MEDICATED_PATCH | TRANSDERMAL | Status: DC
  Administered 2017-05-25: 50 ug via TRANSDERMAL
  Filled 2017-05-25: qty 4

## 2017-05-25 NOTE — Progress Notes (Signed)
[facility] PROGRESS NOTE  Alexander [name] WUJ:[id number] DOB: 26-May-1956 DOA: 05/19/2017 PCP: [name], Gerardo E, MD  Interim summary and HPI [phone] with h/o TBI and C1 cervical fracture, CVA, DM, HTN, PVD, Seizures, Multiple aspiration pneumonia, Chronic foley with multiple UTI's, Depression with multiple prior suicide attempts, Nonverbal baseline, presents today from his nursing home with fever (T:104), Hypotension, Tachycardia, and reported AMS (unclear baseline), who was found in the ER to be in septic shock due to UTI  Assessment/Plan: 1-septic shock due to UTI and providence bacteremia: -patient shock status resolved -continue rocephin -repeat blood cx's no growth  -no fever and WBC's WNL now -continue supportive care; will discussed with ID length of therapy, given bacteremia.  2-acute hypoxic resp failure: most likely in the setting of ARDS with aggressive IVF's given during septic shock resuscitation. Required ventilatory support from 11/17 >>11/21 -continue weaning O2 as tolerated -patient with hx of recurrent aspiration; but no infiltrates seen on repeat x-ray -continue supportive care, good pulmonary hygiene and diuresis as tolerated to continue accomplishing drying of his lungs.  3-acute toxic encephalopathy: from UTI most likely -resolved and back to baseline now -patient nonverbal at baseline, but communicate with hands signal -following commands appropriately.  4-hypokalemia/hypophosphatemia -continue to monitor electrolytes and replete further as needed   5-anemia of chronic disease: with component of hemodilution -no signs of active bleeding -will follow Hgb trend   6-thrombocytopenia -probably from sepsis -will follow trend  -no active bleeding   7-type 2 diabetes -continue SSI  -follow CBG's  8-chronic pain: -will resume fentanyl patch  9-GERD/GI prophylaxis -continue PPI  10-severe protein calorie malnutrition -continue tube feedings    11-seizure disorder -no seizures appreciated  -continue current AE drugs.   Code Status: Full Family Communication: no family at bedside  Disposition Plan: transfer to med-surg, follow repeat culture; continue IV antibiotics and supportive care. Will go back to SNF at discharge.   Consultants:  PCCM  Procedures:  Ventilatory dependency 11/17>>11/21  See below for x-ray reports  Antibiotics:  vanc 11/17>>>11/19  Meropenem 11/17>> 11/20  Rocephin 11/20  HPI/Subjective: Afebrile, no CP, improved breathing (even still with increase secretions); no nausea, no vomiting and no abd pain.  Objective: Vitals:   05/25/17 1000 05/25/17 1100  BP: (!) 149/92   Pulse: 94 88  Resp: (!) 30 (!) 25  Temp:    SpO2: 100% 100%    Intake/Output Summary (Last 24 hours) at 05/25/2017 1120 Last data filed at 05/25/2017 1100 Gross per 24 hour  Intake 1887.5 ml  Output 1485 ml  Net 402.5 ml   Filed Weights   05/23/17 0423 05/24/17 0438 05/25/17 0500  Weight: 78.6 kg (173 lb 4.5 oz) 79.9 kg (176 lb 2.4 oz) 76.3 kg (168 lb 3.4 oz)    Exam:   General:  Afebrile, no CP, no nausea, no vomiting. Breathing better and improving. Still with increase secretion requiring suctioning and pulmonary toiletry. Non-verbal at baseline.  Cardiovascular: S1 and S2, no rubs, no gallops  Respiratory: scattered rhonchi, bibasilar crackles, no wheezing.  Abdomen: soft, NT, ND, positive BS, peg tube in place  Musculoskeletal: no cyanosis, trace edema bilaterally, no clubbing.  Data Reviewed: Basic Metabolic Panel: Recent Labs  Lab 05/21/17 0355  05/21/17 1646 05/22/17 0357 05/22/17 1241 05/23/17 0509 05/24/17 0205 05/25/17 0242  NA 137   < >  --  139 143 140 143 143  K 2.5*   < >  --  3.0* 3.6 3.2* 3.9 3.5  CL  113*   < >  --  115* 116* 116* 115* 115*  CO2 18*   < >  --  17* 23 16* 23 23  GLUCOSE 136*   < >  --  126* 112* 118* 89 100*  BUN 32*   < >  --  30* 29* 28* 29* 31*   CREATININE 0.70   < >  --  0.65 0.66 0.59* 0.91 0.92  CALCIUM 8.3*   < >  --  8.3* 8.4* 8.4* 8.5* 8.6*  MG 2.0  --  1.9 1.9  --  1.9 2.0 2.2  PHOS 2.1*  --  4.2  --   --  2.2* 3.9  4.0 3.4   < > = values in this interval not displayed.   Liver Function Tests: Recent Labs  Lab 05/19/17 0724 05/23/17 0509 05/24/17 0205 05/25/17 0242  AST 33  --   --   --   ALT 32  --   --   --   ALKPHOS 105  --   --   --   BILITOT 0.4  --   --   --   PROT 6.8  --   --   --   ALBUMIN 3.3* 2.4* 2.4* 2.5*   Recent Labs  Lab 05/19/17 0726  LIPASE 15   No results for input(s): AMMONIA in the last 168 hours. CBC: Recent Labs  Lab 05/19/17 0724  05/21/17 1646 05/22/17 0357 05/23/17 0509 05/24/17 0205 05/25/17 0242  WBC 13.7*   < > 3.5* 6.1 6.8 9.3 9.0  NEUTROABS 12.4*  --   --  4.0 4.7 6.5 6.3  HGB 12.3*   < > 9.4* 9.4* 9.7* 10.0* 10.2*  HCT 37.4*   < > 29.1* 29.1* 29.8* 31.3* 32.1*  MCV 98.7   < > 95.4 94.8 96.4 97.2 98.5  PLT 102*   < > 86* 103* 115* 149* 182   < > = values in this interval not displayed.   Cardiac Enzymes: Recent Labs  Lab 05/19/17 1516 05/19/17 1916 05/20/17 0255  TROPONINI 0.06* <0.03 <0.03   BNP (last 3 results) Recent Labs    05/19/17 1516  BNP 204.3*   CBG: Recent Labs  Lab 05/24/17 0737 05/24/17 1135 05/24/17 1458 05/24/17 2028 05/25/17 0414  GLUCAP 106* 104* 99 91 84    Recent Results (from the past 240 hour(s))  Culture, blood (Routine x 2)     Status: Abnormal   Collection Time: 05/19/17  7:20 AM  Result Value Ref Range Status   Specimen Description BLOOD RIGHT WRIST  Final   Special Requests   Final    BOTTLES DRAWN AEROBIC AND ANAEROBIC Blood Culture adequate volume   Culture  Setup Time   Final    GRAM NEGATIVE RODS IN BOTH AEROBIC AND ANAEROBIC BOTTLES CRITICAL RESULT CALLED TO, READ BACK BY AND VERIFIED WITH: K. WEIGLE,PHARMD 0134 05/20/2017 T. TYSOR    Culture PROVIDENCIA STUARTII (A)  Final   Report Status 05/22/2017 FINAL   Final   Organism ID, Bacteria PROVIDENCIA STUARTII  Final      Susceptibility   Providencia stuartii - MIC*    AMPICILLIN >=32 RESISTANT Resistant     CEFAZOLIN >=64 RESISTANT Resistant     CEFEPIME <=1 SENSITIVE Sensitive     CEFTAZIDIME <=1 SENSITIVE Sensitive     CEFTRIAXONE <=1 SENSITIVE Sensitive     CIPROFLOXACIN >=4 RESISTANT Resistant     GENTAMICIN RESISTANT Resistant     IMIPENEM 1 SENSITIVE  Sensitive     TRIMETH/SULFA >=320 RESISTANT Resistant     AMPICILLIN/SULBACTAM >=32 RESISTANT Resistant     PIP/TAZO <=4 SENSITIVE Sensitive     * PROVIDENCIA STUARTII  Blood Culture ID Panel (Reflexed)     Status: None   Collection Time: 05/19/17  7:20 AM  Result Value Ref Range Status   Enterococcus species NOT DETECTED NOT DETECTED Final   Listeria monocytogenes NOT DETECTED NOT DETECTED Final   Staphylococcus species NOT DETECTED NOT DETECTED Final   Staphylococcus aureus NOT DETECTED NOT DETECTED Final   Streptococcus species NOT DETECTED NOT DETECTED Final   Streptococcus agalactiae NOT DETECTED NOT DETECTED Final   Streptococcus pneumoniae NOT DETECTED NOT DETECTED Final   Streptococcus pyogenes NOT DETECTED NOT DETECTED Final   Acinetobacter baumannii NOT DETECTED NOT DETECTED Final   Enterobacteriaceae species NOT DETECTED NOT DETECTED Final   Enterobacter cloacae complex NOT DETECTED NOT DETECTED Final   Escherichia coli NOT DETECTED NOT DETECTED Final   Klebsiella oxytoca NOT DETECTED NOT DETECTED Final   Klebsiella pneumoniae NOT DETECTED NOT DETECTED Final   Proteus species NOT DETECTED NOT DETECTED Final   Serratia marcescens NOT DETECTED NOT DETECTED Final   Haemophilus influenzae NOT DETECTED NOT DETECTED Final   Neisseria meningitidis NOT DETECTED NOT DETECTED Final   Pseudomonas aeruginosa NOT DETECTED NOT DETECTED Final   Candida albicans NOT DETECTED NOT DETECTED Final   Candida glabrata NOT DETECTED NOT DETECTED Final   Candida krusei NOT DETECTED NOT  DETECTED Final   Candida parapsilosis NOT DETECTED NOT DETECTED Final   Candida tropicalis NOT DETECTED NOT DETECTED Final  Culture, blood (Routine x 2)     Status: None   Collection Time: 05/19/17  8:01 AM  Result Value Ref Range Status   Specimen Description BLOOD LEFT HAND  Final   Special Requests IN PEDIATRIC BOTTLE Blood Culture adequate volume  Final   Culture NO GROWTH 5 DAYS  Final   Report Status 05/24/2017 FINAL  Final  Culture, respiratory (tracheal aspirate)     Status: None   Collection Time: 05/19/17 11:46 AM  Result Value Ref Range Status   Specimen Description TRACHEAL ASPIRATE  Final   Special Requests NONE  Final   Gram Stain   Final    ABUNDANT WBC PRESENT,BOTH PMN AND MONONUCLEAR RARE SQUAMOUS EPITHELIAL CELLS PRESENT MODERATE GRAM POSITIVE RODS FEW GRAM NEGATIVE RODS FEW BUDDING YEAST SEEN    Culture Consistent with normal respiratory flora.  Final   Report Status 05/21/2017 FINAL  Final  MRSA PCR Screening     Status: Abnormal   Collection Time: 05/19/17  1:00 PM  Result Value Ref Range Status   MRSA by PCR POSITIVE (A) NEGATIVE Final    Comment:        The GeneXpert MRSA Assay (FDA approved for NASAL specimens only), is one component of a comprehensive MRSA colonization surveillance program. It is not intended to diagnose MRSA infection nor to guide or monitor treatment for MRSA infections. RESULT CALLED TO, READ BACK BY AND VERIFIED WITH: F.MPAY RN AT 1510 05/19/17 BY A.DAVIS   Urine culture     Status: Abnormal   Collection Time: 05/19/17  5:50 PM  Result Value Ref Range Status   Specimen Description URINE, CATHETERIZED  Final   Special Requests NONE  Final   Culture <10,000 COLONIES/mL INSIGNIFICANT GROWTH (A)  Final   Report Status 05/20/2017 FINAL  Final  Culture, blood (routine x 2)     Status: None (Preliminary result)  Collection Time: 05/22/17  3:00 PM  Result Value Ref Range Status   Specimen Description BLOOD RIGHT ANTECUBITAL   Final   Special Requests   Final    BOTTLES DRAWN AEROBIC AND ANAEROBIC Blood Culture adequate volume   Culture NO GROWTH 2 DAYS  Final   Report Status PENDING  Incomplete  Culture, blood (routine x 2)     Status: None (Preliminary result)   Collection Time: 05/22/17  3:15 PM  Result Value Ref Range Status   Specimen Description BLOOD BLOOD RIGHT FOREARM  Final   Special Requests   Final    BOTTLES DRAWN AEROBIC AND ANAEROBIC Blood Culture adequate volume   Culture NO GROWTH 2 DAYS  Final   Report Status PENDING  Incomplete     Studies: Dg Chest Port 1 View  Result Date: 05/24/2017 CLINICAL DATA:  Respiratory failure EXAM: PORTABLE CHEST 1 VIEW COMPARISON:  05/23/2017 FINDINGS: Endotracheal tube removed. Mild improvement in left lower lobe airspace disease. Progressive atelectasis/ consolidation right lower lobe. Small bilateral effusions unchanged IMPRESSION: Bibasilar airspace disease, with progression on the right and mild improvement on the left. Endotracheal tube removed. Electronically Signed   By: Marlan Palau M.D.   On: 05/24/2017 07:11    Scheduled Meds: . busPIRone  15 mg Per Tube BID  . docusate  100 mg Per Tube BID  . finasteride  5 mg Oral Daily  . gabapentin  300 mg Per Tube TID  . heparin  5,000 Units Subcutaneous Q8H  . insulin aspart  0-9 Units Subcutaneous Q4H  . lacosamide  100 mg Per Tube BID  . levETIRAcetam  250 mg Per Tube BID  . mouth rinse  15 mL Mouth Rinse BID  . Melatonin  9 mg Tube QHS  . OXcarbazepine  300 mg Per Tube QHS  . pantoprazole sodium  40 mg Per Tube Q24H  . sennosides  5 mL Per Tube QHS  . topiramate  100 mg Per Tube BID  . traZODone  100 mg Per Tube QHS  . venlafaxine  75 mg Per Tube TID   Continuous Infusions: . sodium chloride 250 mL (05/24/17 1900)  . cefTRIAXone (ROCEPHIN)  IV Stopped (05/24/17 2031)  . feeding supplement (VITAL AF 1.2 CAL) 1,000 mL (05/25/17 1114)    Time spent: 35 minutes    Vassie Loll  [facility] Pager [phone]. If 7PM-7AM, please contact night-coverage at [email or web address], password [facility] 05/25/2017, 11:20 AM  LOS: 6 days

## 2017-05-25 NOTE — Care Management Important Message (Signed)
Important Message  Patient Details  Name: Loreen FreudMartin A Jago MRN: [id number] Date of Birth: 1955-12-17   Medicare Important Message Given:  Yes    Burgess Sheriff, Annamarie Majorheryl U, RN 05/25/2017, 12:15 PM

## 2017-05-25 NOTE — Progress Notes (Signed)
Pharmacy Antibiotic Note  Alexander [name] is a 61 y.o. male with a PMH of CVA, Depression, Gout, HLD, HTN, Seizure. Patient was admitted on 05/19/2017 with bacteremia. Patient's blood culture came back Providencia Stuartii, susceptible to Ceftriaxone, Cefepime, and Ceftazidime. Now on Ceftriaxone.   Assessment: Patient has remained afebrile WBC are within normal limits. Repeat blood cultures from 11/20 remain negative. Renal function is stable.   Plan: Continue Ceftriaxone 2 gm every 24 hours  F/U stop date Pharmacy will sign-off. Please re-consult as needed.    Height: 6\' 1"  (185.4 cm) Weight: 168 lb 3.4 oz (76.3 kg) IBW/kg (Calculated) : 79.9  Temp (24hrs), Avg:98.8 F (37.1 C), Min:98.5 F (36.9 C), Max:99.2 F (37.3 C)  Recent Labs  Lab 05/19/17 0727 05/19/17 1032 05/19/17 1516 05/19/17 1916  05/21/17 1646 05/22/17 0357 05/22/17 1241 05/23/17 0509 05/24/17 0205 05/25/17 0242  WBC  --   --   --   --    < > 3.5* 6.1  --  6.8 9.3 9.0  CREATININE  --   --   --   --    < >  --  0.65 0.66 0.59* 0.91 0.92  LATICACIDVEN 1.66 1.00 2.9* 2.7*  --   --   --   --   --   --   --    < > = values in this interval not displayed.    Estimated Creatinine Clearance: 93.3 mL/min (by C-G formula based on SCr of 0.92 mg/dL).    Allergies  Allergen Reactions  . Amoxicillin Itching, Rash and Other (See Comments)    NOTED ON MAR Has patient had a PCN reaction causing immediate rash, facial/tongue/throat swelling, SOB or lightheadedness with hypotension: Unknown Has patient had a PCN reaction causing severe rash involving mucus membranes or skin necrosis: Unknown Has patient had a PCN reaction that required hospitalization Unknown Has patient had a PCN reaction occurring within the last 10 years: Unknown If all of the above answers are "NO", then may proceed with Cephalosporin use.     Antimicrobials this admission: 11/17 Meropenem >> 11/20 11/17 Vancomycin >> 11/20 11/20  Ceftriaxone >>   Dose adjustments this admission: N/A  Microbiology results: 11/20 BCx: NGTD 11/17 BCx 1/2: Providencia Stuartii (R: Ampicillin, Cefazolin, Ciprofloxacin, Gentamicin, Trimeth/Sulfa, Amp/Sul) 11/8 UCx: <10,000 colonies, insignificant growth  11/19 Tracheal aspirate Cx: Normal respiratory flora 11/17 MRSA PCR: Positive  Thank you for allowing pharmacy to be a part of this patient's care.  Della GooEmily S Sinclair , PharmD, BCPS PGY2 Infectious Diseases Pharmacy Resident Phone X [phone] 05/25/2017 11:40 AM

## 2017-05-26 DIAGNOSIS — N390 Urinary tract infection, site not specified: Secondary | ICD-10-CM

## 2017-05-26 DIAGNOSIS — R319 Hematuria, unspecified: Secondary | ICD-10-CM

## 2017-05-26 DIAGNOSIS — R7881 Bacteremia: Secondary | ICD-10-CM

## 2017-05-26 DIAGNOSIS — R4182 Altered mental status, unspecified: Secondary | ICD-10-CM

## 2017-05-26 LAB — GLUCOSE, CAPILLARY
GLUCOSE-CAPILLARY: 105 mg/dL — AB (ref 65–99)
GLUCOSE-CAPILLARY: 106 mg/dL — AB (ref 65–99)
GLUCOSE-CAPILLARY: 82 mg/dL (ref 65–99)
GLUCOSE-CAPILLARY: 90 mg/dL (ref 65–99)
GLUCOSE-CAPILLARY: 93 mg/dL (ref 65–99)
Glucose-Capillary: 92 mg/dL (ref 65–99)
Glucose-Capillary: 97 mg/dL (ref 65–99)
Glucose-Capillary: 86 mg/dL (ref 65–99)

## 2017-05-26 LAB — CBC
HCT: 31 % — ABNORMAL LOW (ref 39.0–52.0)
Hemoglobin: 9.7 g/dL — ABNORMAL LOW (ref 13.0–17.0)
MCH: 31.1 pg (ref 26.0–34.0)
MCHC: 31.3 g/dL (ref 30.0–36.0)
MCV: 99.4 fL (ref 78.0–100.0)
PLATELETS: 211 10*3/uL (ref 150–400)
RBC: 3.12 MIL/uL — AB (ref 4.22–5.81)
RDW: 15.2 % (ref 11.5–15.5)
WBC: 8.9 10*3/uL (ref 4.0–10.5)

## 2017-05-26 LAB — BASIC METABOLIC PANEL
ANION GAP: 6 (ref 5–15)
BUN: 32 mg/dL — ABNORMAL HIGH (ref 6–20)
CALCIUM: 8.8 mg/dL — AB (ref 8.9–10.3)
CO2: 25 mmol/L (ref 22–32)
Chloride: 113 mmol/L — ABNORMAL HIGH (ref 101–111)
Creatinine, Ser: 0.83 mg/dL (ref 0.61–1.24)
GLUCOSE: 101 mg/dL — AB (ref 65–99)
POTASSIUM: 3.6 mmol/L (ref 3.5–5.1)
Sodium: 144 mmol/L (ref 135–145)

## 2017-05-26 NOTE — Plan of Care (Signed)
  Nutrition: Adequate nutrition will be maintained Continues to have tube feeding in place.  05/26/2017 0451 - Progressing by Burtis Junesrewery, Betzalel Umbarger, RN   Activity: Risk for activity intolerance will decrease Encouraged to move in bed. 05/26/2017 0451 - Progressing by Burtis Junesrewery, Ranessa Kosta, RN   Elimination: Will not experience complications related to bowel motility Patient has incontinent soft stool. 05/26/2017 0451 - Progressing by Burtis Junesrewery, Gustaf Mccarter, RN   Elimination: Will not experience complications related to urinary retention Foley remains in place. 05/26/2017 0451 - Progressing by Burtis Junesrewery, Temiloluwa Recchia, RN   Skin Integrity: Risk for impaired skin integrity will decrease Turn q2-3hours overnight, encouraged patient to shift hips some while resting in bed. Applied sacral/buttock foam dressing, to red excoriated area. Wearing pressure relief boots bilateral feet.  05/26/2017 0451 - Progressing by Burtis Junesrewery, Tripp Goins, RN

## 2017-05-26 NOTE — Progress Notes (Signed)
[facility] PROGRESS NOTE  Alexander [name] UJW:[id number] DOB: 1955/12/07 DOA: 05/19/2017 PCP: [name], Gerardo E, MD  Interim summary and HPI [phone] with h/o TBI and C1 cervical fracture, CVA, DM, HTN, PVD, Seizures, Multiple aspiration pneumonia, Chronic foley with multiple UTI's, Depression with multiple prior suicide attempts, Nonverbal baseline, presents today from his nursing home with fever (T:104), Hypotension, Tachycardia, and reported AMS (unclear baseline), who was found in the ER to be in septic shock due to UTI  Assessment/Plan: 1-septic shock due to UTI and providence bacteremia: -patient shock status resolved -will continue rocephin -no fever and WBC's WNL now -continue supportive care -case discussed with ID and pharmacy will pursuit IV antibiotics for a total of 14 days. He needs 6-7 more days currently and is stable for discharge otherwise. Will place PICC line to continue IV antibiotics at SNF. -repeat blood cx's neg.  2-acute hypoxic resp failure: most likely in the setting of ARDS with aggressive IVF's given during septic shock resuscitation. Required ventilatory support from 11/17 >>11/21 -continue weaning O2 as tolerated -patient with hx of recurrent aspiration; but no infiltrates seen on repeat x-ray -continue supportive care, good pulmonary hygiene/toiletry and intermittent diuresis as tolerated to continue accomplishing drying of his lungs. -breathing is much improved.  3-acute toxic encephalopathy: from UTI most likely -resolved and back to baseline now -patient nonverbal at baseline, but communicate with hands signal -continue to follow commands appropriately.  4-hypokalemia/hypophosphatemia -continue to monitor electrolytes and replete further as needed  -K 3.5  5-anemia of chronic disease: with component of hemodilution -no signs of active bleeding -will follow Hgb trend intermittently   6-thrombocytopenia -probably from sepsis -now  resolved and WNL (Plt: 211,000) -will follow trend  -no active bleeding appreciated  7-type 2 diabetes -continue SSI and follow CBG's -if needed base on CBG's fluctuation will add low dose of levemir.  8-chronic pain: -continue fentanyl patch; patient denying severe pain currently  9-GERD/GI prophylaxis -will continue PPI   10-severe protein calorie malnutrition -continue tube feedings  -so far well tolerated and no increase in residuals  11-seizure disorder -no seizures appreciated  -continue current AE drugs.   Code Status: Full Family Communication: no family at bedside  Disposition Plan: transfer to med-surg, follow repeat culture; continue IV antibiotics and supportive care. Will go back to SNF at discharge. Will place PICC line.   Consultants:  PCCM  ID (Curbside, Dr. Ninetta LightsHatcher recommended continue IV antibiotics and treat for 14 days total; pharmacy also in agreement and corroboration with plan)  Procedures:  Ventilatory dependency 11/17>>11/21  See below for x-ray reports  Antibiotics:  vanc 11/17>>>11/19  Meropenem 11/17>> 11/20  Rocephin 11/20  HPI/Subjective: No fever, breathing much improved overall. Patient denying CP, abd pain, nausea, vomiting or any other complaints.   Objective: Vitals:   05/26/17 0300 05/26/17 0800  BP: 130/72   Pulse: 60   Resp: 18   Temp: 97.9 F (36.6 C)   SpO2: 100% 100%    Intake/Output Summary (Last 24 hours) at 05/26/2017 1118 Last data filed at 05/26/2017 0830 Gross per 24 hour  Intake 1249.92 ml  Output 1765 ml  Net -515.08 ml   Filed Weights   05/24/17 0438 05/25/17 0500 05/26/17 0420  Weight: 79.9 kg (176 lb 2.4 oz) 76.3 kg (168 lb 3.4 oz) 74.3 kg (163 lb 14.4 oz)    Exam:   General:  No fever, no CP, no abd pain, no nausea, no vomiting. Reports breathing is stable and is in no  distress. No-verbal at baseline   Cardiovascular: S1 and S2, no rubs, no gallops  Respiratory: scattered rhonchi, no  wheezing, no frank crackles, improved air movement.   Abdomen: soft, NT, ND, positive BS; peg tube in place, no signs of infection, erythema or drainage appreciated.  Musculoskeletal: no cyanosis, no clubbing. No edema appreciated.  Data Reviewed: Basic Metabolic Panel: Recent Labs  Lab 05/21/17 0355  05/21/17 1646 05/22/17 0357 05/22/17 1241 05/23/17 0509 05/24/17 0205 05/25/17 0242 05/26/17 0422  NA 137   < >  --  139 143 140 143 143 144  K 2.5*   < >  --  3.0* 3.6 3.2* 3.9 3.5 3.6  CL 113*   < >  --  115* 116* 116* 115* 115* 113*  CO2 18*   < >  --  17* 23 16* 23 23 25   GLUCOSE 136*   < >  --  126* 112* 118* 89 100* 101*  BUN 32*   < >  --  30* 29* 28* 29* 31* 32*  CREATININE 0.70   < >  --  0.65 0.66 0.59* 0.91 0.92 0.83  CALCIUM 8.3*   < >  --  8.3* 8.4* 8.4* 8.5* 8.6* 8.8*  MG 2.0  --  1.9 1.9  --  1.9 2.0 2.2  --   PHOS 2.1*  --  4.2  --   --  2.2* 3.9  4.0 3.4  --    < > = values in this interval not displayed.   Liver Function Tests: Recent Labs  Lab 05/23/17 0509 05/24/17 0205 05/25/17 0242  ALBUMIN 2.4* 2.4* 2.5*   No results for input(s): LIPASE, AMYLASE in the last 168 hours. No results for input(s): AMMONIA in the last 168 hours. CBC: Recent Labs  Lab 05/22/17 0357 05/23/17 0509 05/24/17 0205 05/25/17 0242 05/26/17 0422  WBC 6.1 6.8 9.3 9.0 8.9  NEUTROABS 4.0 4.7 6.5 6.3  --   HGB 9.4* 9.7* 10.0* 10.2* 9.7*  HCT 29.1* 29.8* 31.3* 32.1* 31.0*  MCV 94.8 96.4 97.2 98.5 99.4  PLT 103* 115* 149* 182 211   Cardiac Enzymes: Recent Labs  Lab 05/19/17 1516 05/19/17 1916 05/20/17 0255  TROPONINI 0.06* <0.03 <0.03   BNP (last 3 results) Recent Labs    05/19/17 1516  BNP 204.3*   CBG: Recent Labs  Lab 05/25/17 2020 05/26/17 0035 05/26/17 0054 05/26/17 0409 05/26/17 0759  GLUCAP 97 90 82 97 86    Recent Results (from the past 240 hour(s))  Culture, blood (Routine x 2)     Status: Abnormal   Collection Time: 05/19/17  7:20 AM  Result  Value Ref Range Status   Specimen Description BLOOD RIGHT WRIST  Final   Special Requests   Final    BOTTLES DRAWN AEROBIC AND ANAEROBIC Blood Culture adequate volume   Culture  Setup Time   Final    GRAM NEGATIVE RODS IN BOTH AEROBIC AND ANAEROBIC BOTTLES CRITICAL RESULT CALLED TO, READ BACK BY AND VERIFIED WITH: K. WEIGLE,PHARMD 0134 05/20/2017 T. TYSOR    Culture PROVIDENCIA STUARTII (A)  Final   Report Status 05/22/2017 FINAL  Final   Organism ID, Bacteria PROVIDENCIA STUARTII  Final      Susceptibility   Providencia stuartii - MIC*    AMPICILLIN >=32 RESISTANT Resistant     CEFAZOLIN >=64 RESISTANT Resistant     CEFEPIME <=1 SENSITIVE Sensitive     CEFTAZIDIME <=1 SENSITIVE Sensitive     CEFTRIAXONE <=1 SENSITIVE Sensitive  CIPROFLOXACIN >=4 RESISTANT Resistant     GENTAMICIN RESISTANT Resistant     IMIPENEM 1 SENSITIVE Sensitive     TRIMETH/SULFA >=320 RESISTANT Resistant     AMPICILLIN/SULBACTAM >=32 RESISTANT Resistant     PIP/TAZO <=4 SENSITIVE Sensitive     * PROVIDENCIA STUARTII  Blood Culture ID Panel (Reflexed)     Status: None   Collection Time: 05/19/17  7:20 AM  Result Value Ref Range Status   Enterococcus species NOT DETECTED NOT DETECTED Final   Listeria monocytogenes NOT DETECTED NOT DETECTED Final   Staphylococcus species NOT DETECTED NOT DETECTED Final   Staphylococcus aureus NOT DETECTED NOT DETECTED Final   Streptococcus species NOT DETECTED NOT DETECTED Final   Streptococcus agalactiae NOT DETECTED NOT DETECTED Final   Streptococcus pneumoniae NOT DETECTED NOT DETECTED Final   Streptococcus pyogenes NOT DETECTED NOT DETECTED Final   Acinetobacter baumannii NOT DETECTED NOT DETECTED Final   Enterobacteriaceae species NOT DETECTED NOT DETECTED Final   Enterobacter cloacae complex NOT DETECTED NOT DETECTED Final   Escherichia coli NOT DETECTED NOT DETECTED Final   Klebsiella oxytoca NOT DETECTED NOT DETECTED Final   Klebsiella pneumoniae NOT DETECTED  NOT DETECTED Final   Proteus species NOT DETECTED NOT DETECTED Final   Serratia marcescens NOT DETECTED NOT DETECTED Final   Haemophilus influenzae NOT DETECTED NOT DETECTED Final   Neisseria meningitidis NOT DETECTED NOT DETECTED Final   Pseudomonas aeruginosa NOT DETECTED NOT DETECTED Final   Candida albicans NOT DETECTED NOT DETECTED Final   Candida glabrata NOT DETECTED NOT DETECTED Final   Candida krusei NOT DETECTED NOT DETECTED Final   Candida parapsilosis NOT DETECTED NOT DETECTED Final   Candida tropicalis NOT DETECTED NOT DETECTED Final  Culture, blood (Routine x 2)     Status: None   Collection Time: 05/19/17  8:01 AM  Result Value Ref Range Status   Specimen Description BLOOD LEFT HAND  Final   Special Requests IN PEDIATRIC BOTTLE Blood Culture adequate volume  Final   Culture NO GROWTH 5 DAYS  Final   Report Status 05/24/2017 FINAL  Final  Culture, respiratory (tracheal aspirate)     Status: None   Collection Time: 05/19/17 11:46 AM  Result Value Ref Range Status   Specimen Description TRACHEAL ASPIRATE  Final   Special Requests NONE  Final   Gram Stain   Final    ABUNDANT WBC PRESENT,BOTH PMN AND MONONUCLEAR RARE SQUAMOUS EPITHELIAL CELLS PRESENT MODERATE GRAM POSITIVE RODS FEW GRAM NEGATIVE RODS FEW BUDDING YEAST SEEN    Culture Consistent with normal respiratory flora.  Final   Report Status 05/21/2017 FINAL  Final  MRSA PCR Screening     Status: Abnormal   Collection Time: 05/19/17  1:00 PM  Result Value Ref Range Status   MRSA by PCR POSITIVE (A) NEGATIVE Final    Comment:        The GeneXpert MRSA Assay (FDA approved for NASAL specimens only), is one component of a comprehensive MRSA colonization surveillance program. It is not intended to diagnose MRSA infection nor to guide or monitor treatment for MRSA infections. RESULT CALLED TO, READ BACK BY AND VERIFIED WITH: F.MPAY RN AT 1510 05/19/17 BY A.DAVIS   Urine culture     Status: Abnormal    Collection Time: 05/19/17  5:50 PM  Result Value Ref Range Status   Specimen Description URINE, CATHETERIZED  Final   Special Requests NONE  Final   Culture <10,000 COLONIES/mL INSIGNIFICANT GROWTH (A)  Final   Report  Status 05/20/2017 FINAL  Final  Culture, blood (routine x 2)     Status: None (Preliminary result)   Collection Time: 05/22/17  3:00 PM  Result Value Ref Range Status   Specimen Description BLOOD RIGHT ANTECUBITAL  Final   Special Requests   Final    BOTTLES DRAWN AEROBIC AND ANAEROBIC Blood Culture adequate volume   Culture NO GROWTH 3 DAYS  Final   Report Status PENDING  Incomplete  Culture, blood (routine x 2)     Status: None (Preliminary result)   Collection Time: 05/22/17  3:15 PM  Result Value Ref Range Status   Specimen Description BLOOD BLOOD RIGHT FOREARM  Final   Special Requests   Final    BOTTLES DRAWN AEROBIC AND ANAEROBIC Blood Culture adequate volume   Culture NO GROWTH 3 DAYS  Final   Report Status PENDING  Incomplete     Studies: No results found.  Scheduled Meds: . busPIRone  15 mg Per Tube BID  . docusate  100 mg Per Tube BID  . fentaNYL  50 mcg Transdermal Q72H  . finasteride  5 mg Oral Daily  . furosemide  20 mg Intravenous Daily  . gabapentin  300 mg Per Tube TID  . heparin  5,000 Units Subcutaneous Q8H  . insulin aspart  0-9 Units Subcutaneous Q4H  . lacosamide  100 mg Per Tube BID  . levETIRAcetam  250 mg Per Tube BID  . mouth rinse  15 mL Mouth Rinse BID  . Melatonin  9 mg Tube QHS  . OXcarbazepine  300 mg Per Tube QHS  . pantoprazole sodium  40 mg Per Tube Q24H  . saccharomyces boulardii  250 mg Oral BID  . sennosides  5 mL Per Tube QHS  . topiramate  100 mg Per Tube BID  . traZODone  100 mg Per Tube QHS  . venlafaxine  75 mg Per Tube TID   Continuous Infusions: . sodium chloride 250 mL (05/24/17 1900)  . cefTRIAXone (ROCEPHIN)  IV Stopped (05/25/17 1940)  . feeding supplement (VITAL AF 1.2 CAL) 1,000 mL (05/25/17 1114)     Time spent: 30 minutes    Vassie Lollarlos Brooklinn Longbottom  [facility] Pager [phone]. If 7PM-7AM, please contact night-coverage at [email or web address], password [email or web address] 05/26/2017, 11:18 AM  LOS: 7 days

## 2017-05-27 DIAGNOSIS — A419 Sepsis, unspecified organism: Secondary | ICD-10-CM

## 2017-05-27 DIAGNOSIS — Z960 Presence of urogenital implants: Secondary | ICD-10-CM

## 2017-05-27 DIAGNOSIS — N390 Urinary tract infection, site not specified: Secondary | ICD-10-CM

## 2017-05-27 DIAGNOSIS — Z978 Presence of other specified devices: Secondary | ICD-10-CM

## 2017-05-27 LAB — CULTURE, BLOOD (ROUTINE X 2)
Culture: NO GROWTH
Culture: NO GROWTH
SPECIAL REQUESTS: ADEQUATE
Special Requests: ADEQUATE

## 2017-05-27 LAB — GLUCOSE, CAPILLARY
GLUCOSE-CAPILLARY: 91 mg/dL (ref 65–99)
GLUCOSE-CAPILLARY: 91 mg/dL (ref 65–99)
Glucose-Capillary: 108 mg/dL — ABNORMAL HIGH (ref 65–99)
Glucose-Capillary: 120 mg/dL — ABNORMAL HIGH (ref 65–99)
Glucose-Capillary: 99 mg/dL (ref 65–99)

## 2017-05-27 MED ORDER — SODIUM CHLORIDE 0.9% FLUSH: 10.0000 mL | INTRAVENOUS | Status: DC | PRN

## 2017-05-27 MED ORDER — FENTANYL 50 MCG/HR TD PT72: 50.0000 ug | MEDICATED_PATCH | TRANSDERMAL | 0 refills | Status: DC

## 2017-05-27 MED ORDER — OXYCODONE HCL 10 MG PO TABS: 10.0000 mg | ORAL_TABLET | Freq: Three times a day (TID) | ORAL | 0 refills | Status: DC | PRN

## 2017-05-27 MED ORDER — CEFTRIAXONE IV (FOR PTA / DISCHARGE USE ONLY): 2.0000 g | INTRAVENOUS | Status: AC

## 2017-05-27 NOTE — Plan of Care (Signed)
Patient is progressing, but he is unable to manage his own health issues.  RN will continue to monitor. P.J. Henderson NewcomerSexton, RN

## 2017-05-27 NOTE — Progress Notes (Signed)
[facility]  RN Jasmine DecemberSharon.

## 2017-05-27 NOTE — Clinical Social Work Placement (Signed)
   CLINICAL SOCIAL WORK PLACEMENT  NOTE  Date:  05/27/2017  Patient Details  Name: Alexander [name] MRN: [id number] Date of Birth: 27-Mar-1956  Clinical Social Work is seeking post-discharge placement for this patient at the Skilled  Nursing Facility([facility]) level of care (*CSW will initial, date and re-position this form in  chart as items are completed):  Yes   Patient/family provided with [facility] Clinical Social Work Department's list of facilities offering this level of care within the geographic area requested by the patient (or if unable, by the patient's family).  Yes   Patient/family informed of their freedom to choose among providers that offer the needed level of care, that participate in Medicare, Medicaid or managed care program needed by the patient, have an available bed and are willing to accept the patient.  Yes   Patient/family informed of [facility] ownership interest in [facility] and [facility], as well as of the fact that they are under no obligation to receive care at these facilities.  PASRR submitted to EDS on       PASRR number received on       Existing PASRR number confirmed on       FL2 transmitted to all facilities in geographic area requested by pt/family on       FL2 transmitted to all facilities within larger geographic area on       Patient informed that his/her managed care company has contracts with or will negotiate with certain facilities, including the following:        Yes   Patient/family informed of bed offers received.  Patient chooses bed at       Physician recommends and patient chooses bed at      [facility] to [facility] on 05/27/17.  Patient to be transferred to facility by Sharin Mons(PTAR)     Patient family notified on (Phone call was attempted to mother) of transfer.  Name of family member notified:  [name] [phone]     PHYSICIAN Please sign FL2, Please prepare prescriptions      Additional Comment:    _______________________________________________ Arvin Collardara W Martell Mcfadyen, LCSWA 05/27/2017, 3:04 PM

## 2017-05-27 NOTE — Discharge Summary (Signed)
Physician Discharge Summary  Alexander [name] ZOX:[id number] DOB: 1955/12/16 DOA: 05/19/2017  PCP: [name], Gerardo E, MD  Admit date: 05/19/2017 Discharge date: 05/27/2017  Time spent: 35 minutes  Recommendations for Outpatient Follow-up:  1. Repeat BMET to follow electrolytes and renal function  2. Repeat CBC to follow Hgb and platelets trend    Discharge Diagnoses:  Septic shock (HCC) Acute respiratory failure with hypoxia (HCC) Acute UTI associated to Chronic indwelling Foley catheter Bacteremia HTN GERD BPH Depression Anxiety Severe protein calorie malnutrition  Thrombocytopenia   Discharge Condition: stable and improved. Discharge back to SNF for further care and rehabilitation.  Diet recommendation: continue Tube feedings and proper free water.  Filed Weights   05/25/17 0500 05/26/17 0420 05/27/17 0439  Weight: 76.3 kg (168 lb 3.4 oz) 74.3 kg (163 lb 14.4 oz) 74.2 kg (163 lb 9.3 oz)    History of present illness:  61yoM with h/o TBI and C1 cervical fracture, CVA, DM, HTN, PVD, Seizures, Multiple aspiration pneumonia, Chronic foley with multiple UTI's, Depression with multiple prior suicide attempts, Nonverbal baseline, presents today from his nursing home with fever (T:104), Hypotension, Tachycardia, and reported AMS (unclear baseline), who was found in the ER to be in septic shock due to UTI.  Hospital Course:  1-septic shock due to UTI and providence bacteremia: -patient shock status resolved -will continue Rocephin IV as instructed by ID (last day 06/02/17) -no fever and WBC's WNL now -continue supportive care -Mid line place in his right arm to facilitate treatment at SNF; plan is to discontinue line after antibiotics therapy completed. -repeat blood cx's has remained neg. -patient's UTI associated with chronic foley use.  2-acute hypoxic resp failure: most likely in the setting of ARDS with aggressive IVF's given during septic shock resuscitation.  Required ventilatory support from 11/17 >>11/21 -with good O2 sat on RA. -patient with hx of recurrent aspiration; but no infiltrates seen on repeat x-ray -continue supportive care and good pulmonary hygiene/toiletry  -breathing is back to normal and stable at discharge  3-acute toxic encephalopathy: from UTI most likely -resolved and back to his baseline now -patient nonverbal at baseline, but communicate with hands signal -continue to follow commands appropriately.  4-hypokalemia/hypophosphatemia -repleted -please repeat BMET and PO4 level, to follow electrolytes trend and further replete them as needed   5-anemia of chronic disease: with component of hemodilution -no signs of active bleeding -will recommend CBC to follow up Hgb trend.  6-thrombocytopenia -probably from sepsis -now resolved and WNL -recommend CBC to follow trend  -no active bleeding appreciated  7-type 2 diabetes -follow CBG's and A1C as an outpatient  -patient cover with [employer] while inpatient -not on any hypoglycemic regimen currently   8-chronic pain: -continue fentanyl patch and PRN oxycodone as previously prescribed   9-GERD/GI prophylaxis -will continue PPI   10-severe protein calorie malnutrition -continue tube feedings  -so far well tolerated and no increase in residuals appreciated  11-seizure disorder -no seizures appreciated  -continue current AE drugs.  12-depression.anxiety -no SI or hallucinations -mood stable -continue home regimen   13-hx of BPH -will continue proscar.   14-HTN -will continue lisinopril    Procedures:  Ventilatory dependency 11/17>>11/21  See below for x-ray reports  Consultations:  PCCM  ID ([facility], Dr. Ninetta LightsHatcher recommended continue IV antibiotics and treat for 14 days total; pharmacy also in agreement and corroboration with plan)   Discharge Exam: Vitals:   05/27/17 0507 05/27/17 0734  BP: 115/67   Pulse: 77   Resp:  14   Temp: 98.5  F (36.9 C)   SpO2: 97% 96%     General:  No fever, no CP, no abd pain, no nausea, no vomiting. Reports breathing is stable and is in no distress. No-verbal at baseline; communicate with nodding and hand gesture.  Cardiovascular: S1 and S2, no rubs, no gallops  Respiratory: good air movement, no wheezing, no crackles. Normal resp effort.  Abdomen: soft, NT, ND, positive BS; peg tube in place, no signs of infection, erythema or drainage appreciated.  Musculoskeletal: no cyanosis, no clubbing. No edema appreciated.    Discharge Instructions   Discharge Instructions    Discharge instructions   Complete by:  As directed    Maintain adequate hydration Repeat CBC and BMET in 1 week 6 more days of antibiotics at discharge. Continue good pulmonary toiletry   Home infusion instructions   Complete by:  As directed    Instructions:  Flushing of vascular access device: 0.9% NaCl pre/post medication administration and prn patency; Heparin 100 u/ml, 5ml for implanted ports and Heparin 10u/ml, 5ml for all other central venous catheters.     Current Discharge Medication List    START taking these medications   Details  cefTRIAXone (ROCEPHIN) IVPB Inject 2 g into the vein daily for 6 days. Indication:  Providence UTI and bacteremia Last Day of Therapy:  06/02/17 Labs - Once weekly:  CBC/D and BMP      CONTINUE these medications which have CHANGED   Details  fentaNYL (DURAGESIC - DOSED MCG/HR) 50 MCG/HR Place 1 patch (50 mcg total) onto the skin every 3 (three) days. Qty: 2 patch, Refills: 0    Oxycodone HCl 10 MG TABS Place 1 tablet (10 mg total) into feeding tube every 8 (eight) hours as needed (For pain.). Qty: 10 tablet, Refills: 0      CONTINUE these medications which have NOT CHANGED   Details  acetaminophen (TYLENOL) 650 MG CR tablet Take 650 mg by mouth every 4 (four) hours as needed for pain.     allopurinol (ZYLOPRIM) 300 MG tablet 300 mg by PEG Tube route daily.      busPIRone (BUSPAR) 15 MG tablet Place 15 mg 2 (two) times daily into feeding tube.     carboxymethylcellulose (REFRESH PLUS) 0.5 % SOLN Place 1 drop into both eyes daily.     celecoxib (CELEBREX) 200 MG capsule Place 200 mg into feeding tube daily.     finasteride (PROSCAR) 5 MG tablet Take 5 mg See admin instructions by mouth. Take 1 tablet (5mg ) via tube once daily (may crush)    fluticasone (FLONASE) 50 MCG/ACT nasal spray Place 1 spray into both nostrils daily.     gabapentin (NEURONTIN) 250 MG/5ML solution Take 300 mg 3 (three) times daily by mouth.    ipratropium-albuterol (DUONEB) 0.5-2.5 (3) MG/3ML SOLN Take 3 mLs by nebulization every 6 (six) hours as needed (for shortness of breath).    lacosamide (VIMPAT) 10 MG/ML oral solution Take 100 mg 2 (two) times daily by mouth.    levETIRAcetam (KEPPRA) 100 MG/ML solution Place 250 mg into feeding tube 2 (two) times daily.     lidocaine (XYLOCAINE) 5 % ointment Apply 1 application topically daily. Applied around G-Tube    lisinopril (PRINIVIL,ZESTRIL) 2.5 MG tablet Take 2.5 mg by mouth daily.    Melatonin 10 MG TABS Give 10 mg by tube at bedtime.     Menthol, Topical Analgesic, (BIOFREEZE EX) Apply 1 application topically every 8 (eight) hours.  Nutritional Supplements (ISOSOURCE 1.5 CAL) LIQD Give by tube. 40 mL/hr x13 hours    omeprazole (PRILOSEC) 40 MG capsule Take 40 mg See admin instructions by mouth. Give suspension via PEG tube daily GERD.    OXcarbazepine (TRILEPTAL) 300 MG/5ML suspension Place 300 mg at bedtime into feeding tube.     Phenylephrine-APAP-Guaifenesin (MUCINEX FAST-MAX) 10-650-400 MG/20ML LIQD Place 15 mLs into feeding tube every 12 (twelve) hours.    pneumococcal 13-valent conjugate vaccine (PREVNAR 13) SUSP injection Inject 0.5 mLs once into the muscle.    Polyethyl Glycol-Propyl Glycol (SYSTANE) 0.4-0.3 % SOLN Apply 1 drop daily to eye.    polyethylene glycol (MIRALAX / GLYCOLAX) packet Place 17 g  into feeding tube daily. Mix with 8 oz water and place in tube every night at bedtime Qty: 14 each, Refills: 0    saccharomyces boulardii (FLORASTOR) 250 MG capsule Take 1 capsule (250 mg total) by mouth 2 (two) times daily. Qty: 60 capsule, Refills: 0    topiramate (TOPAMAX) 100 MG tablet Take 1 tablet (100 mg total) by mouth 2 (two) times daily. PEG tube Qty: 60 tablet, Refills: 6   Associated Diagnoses: Seizure disorder (HCC)    traZODone (DESYREL) 100 MG tablet 100 mg by PEG Tube route at bedtime.     venlafaxine (EFFEXOR) 100 MG tablet 75 mg by PEG Tube route 3 (three) times daily.     Vitamin D, Ergocalciferol, (DRISDOL) 50000 units CAPS capsule Take 50,000 Units by mouth every 30 (thirty) days.       Allergies  Allergen Reactions  . Amoxicillin Itching, Rash and Other (See Comments)    NOTED ON MAR Has patient had a PCN reaction causing immediate rash, facial/tongue/throat swelling, SOB or lightheadedness with hypotension: Unknown Has patient had a PCN reaction causing severe rash involving mucus membranes or skin necrosis: Unknown Has patient had a PCN reaction that required hospitalization Unknown Has patient had a PCN reaction occurring within the last 10 years: Unknown If all of the above answers are "NO", then may proceed with Cephalosporin use.    Follow-up Information    Durenda Hurt, Flossie Buffy, MD. Schedule an appointment as soon as possible for a visit in 10 day(s).   Specialty:  Internal Medicine Contact information: [address] [place] [address] [phone]           The results of significant diagnostics from this hospitalization (including imaging, microbiology, ancillary and laboratory) are listed below for reference.    Significant Diagnostic Studies: Dg Chest Port 1 View  Result Date: 05/24/2017 CLINICAL DATA:  Respiratory failure EXAM: PORTABLE CHEST 1 VIEW COMPARISON:  05/23/2017 FINDINGS: Endotracheal tube removed. Mild improvement  in left lower lobe airspace disease. Progressive atelectasis/ consolidation right lower lobe. Small bilateral effusions unchanged IMPRESSION: Bibasilar airspace disease, with progression on the right and mild improvement on the left. Endotracheal tube removed. Electronically Signed   By: Marlan Palau M.D.   On: 05/24/2017 07:11   Dg Chest Port 1 View  Result Date: 05/23/2017 CLINICAL DATA:  Hypoxia EXAM: PORTABLE CHEST 1 VIEW COMPARISON:  May 22, 2017 FINDINGS: Endotracheal tube tip is 3.4 cm above the carina. No pneumothorax. There are small pleural effusions bilaterally with mild bibasilar atelectasis. There is no frank edema or consolidation. Heart is upper normal in size with pulmonary vascularity within normal limits. No adenopathy. There is aortic atherosclerosis. There is an old healed fracture of the anterior left second rib. There is postoperative change in the region of the coracoid process  of the left scapula. IMPRESSION: Small pleural effusions with mild bibasilar atelectasis. No airspace consolidation. Stable cardiac silhouette. There is aortic atherosclerosis. Endotracheal tube as described without evident pneumothorax. Aortic Atherosclerosis (ICD10-I70.0). Electronically Signed   By: Bretta Bang III M.D.   On: 05/23/2017 07:33   Dg Chest Port 1 View  Result Date: 05/22/2017 CLINICAL DATA:  Respiratory failure EXAM: PORTABLE CHEST 1 VIEW COMPARISON:  Yesterday FINDINGS: Endotracheal tube tip between the clavicular heads and carina. Low volume chest with hazy opacities at the bases, improved from prior with better visualized diaphragm. No effusion or pneumothorax. Grossly stable mediastinal contours allowing for rightward rotation. IMPRESSION: 1. Atelectasis or pneumonia at the bases of the low volume lungs. Aeration is improved from yesterday. 2. Stable endotracheal tube position. Electronically Signed   By: Marnee Spring M.D.   On: 05/22/2017 08:30   Dg Chest Port 1  View  Result Date: 05/21/2017 CLINICAL DATA:  Hypoxia EXAM: PORTABLE CHEST 1 VIEW COMPARISON:  May 20, 2017 FINDINGS: Endotracheal tube tip is 2.9 cm above the carina. No pneumothorax. A skin fold is noted on the right. There are pleural effusions bilaterally with patchy airspace consolidation in the bases. Heart is upper normal in size. There is mild pulmonary venous hypertension. No adenopathy. There old healed rib fractures on the right, unchanged. IMPRESSION: Endotracheal tube as described without pneumothorax. Small pleural effusions with patchy airspace opacity, likely pneumonia, in the bases. There is felt to be a degree of underlying pulmonary vascular congestion. Skin fold noted on the right. Electronically Signed   By: Bretta Bang III M.D.   On: 05/21/2017 07:08   Dg Chest Port 1 View  Result Date: 05/20/2017 CLINICAL DATA:  Respiratory failure EXAM: PORTABLE CHEST 1 VIEW COMPARISON:  05/19/2017 FINDINGS: Endotracheal tube is in stable position. Heart is borderline in size. Vascular congestion with interstitial prominence, likely mild interstitial edema. Right base atelectasis or infiltrate. Suspect small effusions. IMPRESSION: Mild interstitial prominence concerning for interstitial edema. Right lower lobe atelectasis or infiltrate, similar prior study. Small bilateral effusions. Electronically Signed   By: Charlett Nose M.D.   On: 05/20/2017 08:17   Dg Chest Port 1 View  Result Date: 05/19/2017 CLINICAL DATA:  ET tube placement.  Respiratory failure with hypoxia EXAM: PORTABLE CHEST 1 VIEW COMPARISON:  05/19/2017 FINDINGS: Endotracheal tube has been retracted. The tip is 3 cm above the carina. Bibasilar atelectasis again noted, unchanged. Heart is normal size. No visible effusions. IMPRESSION: Endotracheal tube 3 cm above the carina. Continued bibasilar atelectasis. Electronically Signed   By: Charlett Nose M.D.   On: 05/19/2017 11:48   Dg Chest Portable 1 View  Result Date:  05/19/2017 CLINICAL DATA:  ET tube placement EXAM: PORTABLE CHEST 1 VIEW COMPARISON:  05/19/2017 FINDINGS: Endotracheal tube is in the right mainstem bronchus. Recommend retracting approximately 3 cm. Heart is normal size. Bibasilar opacities, likely atelectasis. No acute bony abnormality. IMPRESSION: Endotracheal tube in the right mainstem bronchus. Recommend retracting approximately 3 cm. Bibasilar atelectasis. Electronically Signed   By: Charlett Nose M.D.   On: 05/19/2017 11:48   Dg Chest Portable 1 View  Result Date: 05/19/2017 CLINICAL DATA:  Encounter for shortness of Breath EXAM: PORTABLE CHEST 1 VIEW COMPARISON:  03/03/2017 FINDINGS: Interval removal of left central line. There is bibasilar atelectasis. Low lung volumes. Heart is normal size. Possible small effusions. IMPRESSION: Low lung volumes with bibasilar atelectasis and possible small effusions. Electronically Signed   By: Charlett Nose M.D.   On: 05/19/2017  07:33   Dg Abd Portable 1v  Result Date: 05/19/2017 CLINICAL DATA:  Possible ileus. Possible sepsis. Pt has a peg tube with redness and draining around the site. EXAM: PORTABLE ABDOMEN - 1 VIEW COMPARISON:  04/14/2017 FINDINGS: Balloon retention gastrostomy catheter projects in expected location. Stomach and small bowel decompressed. Moderate colonic and rectal fecal material. Cholecystectomy clips. Degenerative changes in the lumbar spine and hips. IMPRESSION: 1. Nonobstructive bowel gas pattern with moderate colonic and rectal fecal material. Electronically Signed   By: Corlis Leak M.D.   On: 05/19/2017 12:45    Microbiology: Recent Results (from the past 240 hour(s))  Culture, blood (Routine x 2)     Status: Abnormal   Collection Time: 05/19/17  7:20 AM  Result Value Ref Range Status   Specimen Description BLOOD RIGHT WRIST  Final   Special Requests   Final    BOTTLES DRAWN AEROBIC AND ANAEROBIC Blood Culture adequate volume   Culture  Setup Time   Final    GRAM NEGATIVE  RODS IN BOTH AEROBIC AND ANAEROBIC BOTTLES CRITICAL RESULT CALLED TO, READ BACK BY AND VERIFIED WITH: K. WEIGLE,PHARMD [id number] 05/20/2017 T. TYSOR    Culture PROVIDENCIA STUARTII (A)  Final   Report Status 05/22/2017 FINAL  Final   Organism ID, Bacteria PROVIDENCIA STUARTII  Final      Susceptibility   Providencia stuartii - MIC*    AMPICILLIN >=32 RESISTANT Resistant     CEFAZOLIN >=64 RESISTANT Resistant     CEFEPIME <=1 SENSITIVE Sensitive     CEFTAZIDIME <=1 SENSITIVE Sensitive     CEFTRIAXONE <=1 SENSITIVE Sensitive     CIPROFLOXACIN >=4 RESISTANT Resistant     GENTAMICIN RESISTANT Resistant     IMIPENEM 1 SENSITIVE Sensitive     TRIMETH/SULFA >=320 RESISTANT Resistant     AMPICILLIN/SULBACTAM >=32 RESISTANT Resistant     PIP/TAZO <=4 SENSITIVE Sensitive     * PROVIDENCIA STUARTII  Blood Culture ID Panel (Reflexed)     Status: None   Collection Time: 05/19/17  7:20 AM  Result Value Ref Range Status   Enterococcus species NOT DETECTED NOT DETECTED Final   Listeria monocytogenes NOT DETECTED NOT DETECTED Final   Staphylococcus species NOT DETECTED NOT DETECTED Final   Staphylococcus aureus NOT DETECTED NOT DETECTED Final   Streptococcus species NOT DETECTED NOT DETECTED Final   Streptococcus agalactiae NOT DETECTED NOT DETECTED Final   Streptococcus pneumoniae NOT DETECTED NOT DETECTED Final   Streptococcus pyogenes NOT DETECTED NOT DETECTED Final   Acinetobacter baumannii NOT DETECTED NOT DETECTED Final   Enterobacteriaceae species NOT DETECTED NOT DETECTED Final   Enterobacter cloacae complex NOT DETECTED NOT DETECTED Final   Escherichia coli NOT DETECTED NOT DETECTED Final   Klebsiella oxytoca NOT DETECTED NOT DETECTED Final   Klebsiella pneumoniae NOT DETECTED NOT DETECTED Final   Proteus species NOT DETECTED NOT DETECTED Final   Serratia marcescens NOT DETECTED NOT DETECTED Final   Haemophilus influenzae NOT DETECTED NOT DETECTED Final   Neisseria meningitidis NOT  DETECTED NOT DETECTED Final   Pseudomonas aeruginosa NOT DETECTED NOT DETECTED Final   Candida albicans NOT DETECTED NOT DETECTED Final   Candida glabrata NOT DETECTED NOT DETECTED Final   Candida krusei NOT DETECTED NOT DETECTED Final   Candida parapsilosis NOT DETECTED NOT DETECTED Final   Candida tropicalis NOT DETECTED NOT DETECTED Final  Culture, blood (Routine x 2)     Status: None   Collection Time: 05/19/17  8:01 AM  Result Value Ref Range Status  Specimen Description BLOOD LEFT HAND  Final   Special Requests IN PEDIATRIC BOTTLE Blood Culture adequate volume  Final   Culture NO GROWTH 5 DAYS  Final   Report Status 05/24/2017 FINAL  Final  Culture, respiratory (tracheal aspirate)     Status: None   Collection Time: 05/19/17 11:46 AM  Result Value Ref Range Status   Specimen Description TRACHEAL ASPIRATE  Final   Special Requests NONE  Final   Gram Stain   Final    ABUNDANT WBC PRESENT,BOTH PMN AND MONONUCLEAR RARE SQUAMOUS EPITHELIAL CELLS PRESENT MODERATE GRAM POSITIVE RODS FEW GRAM NEGATIVE RODS FEW BUDDING YEAST SEEN    Culture Consistent with normal respiratory flora.  Final   Report Status 05/21/2017 FINAL  Final  MRSA PCR Screening     Status: Abnormal   Collection Time: 05/19/17  1:00 PM  Result Value Ref Range Status   MRSA by PCR POSITIVE (A) NEGATIVE Final    Comment:        The GeneXpert MRSA Assay (FDA approved for NASAL specimens only), is one component of a comprehensive MRSA colonization surveillance program. It is not intended to diagnose MRSA infection nor to guide or monitor treatment for MRSA infections. RESULT CALLED TO, READ BACK BY AND VERIFIED WITH: F.MPAY RN AT 1510 05/19/17 BY A.DAVIS   Urine culture     Status: Abnormal   Collection Time: 05/19/17  5:50 PM  Result Value Ref Range Status   Specimen Description URINE, CATHETERIZED  Final   Special Requests NONE  Final   Culture <10,000 COLONIES/mL INSIGNIFICANT GROWTH (A)  Final    Report Status 05/20/2017 FINAL  Final  Culture, blood (routine x 2)     Status: None (Preliminary result)   Collection Time: 05/22/17  3:00 PM  Result Value Ref Range Status   Specimen Description BLOOD RIGHT ANTECUBITAL  Final   Special Requests   Final    BOTTLES DRAWN AEROBIC AND ANAEROBIC Blood Culture adequate volume   Culture NO GROWTH 4 DAYS  Final   Report Status PENDING  Incomplete  Culture, blood (routine x 2)     Status: None (Preliminary result)   Collection Time: 05/22/17  3:15 PM  Result Value Ref Range Status   Specimen Description BLOOD BLOOD RIGHT FOREARM  Final   Special Requests   Final    BOTTLES DRAWN AEROBIC AND ANAEROBIC Blood Culture adequate volume   Culture NO GROWTH 4 DAYS  Final   Report Status PENDING  Incomplete     Labs: Basic Metabolic Panel: Recent Labs  Lab 05/21/17 0355  05/21/17 1646 05/22/17 0357 05/22/17 1241 05/23/17 0509 05/24/17 0205 05/25/17 0242 05/26/17 0422  NA 137   < >  --  139 143 140 143 143 144  K 2.5*   < >  --  3.0* 3.6 3.2* 3.9 3.5 3.6  CL 113*   < >  --  115* 116* 116* 115* 115* 113*  CO2 18*   < >  --  17* 23 16* 23 23 25   GLUCOSE 136*   < >  --  126* 112* 118* 89 100* 101*  BUN 32*   < >  --  30* 29* 28* 29* 31* 32*  CREATININE 0.70   < >  --  0.65 0.66 0.59* 0.91 0.92 0.83  CALCIUM 8.3*   < >  --  8.3* 8.4* 8.4* 8.5* 8.6* 8.8*  MG 2.0  --  1.9 1.9  --  1.9 2.0 2.2  --  PHOS 2.1*  --  4.2  --   --  2.2* 3.9  4.0 3.4  --    < > = values in this interval not displayed.   Liver Function Tests: Recent Labs  Lab 05/23/17 0509 05/24/17 0205 05/25/17 0242  ALBUMIN 2.4* 2.4* 2.5*   CBC: Recent Labs  Lab 05/22/17 0357 05/23/17 0509 05/24/17 0205 05/25/17 0242 05/26/17 0422  WBC 6.1 6.8 9.3 9.0 8.9  NEUTROABS 4.0 4.7 6.5 6.3  --   HGB 9.4* 9.7* 10.0* 10.2* 9.7*  HCT 29.1* 29.8* 31.3* 32.1* 31.0*  MCV 94.8 96.4 97.2 98.5 99.4  PLT 103* 115* 149* 182 211   BNP (last 3 results) Recent Labs     05/19/17 1516  BNP 204.3*   CBG: Recent Labs  Lab 05/26/17 1602 05/26/17 2023 05/27/17 0015 05/27/17 0434 05/27/17 0740  GLUCAP 106* 93 91 91 108*    Signed:  Vassie Loll MD.  [facility] 05/27/2017, 9:17 AM

## 2017-05-27 NOTE — Discharge Instructions (Signed)
Bacteremia °Bacteremia is the presence of bacteria in the blood. When bacteria enter the bloodstream, they can cause a life-threatening reaction called sepsis, which is a medical emergency. Bacteremia can spread to other parts of the body, including the heart, joints, and brain. °What are the causes? °This condition is caused by bacteria that get into the blood. Bacteria can enter the blood: °· From a skin infection or a cut on your skin. °· During an episode of pneumonia. °· From an infection in your stomach or intestine (gastrointestinal infection). °· From an infection in your bladder or urinary system (urinary tract infection). °· During a dental or medical procedure. °· After you brush your teeth so hard that your gums bleed. °· When a bacterial infection in another part of the body spreads to the blood. °· Through a dirty needle. ° °What increases the risk? °This condition is more likely to develop in: °· Children. °· Elderly adults. °· People who have a long-lasting (chronic) disease or medical condition. °· People who have an artificial joint or heart valve. °· People who have heart valve disease. °· People who have a tube, such as a catheter or IV tube, that has been inserted for a medical treatment. °· People who have a weak body defense system (immune system). °· People who use IV drugs. ° °What are the signs or symptoms? °Symptoms of this condition include: °· Fever. °· Chills. °· A racing heart. °· Shortness of breath. °· Dizziness. °· Weakness. °· Confusion. °· Nausea or vomiting. °· Diarrhea. ° °In some cases, there are no symptoms. Bacteremia that has spread to the other parts of the body may cause symptoms in those areas. °How is this diagnosed? °This condition may be diagnosed with a physical exam and tests, such as: °· A complete blood count (CBC). This test looks for signs of infection. °· Blood cultures. These look for bacteria in your blood. °· Tests of any tubes that you may have inserted into  your body, such as an IV tube or urinary catheter. These tests look for a source of infection. °· Urine tests including urine cultures. These look for bacteria in the urine that could be a source of infection. °· Imaging tests, such as an X-ray, CT scan, MRI, or heart ultrasound. These look for a source of infection in other parts of the body, such as the lungs, heart valves, or joints. ° °How is this treated? °This condition may be treated with: °· Antibiotic medicines given through an IV infusion. Depending on the source of infection, antibiotics may be needed for several weeks. At first, an antibiotic may be given to kill most types of blood bacteria. If your test results show that a certain kind of bacteria is causing problems, the antibiotic may be changed to kill only the bacteria that are causing problems. °· Antibiotics taken by mouth. °· IV fluids to support the body as you fight the infection. °· Removing any catheter or device that could be a source of infection. °· Blood pressure and breathing support, if you have sepsis. °· Surgery to control the source or spread of infection, such as: °? Removing an infected implanted device. °? Removing infected tissue or an abscess. ° °This condition is usually treated at a hospital. If you are treated at home, you may need to come back for medicines, blood tests, and evaluation. This is important. °Follow these instructions at home: °· Take over-the-counter and prescription medicines only as told by your health care provider. °·   If you were prescribed an antibiotic, take it as told by your health care provider. Do not stop taking the antibiotic even if you start to feel better. °· Rest until your condition is under control. °· Drink enough fluid to keep your urine clear or pale yellow. °· Do not smoke. If you need help quitting, ask your health care provider. °· Keep all follow-up visits as told by your health care provider. This is important. °How is this  prevented? °· Get the vaccinations that your health care provider recommends. °· Clean and cover any scrapes or cuts. °· Take good care of your skin. This includes regular bathing and moisturizing. °· Wash your hands often. °· Practice good oral hygiene. Brush your teeth two times a day and floss regularly. °Get help right away if: °· You have pain. °· You have a fever. °· You have trouble breathing. °· Your skin becomes blotchy, pale, or clammy. °· You develop confusion, dizziness, or weakness. °· You develop diarrhea. °· You develop any new symptoms after treatment. °Summary °· Bacteremia is the presence of bacteria in the blood. When bacteria enter the bloodstream, they can cause a life- threatening reaction called sepsis. °· Children and elderly adults are at increased risk of bacteremia. Other risk factors include having a long-lasting (chronic) disease or a weak immune system, having an artificial joint or heart valve, having heart valve disease, having tubes that were inserted in the body for medical treatment, or using IV drugs. °· Some symptoms of bacteremia include fever, chills, shortness of breath, confusion, nausea or vomiting, and diarrhea. °· Tests may be done to diagnose a source of infection that led to bacteremia. These tests may include blood tests, urine tests, and imaging tests. °· Bacteremia is usually treated with antibiotics, usually in a hospital. °This information is not intended to replace advice given to you by your health care provider. Make sure you discuss any questions you have with your health care provider. °Document Released: 04/02/2006 Document Revised: 05/16/2016 Document Reviewed: 05/16/2016 °Elsevier Interactive Patient Education © 2018 [employer] ° °

## 2017-05-27 NOTE — NC FL2 (Signed)
[place] MEDICAID FL2 LEVEL OF CARE SCREENING TOOL     IDENTIFICATION  Patient Name: Alexander [name] Birthdate: 05/06/1956 Sex: male Admission Date (Current Location): 05/19/2017  [facility] [place]   Facility and Address:  The [facility], [address], [place], [place] [address]      Provider Number: [id number]  Attending Physician Name and Address:  Vassie LollMadera, Carlos, MD  Relative Name and Phone Number:  Nona DellOra Jann [phone]    Current Level of Care: Hospital Recommended Level of Care: Skilled Nursing Facility Prior Approval Number:    Date Approved/Denied:   PASRR Number:    Discharge Plan: SNF    Current Diagnoses: Patient Active Problem List   Diagnosis Date Noted  . Sepsis (HCC)   . Chronic indwelling Foley catheter   . Acute lower UTI   . Urinary tract infection with hematuria   . Bacteremia   . Suicide attempt (HCC) 03/15/2017  . Chronic pain syndrome 03/08/2017  . At risk for adverse drug event 03/08/2017  . Acute respiratory failure with hypoxia (HCC)   . Pressure injury of skin 03/01/2017  . Septic shock (HCC) 02/28/2017  . Acute respiratory failure with hypoxemia (HCC) 02/28/2017  . Altered mental status   . Chronic bronchitis (HCC) 10/04/2015  . Constipation 09/22/2015  . Pain in testicle 08/09/2015  . Insomnia 08/02/2015  . Urinary retention 07/26/2015  . BPH (benign prostatic hyperplasia) 07/26/2015  . Dysuria 07/19/2015  . Expressive aphasia 07/19/2015  . Decubitus ulcer of sacral area 11/19/2014  . Seborrhea capitis 11/19/2014  . 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 Hypercholesterolemia without hypertriglyceridemia 11/04/2014  . 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 TBI (traumatic brain injury) (HCC) 05/24/2014  . Diabetes mellitus, type 2 (HCC) 10/10/2013  . HCAP (healthcare-associated pneumonia) 06/11/2013  . Cervical myelopathy (HCC) 10/09/2012  . C1  cervical fracture (HCC) 10/09/2012  . 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 Seizure (HCC)     Orientation RESPIRATION BLADDER Height & Weight     Self, Time, Situation, Place  Normal Incontinent Weight: 163 lb 9.3 oz (74.2 kg) Height:  6\' 1"  (185.4 cm)  BEHAVIORAL SYMPTOMS/MOOD NEUROLOGICAL BOWEL NUTRITION STATUS      Incontinent Feeding tube  AMBULATORY STATUS COMMUNICATION OF NEEDS Skin   Total Care Non-Verbally Skin abrasions                       Personal Care Assistance Level of Assistance  Bathing, Dressing, Feeding Bathing Assistance: Maximum assistance Feeding assistance: Maximum assistance Dressing Assistance: Maximum assistance     Functional Limitations Info  Speech     Speech Info: Impaired(Non verbal)    SPECIAL CARE FACTORS FREQUENCY                       Contractures Contractures Info: Not present    Additional Factors Info  Allergies, Isolation Precautions(Allergy: Amoxicillin and  MRSA)   Allergies Info: (Amoxicillin)     Isolation Precautions Info: (MRSA)     Current Medications (05/27/2017):  This is the current hospital active medication list Current Facility-Administered Medications  Medication Dose Route Frequency Provider Last Rate Last Dose  . 0.9 %  sodium chloride infusion  250 mL Intravenous PRN Hammonds, Curt JewsKathleen H, MD 10 mL/hr at 05/24/17 1900 250 mL at 05/24/17 1900  . acetaminophen (TYLENOL) solution 650 mg  650 mg Per Tube Q4H PRN Hammonds, Nicholos JohnsKathleen  H, MD   650 mg at 05/25/17 2219  . albuterol (PROVENTIL) (2.5 MG/3ML) 0.083% nebulizer solution 2.5 mg  2.5 mg Nebulization Q2H PRN Hammonds, Curt Jews, MD      . busPIRone (BUSPAR) tablet 15 mg  15 mg Per Tube BID Coralyn Helling, MD   15 mg at 05/27/17 0936  . cefTRIAXone (ROCEPHIN) 2 g in dextrose 5 % 50 mL IVPB  2 g Intravenous Q24H Roslynn Amble, MD 100 mL/hr at 05/26/17 1826 2 g at 05/26/17 1826  . docusate (COLACE) 50  MG/5ML liquid 100 mg  100 mg Per Tube BID PRN Hammonds, Curt Jews, MD      . docusate (COLACE) 50 MG/5ML liquid 100 mg  100 mg Per Tube BID Roslynn Amble, MD   100 mg at 05/27/17 0936  . feeding supplement (VITAL AF 1.2 CAL) liquid 1,000 mL  1,000 mL Per Tube Continuous Roslynn Amble, MD 65 mL/hr at 05/27/17 1339 1,000 mL at 05/27/17 1339  . fentaNYL (DURAGESIC - dosed mcg/hr) 50 mcg  50 mcg Transdermal Q72H Vassie Loll, MD   50 mcg at 05/25/17 1313  . finasteride (PROSCAR) tablet 5 mg  5 mg Oral Daily Roslynn Amble, MD   5 mg at 05/27/17 1610  . furosemide (LASIX) injection 20 mg  20 mg Intravenous Daily Vassie Loll, MD   20 mg at 05/27/17 1001  . gabapentin (NEURONTIN) 250 MG/5ML solution 300 mg  300 mg Per Tube TID Hammonds, Curt Jews, MD   300 mg at 05/27/17 0959  . heparin injection 5,000 Units  5,000 Units Subcutaneous Q8H Hammonds, Curt Jews, MD   5,000 Units at 05/27/17 1335  . insulin aspart (novoLOG) injection 0-9 Units  0-9 Units Subcutaneous Q4H Roslynn Amble, MD   2 Units at 05/21/17 1601  . lacosamide (VIMPAT) tablet 100 mg  100 mg Per Tube BID Roslynn Amble, MD   100 mg at 05/27/17 0936  . levETIRAcetam (KEPPRA) 100 MG/ML solution 250 mg  250 mg Per Tube BID Hammonds, Curt Jews, MD   250 mg at 05/27/17 0936  . MEDLINE mouth rinse  15 mL Mouth Rinse BID Roslynn Amble, MD   15 mL at 05/27/17 1001  . Melatonin TABS 9 mg  9 mg Tube QHS Coralyn Helling, MD   9 mg at 05/26/17 2124  . ondansetron (ZOFRAN) injection 4 mg  4 mg Intravenous Q6H PRN Hammonds, Curt Jews, MD      . OXcarbazepine (TRILEPTAL) 300 MG/5ML suspension 300 mg  300 mg Per Tube QHS Coralyn Helling, MD   300 mg at 05/26/17 2124  . pantoprazole sodium (PROTONIX) 40 mg/20 mL oral suspension 40 mg  40 mg Per Tube Q24H Coralyn Helling, MD   40 mg at 05/27/17 1335  . saccharomyces boulardii (FLORASTOR) capsule 250 mg  250 mg Oral BID Vassie Loll, MD   250 mg at 05/27/17 0936  . sennosides  (SENOKOT) 8.8 MG/5ML syrup 5 mL  5 mL Per Tube QHS Roslynn Amble, MD   5 mL at 05/26/17 2124  . sodium chloride flush (NS) 0.9 % injection 10-40 mL  10-40 mL Intracatheter PRN Vassie Loll, MD      . topiramate (TOPAMAX) tablet 100 mg  100 mg Per Tube BID Hammonds, Curt Jews, MD   100 mg at 05/27/17 0936  . traZODone (DESYREL) tablet 100 mg  100 mg Per Tube QHS Coralyn Helling, MD   100 mg at 05/26/17 2125  .  venlafaxine (EFFEXOR) tablet 75 mg  75 mg Per Tube TID Coralyn HellingSood, Vineet, MD   75 mg at 05/27/17 0935     Discharge Medications: Please see discharge summary for a list of discharge medications.  Relevant Imaging Results:  Relevant Lab Results:   Additional Information    Arvin Collardara W Ritvik Mczeal, LCSWA

## 2017-05-27 NOTE — Clinical Social Work Note (Signed)
Clinical Social Work Assessment  Patient Details  Name: Alexander FreudMartin A Holdman MRN: [id number] Date of Birth: 1955/07/13  Date of referral:  05/27/17               Reason for consult:  Facility Placement                Permission sought to share information with:  Family Supports, Case Manager Permission granted to share information::  Yes, Verbal Permission Granted  Name::     Nona Dellra Uzelac, mother  Agency::  [facility]  Relationship::  Nona Dell(Ora Marcus, mother [phone])  Contact Information:  [facility] [place]  Housing/Transportation Living arrangements for the past 2 months:  Skilled Nursing Facility Source of Information:  Parent, Patient Patient Interpreter Needed:  None Criminal Activity/Legal Involvement Pertinent to Current Situation/Hospitalization:  No - Comment as needed Significant Relationships:  Parents, Siblings Lives with:  Facility Resident Do you feel safe going back to the place where you live?  Yes Need for family participation in patient care:  Yes (Comment)(Pt is non verbal)  Care giving concerns: Pt is immobile and non verbal   Office managerocial Worker assessment / plan: CSW spoke with patient. [facility]  Patient is oriented to self, place and time. Patient is non verbal but uses non verbal cues when asked.  Employment status:  Retired Health and safety inspectornsurance information:  Managed Care PT Recommendations:  Not assessed at this time Information / Referral to community resources:  Skilled Nursing Facility  Patient/Family's Response to care:  Mother, Nona DellOra Dietze is accepting patient plan of care.  Patient/Family's Understanding of and Emotional Response to Diagnosis, Current Treatment, and Prognosis: Patient's mother is agreeable to SNF placement.  Emotional Assessment Appearance:  Appears stated age Attitude/Demeanor/Rapport:  (Pt was cooperative/  Used hand and head gestures for  communication) Affect (typically observed):  Accepting, Appropriate Orientation:  Oriented to Self, Oriented to Place, Oriented to  Time, Oriented to Situation Alcohol / Substance use:  Not Applicable Psych involvement (Current and /or in the community):  No (Comment)  Discharge Needs  Concerns to be addressed:  Basic Needs, Mental Health Concerns Readmission within the last 30 days:  No Current discharge risk:  Other(Depression and anxiety) Barriers to Discharge:  No Barriers Identified   Arvin Collardara W Jovonne Wilton, LCSWA 05/27/2017, 2:40 PM

## 2017-05-27 NOTE — NC FL2 (Cosign Needed)
[place] MEDICAID FL2 LEVEL OF CARE SCREENING TOOL     IDENTIFICATION  Patient Name: Alexander FreudMartin A Keil Birthdate: March 08, 1956 Sex: male Admission Date (Current Location): 05/19/2017  [facility] [place]   Facility and Address:  [facility] [place], [address], [place], [place] [address]      Provider Number: [id number]  Attending Physician Name and Address:  Vassie LollMadera, Carlos, MD  Relative Name and Phone Number:  Nona DellOra Aldaba [phone]    Current Level of Care: Hospital Recommended Level of Care: Skilled Nursing Facility Prior Approval Number:    Date Approved/Denied:   PASRR Number:    Discharge Plan: SNF    Current Diagnoses: Patient Active Problem List   Diagnosis Date Noted  . Sepsis (HCC)   . Chronic indwelling Foley catheter   . Acute lower UTI   . Urinary tract infection with hematuria   . Bacteremia   . Suicide attempt (HCC) 03/15/2017  . Chronic pain syndrome 03/08/2017  . At risk for adverse drug event 03/08/2017  . Acute respiratory failure with hypoxia (HCC)   . Pressure injury of skin 03/01/2017  . Septic shock (HCC) 02/28/2017  . Acute respiratory failure with hypoxemia (HCC) 02/28/2017  . Altered mental status   . Chronic bronchitis (HCC) 10/04/2015  . Constipation 09/22/2015  . Pain in testicle 08/09/2015  . Insomnia 08/02/2015  . Urinary retention 07/26/2015  . BPH (benign prostatic hyperplasia) 07/26/2015  . Dysuria 07/19/2015  . Expressive aphasia 07/19/2015  . Decubitus ulcer of sacral area 11/19/2014  . Seborrhea capitis 11/19/2014  . 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 Hypercholesterolemia without hypertriglyceridemia 11/04/2014  . 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 TBI (traumatic brain injury) (HCC) 05/24/2014  . Diabetes mellitus, type 2 (HCC) 10/10/2013  . HCAP (healthcare-associated pneumonia) 06/11/2013  . Cervical myelopathy (HCC) 10/09/2012  . C1  cervical fracture (HCC) 10/09/2012  . 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 Seizure (HCC)     Orientation RESPIRATION BLADDER Height & Weight     Self, Time, Situation, Place  Normal Incontinent Weight: 163 lb 9.3 oz (74.2 kg) Height:  6\' 1"  (185.4 cm)  BEHAVIORAL SYMPTOMS/MOOD NEUROLOGICAL BOWEL NUTRITION STATUS      Incontinent Feeding tube  AMBULATORY STATUS COMMUNICATION OF NEEDS Skin   Total Care Non-Verbally Skin abrasions                       Personal Care Assistance Level of Assistance  Bathing, Dressing, Feeding Bathing Assistance: Maximum assistance Feeding assistance: Maximum assistance Dressing Assistance: Maximum assistance     Functional Limitations Info  Speech     Speech Info: Impaired(Non verbal)    SPECIAL CARE FACTORS FREQUENCY                       Contractures Contractures Info: Not present    Additional Factors Info  Allergies, Isolation Precautions(Allergy: Amoxicillin and  MRSA)   Allergies Info: (Amoxicillin)     Isolation Precautions Info: (MRSA)     Current Medications (05/27/2017):  This is the current hospital active medication list Current Facility-Administered Medications  Medication Dose Route Frequency Provider Last Rate Last Dose  . 0.9 %  sodium chloride infusion  250 mL Intravenous PRN Hammonds, Curt JewsKathleen H, MD 10 mL/hr at 05/24/17 1900 250 mL at 05/24/17 1900  . acetaminophen (TYLENOL) solution 650 mg  650 mg Per Tube Q4H PRN Hammonds, Nicholos JohnsKathleen  H, MD   650 mg at 05/25/17 2219  . albuterol (PROVENTIL) (2.5 MG/3ML) 0.083% nebulizer solution 2.5 mg  2.5 mg Nebulization Q2H PRN Hammonds, Curt Jews, MD      . busPIRone (BUSPAR) tablet 15 mg  15 mg Per Tube BID Coralyn Helling, MD   15 mg at 05/27/17 0936  . cefTRIAXone (ROCEPHIN) 2 g in dextrose 5 % 50 mL IVPB  2 g Intravenous Q24H Roslynn Amble, MD 100 mL/hr at 05/26/17 1826 2 g at 05/26/17 1826  . docusate (COLACE) 50  MG/5ML liquid 100 mg  100 mg Per Tube BID PRN Hammonds, Curt Jews, MD      . docusate (COLACE) 50 MG/5ML liquid 100 mg  100 mg Per Tube BID Roslynn Amble, MD   100 mg at 05/27/17 0936  . feeding supplement (VITAL AF 1.2 CAL) liquid 1,000 mL  1,000 mL Per Tube Continuous Roslynn Amble, MD 65 mL/hr at 05/27/17 1339 1,000 mL at 05/27/17 1339  . fentaNYL (DURAGESIC - dosed mcg/hr) 50 mcg  50 mcg Transdermal Q72H Vassie Loll, MD   50 mcg at 05/25/17 1313  . finasteride (PROSCAR) tablet 5 mg  5 mg Oral Daily Roslynn Amble, MD   5 mg at 05/27/17 1610  . furosemide (LASIX) injection 20 mg  20 mg Intravenous Daily Vassie Loll, MD   20 mg at 05/27/17 1001  . gabapentin (NEURONTIN) 250 MG/5ML solution 300 mg  300 mg Per Tube TID Hammonds, Curt Jews, MD   300 mg at 05/27/17 1514  . heparin injection 5,000 Units  5,000 Units Subcutaneous Q8H Hammonds, Curt Jews, MD   5,000 Units at 05/27/17 1335  . insulin aspart (novoLOG) injection 0-9 Units  0-9 Units Subcutaneous Q4H Roslynn Amble, MD   2 Units at 05/21/17 1601  . lacosamide (VIMPAT) tablet 100 mg  100 mg Per Tube BID Roslynn Amble, MD   100 mg at 05/27/17 0936  . levETIRAcetam (KEPPRA) 100 MG/ML solution 250 mg  250 mg Per Tube BID Hammonds, Curt Jews, MD   250 mg at 05/27/17 0936  . MEDLINE mouth rinse  15 mL Mouth Rinse BID Roslynn Amble, MD   15 mL at 05/27/17 1001  . Melatonin TABS 9 mg  9 mg Tube QHS Coralyn Helling, MD   9 mg at 05/26/17 2124  . ondansetron (ZOFRAN) injection 4 mg  4 mg Intravenous Q6H PRN Hammonds, Curt Jews, MD      . OXcarbazepine (TRILEPTAL) 300 MG/5ML suspension 300 mg  300 mg Per Tube QHS Coralyn Helling, MD   300 mg at 05/26/17 2124  . pantoprazole sodium (PROTONIX) 40 mg/20 mL oral suspension 40 mg  40 mg Per Tube Q24H Coralyn Helling, MD   40 mg at 05/27/17 1335  . saccharomyces boulardii (FLORASTOR) capsule 250 mg  250 mg Oral BID Vassie Loll, MD   250 mg at 05/27/17 0936  . sennosides  (SENOKOT) 8.8 MG/5ML syrup 5 mL  5 mL Per Tube QHS Roslynn Amble, MD   5 mL at 05/26/17 2124  . sodium chloride flush (NS) 0.9 % injection 10-40 mL  10-40 mL Intracatheter PRN Vassie Loll, MD      . topiramate (TOPAMAX) tablet 100 mg  100 mg Per Tube BID Hammonds, Curt Jews, MD   100 mg at 05/27/17 0936  . traZODone (DESYREL) tablet 100 mg  100 mg Per Tube QHS Coralyn Helling, MD   100 mg at 05/26/17 2125  .  venlafaxine (EFFEXOR) tablet 75 mg  75 mg Per Tube TID Coralyn HellingSood, Vineet, MD   75 mg at 05/27/17 1513     Discharge Medications: Please see discharge summary for a list of discharge medications.  Relevant Imaging Results:  Relevant Lab Results:   Additional Information    Arvin Collardara W Jerika Wales, LCSWA

## 2017-05-28 ENCOUNTER — Encounter: Payer: Self-pay | Admitting: Internal Medicine

## 2017-05-28 ENCOUNTER — Non-Acute Institutional Stay (SKILLED_NURSING_FACILITY): Payer: Medicare Other | Admitting: Internal Medicine

## 2017-05-28 DIAGNOSIS — Z9189 Other specified personal risk factors, not elsewhere classified: Secondary | ICD-10-CM

## 2017-05-28 DIAGNOSIS — R7881 Bacteremia: Secondary | ICD-10-CM | POA: Diagnosis not present

## 2017-05-28 DIAGNOSIS — J9601 Acute respiratory failure with hypoxia: Secondary | ICD-10-CM

## 2017-05-28 NOTE — Progress Notes (Signed)
NURSING HOME LOCATION:  [facility] ROOM NUMBER:  [id number]  CODE STATUS:  Full Code  PCP:  [name], Gerardo E, MD  [address] [place] [id number]  This is a nursing facility follow up for Nursing Facility readmission within 30 days  Interim medical record and care since [facility] visit was updated with review of diagnostic studies and change in clinical status since last visit were documented.  HPI: The patient was hospitalized 11/17-11/25/18 with sepsis syndrome complicated by acute respiratory failure with hypoxia .The patient presented with altered mental status, temp of  104, tachycardia, & hypotension. Septic shock was attributed to UTI and Providencia bacteremia; but urine C&S revealed < 10,000 colonies..This was in the context of chronic indwelling Foley catheter. Mechanical ventilation was required from 11/17-11/21. The patient did have history of recurrent aspiration but no acute infiltrates were seen on chest x-ray. He did have anemia of chronic disease with some hemodilution component. No active bleeding was noted. Course was complicated by hypokalemia and hypophosphatemia which were corrected. Sepsis was also thought to be the cause of thrombocytopenia which also resolved. At discharge patient was to continue IV Rocephin until 06/02/17 via PICC line in the right upper extremity.  Type 2 diabetes was treated with sliding scale insulin while an inpatient.  Nutritional supplementation was continued for his severe protein caloric malnutrition.  Other diagnoses include seizure disorder, no seizures occurred while hospitalized. His acute encephalopathy resolved with sepsis resolution. His mood was felt to be stable with no suicidal ideation or hallucinations. The patient does have a history of suicide gesture/attempt on 3 occasions. Predischarge labs revealed chloride of 113, glucose 101, BUN 32, calcium 8.8, hemoglobin 9.7, and hematocrit 31. Indices  were normochromic, normocytic. White count was 8900.  Review of systems: Patient is nonverbal. He shakes his head indicating he has no active cardiopulmonary symptoms.  Constitutional: No fever  ENT/mouth: No nasal congestion,  purulent discharge, earache,change in hearing ,sore throat  Cardiovascular: No chest pain, palpitations,paroxysmal nocturnal dyspnea, claudication, edema  Respiratory: No cough, sputum production,hemoptysis, DOE , significant snoring,apnea   Gastrointestinal: No heartburn,dysphagia,abdominal pain, nausea / vomiting,rectal bleeding, melena,change in bowels Genitourinary: No dysuria,hematuria, pyuria,  incontinence, nocturia Dermatologic: No rash, pruritus, change in appearance of skin Endocrine: No change in hair/skin/ nails, excessive thirst, excessive hunger, excessive urination  Hematologic/lymphatic: No significant bruising, lymphadenopathy,abnormal bleeding  Physical exam:  Pertinent or positive findings: The most striking physical finding is an intense stare. There is intermittent exotropia on the left. He is completely edentulous. He has pattern alopecia. Also he has a well-groomed mustache. He has coarse rhonchi in all lung fields which obscure the heart sounds. PEG is present. Pedal pulses are decreased. He is generally weak, especially in the upper extremities. Flexion contracture suggested in the right foot.  General appearance: no acute distress , increased work of breathing is present.   Lymphatic: No lymphadenopathy about the head, neck, axilla . Eyes: No conjunctival inflammation or lid edema is present. There is no scleral icterus. Ears:  External ear exam shows no significant lesions or deformities.   Nose:  External nasal examination shows no deformity or inflammation. Nasal mucosa are pink and moist without lesions ,exudates Oral exam: lips and gums are healthy appearing.There is no oropharyngeal erythema or exudate . Neck:  No thyromegaly, masses,  tenderness noted.    Heart:  No apparent gallop, murmur, click, rub .  Lungs: without wheezes, rales , rubs. Abdomen:Bowel sounds are normal. Abdomen is  soft and nontender with no organomegaly, hernias,masses. GU: deferred  Extremities:  No cyanosis, clubbing,edema  Skin: Warm & dry w/o tenting. No significant lesions or rash.  See summary under each active problem in the Problem List with associated updated therapeutic plan

## 2017-05-28 NOTE — Assessment & Plan Note (Signed)
Acute respiratory failure resolved but at high risk based on bronchospastic lung disease documented 05/28/17 Aggressive pulmonary toilet with nebulized bronchodilators and incentive spirometry

## 2017-05-28 NOTE — Assessment & Plan Note (Signed)
Continue IV Rocephin through 06/02/17

## 2017-05-28 NOTE — Patient Instructions (Signed)
See assessment and plan under each diagnosis in the problem list and acutely for this visit 

## 2017-05-28 NOTE — Assessment & Plan Note (Signed)
Narcan trial  if he develops acute mental status changes

## 2017-06-04 ENCOUNTER — Emergency Department (HOSPITAL_COMMUNITY): Payer: Medicare Other

## 2017-06-04 ENCOUNTER — Encounter (HOSPITAL_COMMUNITY): Payer: Self-pay | Admitting: Emergency Medicine

## 2017-06-04 ENCOUNTER — Observation Stay (HOSPITAL_COMMUNITY)
Admission: EM | Admit: 2017-06-04 | Discharge: 2017-06-06 | Disposition: A | Discharge status: Skilled Nursing Facility | Payer: Medicare Other | Attending: Urology | Admitting: Urology

## 2017-06-04 DIAGNOSIS — Z9889 Other specified postprocedural states: Secondary | ICD-10-CM | POA: Insufficient documentation

## 2017-06-04 DIAGNOSIS — E119 Type 2 diabetes mellitus without complications: Secondary | ICD-10-CM

## 2017-06-04 DIAGNOSIS — G40909 Epilepsy, unspecified, not intractable, without status epilepticus: Secondary | ICD-10-CM | POA: Diagnosis not present

## 2017-06-04 DIAGNOSIS — Z87440 Personal history of urinary (tract) infections: Secondary | ICD-10-CM | POA: Diagnosis not present

## 2017-06-04 DIAGNOSIS — N132 Hydronephrosis with renal and ureteral calculous obstruction: Secondary | ICD-10-CM | POA: Diagnosis not present

## 2017-06-04 DIAGNOSIS — Z881 Allergy status to other antibiotic agents status: Secondary | ICD-10-CM | POA: Diagnosis not present

## 2017-06-04 DIAGNOSIS — Z947 Corneal transplant status: Secondary | ICD-10-CM | POA: Insufficient documentation

## 2017-06-04 DIAGNOSIS — H18609 Keratoconus, unspecified, unspecified eye: Secondary | ICD-10-CM | POA: Diagnosis not present

## 2017-06-04 DIAGNOSIS — I10 Essential (primary) hypertension: Secondary | ICD-10-CM | POA: Diagnosis not present

## 2017-06-04 DIAGNOSIS — R569 Unspecified convulsions: Secondary | ICD-10-CM

## 2017-06-04 DIAGNOSIS — N50812 Left testicular pain: Secondary | ICD-10-CM | POA: Insufficient documentation

## 2017-06-04 DIAGNOSIS — F322 Major depressive disorder, single episode, severe without psychotic features: Secondary | ICD-10-CM | POA: Diagnosis not present

## 2017-06-04 DIAGNOSIS — F419 Anxiety disorder, unspecified: Secondary | ICD-10-CM | POA: Insufficient documentation

## 2017-06-04 DIAGNOSIS — D638 Anemia in other chronic diseases classified elsewhere: Secondary | ICD-10-CM | POA: Diagnosis not present

## 2017-06-04 DIAGNOSIS — I700 Atherosclerosis of aorta: Secondary | ICD-10-CM | POA: Insufficient documentation

## 2017-06-04 DIAGNOSIS — G839 Paralytic syndrome, unspecified: Secondary | ICD-10-CM | POA: Diagnosis not present

## 2017-06-04 DIAGNOSIS — N138 Other obstructive and reflux uropathy: Secondary | ICD-10-CM

## 2017-06-04 DIAGNOSIS — G231 Progressive supranuclear ophthalmoplegia [Steele-Richardson-Olszewski]: Secondary | ICD-10-CM | POA: Diagnosis not present

## 2017-06-04 DIAGNOSIS — K219 Gastro-esophageal reflux disease without esophagitis: Secondary | ICD-10-CM | POA: Diagnosis not present

## 2017-06-04 DIAGNOSIS — Z960 Presence of urogenital implants: Secondary | ICD-10-CM

## 2017-06-04 DIAGNOSIS — Z931 Gastrostomy status: Secondary | ICD-10-CM | POA: Insufficient documentation

## 2017-06-04 DIAGNOSIS — N50819 Testicular pain, unspecified: Secondary | ICD-10-CM

## 2017-06-04 DIAGNOSIS — M6281 Muscle weakness (generalized): Secondary | ICD-10-CM | POA: Diagnosis not present

## 2017-06-04 DIAGNOSIS — Z978 Presence of other specified devices: Secondary | ICD-10-CM

## 2017-06-04 DIAGNOSIS — M545 Low back pain: Secondary | ICD-10-CM | POA: Diagnosis not present

## 2017-06-04 DIAGNOSIS — M79605 Pain in left leg: Secondary | ICD-10-CM | POA: Insufficient documentation

## 2017-06-04 DIAGNOSIS — Z8673 Personal history of transient ischemic attack (TIA), and cerebral infarction without residual deficits: Secondary | ICD-10-CM | POA: Diagnosis not present

## 2017-06-04 DIAGNOSIS — N200 Calculus of kidney: Secondary | ICD-10-CM | POA: Diagnosis present

## 2017-06-04 DIAGNOSIS — G4700 Insomnia, unspecified: Secondary | ICD-10-CM | POA: Diagnosis not present

## 2017-06-04 DIAGNOSIS — M8580 Other specified disorders of bone density and structure, unspecified site: Secondary | ICD-10-CM | POA: Insufficient documentation

## 2017-06-04 DIAGNOSIS — E7800 Pure hypercholesterolemia, unspecified: Secondary | ICD-10-CM | POA: Diagnosis not present

## 2017-06-04 DIAGNOSIS — R1032 Left lower quadrant pain: Secondary | ICD-10-CM | POA: Diagnosis present

## 2017-06-04 DIAGNOSIS — J90 Pleural effusion, not elsewhere classified: Secondary | ICD-10-CM | POA: Insufficient documentation

## 2017-06-04 DIAGNOSIS — K429 Umbilical hernia without obstruction or gangrene: Secondary | ICD-10-CM | POA: Insufficient documentation

## 2017-06-04 DIAGNOSIS — J449 Chronic obstructive pulmonary disease, unspecified: Secondary | ICD-10-CM | POA: Insufficient documentation

## 2017-06-04 DIAGNOSIS — S069X9A Unspecified intracranial injury with loss of consciousness of unspecified duration, initial encounter: Secondary | ICD-10-CM | POA: Diagnosis present

## 2017-06-04 DIAGNOSIS — S069XAA Unspecified intracranial injury with loss of consciousness status unknown, initial encounter: Secondary | ICD-10-CM | POA: Diagnosis present

## 2017-06-04 DIAGNOSIS — M109 Gout, unspecified: Secondary | ICD-10-CM | POA: Diagnosis not present

## 2017-06-04 DIAGNOSIS — M47816 Spondylosis without myelopathy or radiculopathy, lumbar region: Secondary | ICD-10-CM | POA: Insufficient documentation

## 2017-06-04 DIAGNOSIS — N319 Neuromuscular dysfunction of bladder, unspecified: Secondary | ICD-10-CM | POA: Diagnosis not present

## 2017-06-04 DIAGNOSIS — R0902 Hypoxemia: Secondary | ICD-10-CM | POA: Insufficient documentation

## 2017-06-04 DIAGNOSIS — Z9049 Acquired absence of other specified parts of digestive tract: Secondary | ICD-10-CM | POA: Insufficient documentation

## 2017-06-04 DIAGNOSIS — E1140 Type 2 diabetes mellitus with diabetic neuropathy, unspecified: Secondary | ICD-10-CM | POA: Insufficient documentation

## 2017-06-04 DIAGNOSIS — Z79899 Other long term (current) drug therapy: Secondary | ICD-10-CM | POA: Insufficient documentation

## 2017-06-04 DIAGNOSIS — Z880 Allergy status to penicillin: Secondary | ICD-10-CM | POA: Insufficient documentation

## 2017-06-04 DIAGNOSIS — Z87820 Personal history of traumatic brain injury: Secondary | ICD-10-CM | POA: Diagnosis not present

## 2017-06-04 DIAGNOSIS — Z87891 Personal history of nicotine dependence: Secondary | ICD-10-CM | POA: Insufficient documentation

## 2017-06-04 LAB — CBC WITH DIFFERENTIAL/PLATELET
Basophils Absolute: 0.1 10*3/uL (ref 0.0–0.1)
Basophils Relative: 1 %
Eosinophils Absolute: 0.3 10*3/uL (ref 0.0–0.7)
Eosinophils Relative: 4 %
HEMATOCRIT: 37.8 % — AB (ref 39.0–52.0)
HEMOGLOBIN: 11.9 g/dL — AB (ref 13.0–17.0)
LYMPHS PCT: 23 %
Lymphs Abs: 2 10*3/uL (ref 0.7–4.0)
MCH: 31.2 pg (ref 26.0–34.0)
MCHC: 31.5 g/dL (ref 30.0–36.0)
MCV: 99.2 fL (ref 78.0–100.0)
MONOS PCT: 7 %
Monocytes Absolute: 0.6 10*3/uL (ref 0.1–1.0)
Neutro Abs: 5.6 10*3/uL (ref 1.7–7.7)
Neutrophils Relative %: 65 %
Platelets: 299 10*3/uL (ref 150–400)
RBC: 3.81 MIL/uL — AB (ref 4.22–5.81)
RDW: 14.6 % (ref 11.5–15.5)
WBC: 8.6 10*3/uL (ref 4.0–10.5)

## 2017-06-04 LAB — URINALYSIS, ROUTINE W REFLEX MICROSCOPIC
Bilirubin Urine: NEGATIVE
GLUCOSE, UA: NEGATIVE mg/dL
HGB URINE DIPSTICK: NEGATIVE
Ketones, ur: NEGATIVE mg/dL
Leukocytes, UA: NEGATIVE
Nitrite: NEGATIVE
Protein, ur: NEGATIVE mg/dL
Specific Gravity, Urine: 1.015 (ref 1.005–1.030)
pH: 8 (ref 5.0–8.0)

## 2017-06-04 LAB — COMPREHENSIVE METABOLIC PANEL
ALBUMIN: 3.6 g/dL (ref 3.5–5.0)
ALK PHOS: 94 U/L (ref 38–126)
ALT: 11 U/L — ABNORMAL LOW (ref 17–63)
AST: 18 U/L (ref 15–41)
Anion gap: 8 (ref 5–15)
BILIRUBIN TOTAL: 0.2 mg/dL — AB (ref 0.3–1.2)
BUN: 32 mg/dL — AB (ref 6–20)
CALCIUM: 9.3 mg/dL (ref 8.9–10.3)
CO2: 27 mmol/L (ref 22–32)
Chloride: 100 mmol/L — ABNORMAL LOW (ref 101–111)
Creatinine, Ser: 0.95 mg/dL (ref 0.61–1.24)
GFR calc Af Amer: >60 mL/min (ref >60)
GFR calc non Af Amer: >60 mL/min (ref >60)
GLUCOSE: 89 mg/dL (ref 65–99)
POTASSIUM: 4.2 mmol/L (ref 3.5–5.1)
Sodium: 135 mmol/L (ref 135–145)
Total Protein: 7.3 g/dL (ref 6.5–8.1)

## 2017-06-04 LAB — I-STAT CG4 LACTIC ACID, ED: Lactic Acid, Venous: 0.46 mmol/L — ABNORMAL LOW (ref 0.5–1.9)

## 2017-06-04 MED ORDER — FENTANYL CITRATE (PF) 100 MCG/2ML IJ SOLN
50.0000 ug | Freq: Once | INTRAMUSCULAR | Status: AC
  Administered 2017-06-04: 50 ug via INTRAVENOUS
  Filled 2017-06-04: qty 2

## 2017-06-04 MED ORDER — ONDANSETRON HCL 4 MG/2ML IJ SOLN
4.0000 mg | Freq: Once | INTRAMUSCULAR | Status: AC
  Administered 2017-06-04: 4 mg via INTRAVENOUS
  Filled 2017-06-04: qty 2

## 2017-06-04 NOTE — ED Notes (Signed)
Patient transported to Ultrasound 

## 2017-06-04 NOTE — ED Notes (Signed)
ED Provider at bedside. 
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
## 2017-06-04 NOTE — ED Triage Notes (Signed)
Per EMS[facility]. Pt c/o left lower groin pain and left ax\illary pain the last 2 days. Pt has been receiving antibiotics through PICC line for MRSA found in urine. Pt has been receiving PRN oxycodone every 8 hours for pain which has not helped. Pt non-verbal, communicates through tablet.

## 2017-06-04 NOTE — ED Notes (Signed)
Patient transported to CT 

## 2017-06-05 ENCOUNTER — Observation Stay (HOSPITAL_COMMUNITY): Payer: Medicare Other

## 2017-06-05 ENCOUNTER — Observation Stay (HOSPITAL_COMMUNITY): Payer: Medicare Other | Admitting: Certified Registered Nurse Anesthetist

## 2017-06-05 ENCOUNTER — Encounter (HOSPITAL_COMMUNITY): Payer: Self-pay | Admitting: Family Medicine

## 2017-06-05 ENCOUNTER — Encounter (HOSPITAL_COMMUNITY)
Admission: EM | Disposition: A | Discharge status: Skilled Nursing Facility | Payer: Self-pay | Source: Home / Self Care | Attending: Emergency Medicine

## 2017-06-05 ENCOUNTER — Other Ambulatory Visit: Payer: Self-pay

## 2017-06-05 ENCOUNTER — Emergency Department (HOSPITAL_COMMUNITY): Payer: Medicare Other

## 2017-06-05 ENCOUNTER — Other Ambulatory Visit (HOSPITAL_COMMUNITY): Payer: Self-pay

## 2017-06-05 DIAGNOSIS — G40909 Epilepsy, unspecified, not intractable, without status epilepticus: Secondary | ICD-10-CM | POA: Diagnosis not present

## 2017-06-05 DIAGNOSIS — F322 Major depressive disorder, single episode, severe without psychotic features: Secondary | ICD-10-CM | POA: Diagnosis not present

## 2017-06-05 DIAGNOSIS — E1140 Type 2 diabetes mellitus with diabetic neuropathy, unspecified: Secondary | ICD-10-CM | POA: Diagnosis not present

## 2017-06-05 DIAGNOSIS — N138 Other obstructive and reflux uropathy: Secondary | ICD-10-CM | POA: Diagnosis not present

## 2017-06-05 DIAGNOSIS — N200 Calculus of kidney: Secondary | ICD-10-CM | POA: Insufficient documentation

## 2017-06-05 DIAGNOSIS — N132 Hydronephrosis with renal and ureteral calculous obstruction: Secondary | ICD-10-CM | POA: Diagnosis not present

## 2017-06-05 DIAGNOSIS — R569 Unspecified convulsions: Secondary | ICD-10-CM | POA: Diagnosis not present

## 2017-06-05 HISTORY — PX: CYSTOSCOPY/URETEROSCOPY/HOLMIUM LASER/STENT PLACEMENT: SHX6546

## 2017-06-05 LAB — GLUCOSE, CAPILLARY
GLUCOSE-CAPILLARY: 94 mg/dL (ref 65–99)
GLUCOSE-CAPILLARY: 79 mg/dL (ref 65–99)

## 2017-06-05 LAB — BASIC METABOLIC PANEL
Anion gap: 12 (ref 5–15)
BUN: 32 mg/dL — AB (ref 6–20)
CALCIUM: 9.1 mg/dL (ref 8.9–10.3)
CO2: 23 mmol/L (ref 22–32)
CREATININE: 1.02 mg/dL (ref 0.61–1.24)
Chloride: 99 mmol/L — ABNORMAL LOW (ref 101–111)
GFR calc Af Amer: >60 mL/min (ref >60)
GLUCOSE: 101 mg/dL — AB (ref 65–99)
Potassium: 4.3 mmol/L (ref 3.5–5.1)
SODIUM: 134 mmol/L — AB (ref 135–145)

## 2017-06-05 LAB — CBC WITH DIFFERENTIAL/PLATELET
BASOS ABS: 0.1 10*3/uL (ref 0.0–0.1)
Basophils Relative: 1 %
Eosinophils Absolute: 0.3 10*3/uL (ref 0.0–0.7)
Eosinophils Relative: 4 %
HEMATOCRIT: 37.2 % — AB (ref 39.0–52.0)
Hemoglobin: 11.8 g/dL — ABNORMAL LOW (ref 13.0–17.0)
LYMPHS ABS: 2 10*3/uL (ref 0.7–4.0)
Lymphocytes Relative: 23 %
MCH: 31.8 pg (ref 26.0–34.0)
MCHC: 31.7 g/dL (ref 30.0–36.0)
MCV: 100.3 fL — ABNORMAL HIGH (ref 78.0–100.0)
MONO ABS: 0.7 10*3/uL (ref 0.1–1.0)
MONOS PCT: 8 %
NEUTROS ABS: 5.5 10*3/uL (ref 1.7–7.7)
Neutrophils Relative %: 64 %
PLATELETS: 258 10*3/uL (ref 150–400)
RBC: 3.71 MIL/uL — AB (ref 4.22–5.81)
RDW: 14.7 % (ref 11.5–15.5)
WBC: 8.6 10*3/uL (ref 4.0–10.5)

## 2017-06-05 SURGERY — CYSTOSCOPY/URETEROSCOPY/HOLMIUM LASER/STENT PLACEMENT
Anesthesia: General | Laterality: Left

## 2017-06-05 MED ORDER — POLYVINYL ALCOHOL 1.4 % OP SOLN
1.0000 [drp] | Freq: Every day | OPHTHALMIC | Status: DC
  Filled 2017-06-05: qty 15

## 2017-06-05 MED ORDER — ARFORMOTEROL TARTRATE 15 MCG/2ML IN NEBU
15.0000 ug | INHALATION_SOLUTION | Freq: Two times a day (BID) | RESPIRATORY_TRACT | Status: DC
  Administered 2017-06-05 – 2017-06-06 (×2): 15 ug via RESPIRATORY_TRACT
  Filled 2017-06-05 (×3): qty 2

## 2017-06-05 MED ORDER — FLUTICASONE PROPIONATE 50 MCG/ACT NA SUSP
1.0000 | Freq: Every day | NASAL | Status: DC
  Administered 2017-06-06: 1 via NASAL
  Filled 2017-06-05: qty 16

## 2017-06-05 MED ORDER — LISINOPRIL 5 MG PO TABS
2.5000 mg | ORAL_TABLET | Freq: Every day | ORAL | Status: DC
  Administered 2017-06-06: 2.5 mg via ORAL
  Filled 2017-06-05: qty 1

## 2017-06-05 MED ORDER — POLYVINYL ALCOHOL 1.4 % OP SOLN
1.0000 [drp] | Freq: Every day | OPHTHALMIC | Status: DC
  Administered 2017-06-05 – 2017-06-06 (×2): 1 [drp] via OPHTHALMIC
  Filled 2017-06-05: qty 15

## 2017-06-05 MED ORDER — PHENYLEPHRINE HCL 10 MG/ML IJ SOLN
INTRAMUSCULAR | Status: DC | PRN
  Administered 2017-06-05: 40 ug via INTRAVENOUS
  Administered 2017-06-05: 120 ug via INTRAVENOUS
  Administered 2017-06-05: 60 ug via INTRAVENOUS
  Administered 2017-06-05 (×2): 80 ug via INTRAVENOUS

## 2017-06-05 MED ORDER — SACCHAROMYCES BOULARDII 250 MG PO CAPS
250.0000 mg | ORAL_CAPSULE | Freq: Two times a day (BID) | ORAL | Status: DC
  Administered 2017-06-05 – 2017-06-06 (×2): 250 mg via ORAL
  Filled 2017-06-05 (×3): qty 1

## 2017-06-05 MED ORDER — FENTANYL CITRATE (PF) 100 MCG/2ML IJ SOLN
50.0000 ug | Freq: Once | INTRAMUSCULAR | Status: AC
  Administered 2017-06-05: 50 ug via INTRAVENOUS
  Filled 2017-06-05: qty 2

## 2017-06-05 MED ORDER — SODIUM CHLORIDE 0.9 % IV SOLN
INTRAVENOUS | Status: DC
  Administered 2017-06-05 (×2): via INTRAVENOUS

## 2017-06-05 MED ORDER — FENTANYL CITRATE (PF) 100 MCG/2ML IJ SOLN
INTRAMUSCULAR | Status: AC
  Administered 2017-06-05: 50 ug via INTRAVENOUS
  Filled 2017-06-05: qty 2

## 2017-06-05 MED ORDER — FINASTERIDE 5 MG PO TABS
5.0000 mg | ORAL_TABLET | Freq: Every day | ORAL | Status: DC
  Administered 2017-06-06: 5 mg via ORAL
  Filled 2017-06-05 (×2): qty 1

## 2017-06-05 MED ORDER — ROCURONIUM BROMIDE 10 MG/ML (PF) SYRINGE
PREFILLED_SYRINGE | INTRAVENOUS | Status: DC | PRN
  Administered 2017-06-05: 25 mg via INTRAVENOUS

## 2017-06-05 MED ORDER — LIDOCAINE 2% (20 MG/ML) 5 ML SYRINGE
INTRAMUSCULAR | Status: AC
Start: 2017-06-05 — End: ?
  Filled 2017-06-05: qty 5

## 2017-06-05 MED ORDER — CARBOXYMETHYLCELLULOSE SODIUM 0.5 % OP SOLN: 1.0000 [drp] | Freq: Every day | OPHTHALMIC | Status: DC

## 2017-06-05 MED ORDER — DEXTROSE 5 % IV SOLN
2.0000 g | Freq: Once | INTRAVENOUS | Status: AC
  Administered 2017-06-05: 2 g via INTRAVENOUS
  Filled 2017-06-05: qty 2

## 2017-06-05 MED ORDER — SODIUM CHLORIDE 0.9 % IV SOLN
INTRAVENOUS | Status: DC
  Administered 2017-06-05 – 2017-06-06 (×3): via INTRAVENOUS

## 2017-06-05 MED ORDER — SODIUM CHLORIDE 0.9 % IR SOLN
Status: DC | PRN
  Administered 2017-06-05: 4000 mL via INTRAVESICAL

## 2017-06-05 MED ORDER — MORPHINE SULFATE (PF) 4 MG/ML IV SOLN
4.0000 mg | INTRAVENOUS | Status: DC | PRN
  Administered 2017-06-05 – 2017-06-06 (×4): 4 mg via INTRAVENOUS
  Filled 2017-06-05 (×4): qty 1

## 2017-06-05 MED ORDER — PHENYLEPHRINE 40 MCG/ML (10ML) SYRINGE FOR IV PUSH (FOR BLOOD PRESSURE SUPPORT)
PREFILLED_SYRINGE | INTRAVENOUS | Status: AC
  Filled 2017-06-05: qty 10

## 2017-06-05 MED ORDER — ACETAMINOPHEN 650 MG RE SUPP: 650.0000 mg | Freq: Four times a day (QID) | RECTAL | Status: DC | PRN

## 2017-06-05 MED ORDER — SUGAMMADEX SODIUM 200 MG/2ML IV SOLN
INTRAVENOUS | Status: DC | PRN
  Administered 2017-06-05: 150 mg via INTRAVENOUS

## 2017-06-05 MED ORDER — OXCARBAZEPINE 300 MG/5ML PO SUSP
300.0000 mg | Freq: Every day | ORAL | Status: DC
  Administered 2017-06-05: 300 mg
  Filled 2017-06-05 (×2): qty 5

## 2017-06-05 MED ORDER — DEXMEDETOMIDINE HCL IN NACL 400 MCG/100ML IV SOLN: INTRAVENOUS | Status: DC | PRN

## 2017-06-05 MED ORDER — MORPHINE SULFATE (PF) 4 MG/ML IV SOLN
4.0000 mg | Freq: Once | INTRAVENOUS | Status: AC
  Administered 2017-06-05: 4 mg via INTRAVENOUS
  Filled 2017-06-05: qty 1

## 2017-06-05 MED ORDER — TRAZODONE HCL 100 MG PO TABS
100.0000 mg | ORAL_TABLET | Freq: Every day | ORAL | Status: DC
  Administered 2017-06-05: 100 mg
  Filled 2017-06-05: qty 1

## 2017-06-05 MED ORDER — IOPAMIDOL (ISOVUE-300) INJECTION 61%
100.0000 mL | Freq: Once | INTRAVENOUS | Status: AC | PRN
  Administered 2017-06-05: 100 mL via INTRAVENOUS

## 2017-06-05 MED ORDER — ACETAMINOPHEN 325 MG PO TABS: 650.0000 mg | ORAL_TABLET | Freq: Four times a day (QID) | ORAL | Status: DC | PRN

## 2017-06-05 MED ORDER — TIOTROPIUM BROMIDE MONOHYDRATE 18 MCG IN CAPS
18.0000 ug | ORAL_CAPSULE | Freq: Every day | RESPIRATORY_TRACT | Status: DC
  Administered 2017-06-06: 18 ug via RESPIRATORY_TRACT
  Filled 2017-06-05: qty 5

## 2017-06-05 MED ORDER — ONDANSETRON HCL 4 MG/2ML IJ SOLN
INTRAMUSCULAR | Status: AC
  Filled 2017-06-05: qty 2

## 2017-06-05 MED ORDER — FENTANYL CITRATE (PF) 100 MCG/2ML IJ SOLN: 25.0000 ug | INTRAMUSCULAR | Status: DC | PRN

## 2017-06-05 MED ORDER — LIDOCAINE 2% (20 MG/ML) 5 ML SYRINGE
INTRAMUSCULAR | Status: DC | PRN
  Administered 2017-06-05: 40 mg via INTRAVENOUS

## 2017-06-05 MED ORDER — OXYCODONE HCL 5 MG PO TABS
10.0000 mg | ORAL_TABLET | Freq: Three times a day (TID) | ORAL | Status: DC | PRN
  Administered 2017-06-05: 10 mg
  Filled 2017-06-05: qty 2

## 2017-06-05 MED ORDER — FENTANYL CITRATE (PF) 100 MCG/2ML IJ SOLN
INTRAMUSCULAR | Status: AC
  Filled 2017-06-05: qty 2

## 2017-06-05 MED ORDER — FENTANYL CITRATE (PF) 100 MCG/2ML IJ SOLN
50.0000 ug | Freq: Once | INTRAMUSCULAR | Status: AC
  Administered 2017-06-05 (×2): 50 ug via INTRAVENOUS

## 2017-06-05 MED ORDER — MELATONIN 10 MG PO TABS: 10.0000 mg | ORAL_TABLET | Freq: Every day | ORAL | Status: DC

## 2017-06-05 MED ORDER — CELECOXIB 200 MG PO CAPS
200.0000 mg | ORAL_CAPSULE | Freq: Every day | ORAL | Status: DC
  Administered 2017-06-06: 200 mg
  Filled 2017-06-05 (×2): qty 1

## 2017-06-05 MED ORDER — ONDANSETRON HCL 4 MG PO TABS: 4.0000 mg | ORAL_TABLET | Freq: Four times a day (QID) | ORAL | Status: DC | PRN

## 2017-06-05 MED ORDER — DEXAMETHASONE SODIUM PHOSPHATE 10 MG/ML IJ SOLN
INTRAMUSCULAR | Status: AC
Start: 2017-06-05 — End: ?
  Filled 2017-06-05: qty 1

## 2017-06-05 MED ORDER — ALLOPURINOL 300 MG PO TABS
300.0000 mg | ORAL_TABLET | Freq: Every day | ORAL | Status: DC
  Administered 2017-06-06: 300 mg
  Filled 2017-06-05 (×2): qty 1

## 2017-06-05 MED ORDER — MIDAZOLAM HCL 2 MG/2ML IJ SOLN
INTRAMUSCULAR | Status: AC
Start: 2017-06-05 — End: ?
  Filled 2017-06-05: qty 2

## 2017-06-05 MED ORDER — 0.9 % SODIUM CHLORIDE (POUR BTL) OPTIME
TOPICAL | Status: DC | PRN
  Administered 2017-06-05: 1000 mL

## 2017-06-05 MED ORDER — ONDANSETRON HCL 4 MG/2ML IJ SOLN: 4.0000 mg | Freq: Four times a day (QID) | INTRAMUSCULAR | Status: DC | PRN

## 2017-06-05 MED ORDER — TOPIRAMATE 100 MG PO TABS
100.0000 mg | ORAL_TABLET | Freq: Two times a day (BID) | ORAL | Status: DC
  Administered 2017-06-05 – 2017-06-06 (×2): 100 mg via ORAL
  Filled 2017-06-05 (×2): qty 1

## 2017-06-05 MED ORDER — POLYETHYLENE GLYCOL 3350 17 G PO PACK
17.0000 g | PACK | Freq: Every day | ORAL | Status: DC
  Administered 2017-06-06: 17 g
  Filled 2017-06-05: qty 1

## 2017-06-05 MED ORDER — OMEPRAZOLE 2 MG/ML ORAL SUSPENSION: 40.0000 mg | Freq: Every day | ORAL | Status: DC

## 2017-06-05 MED ORDER — ONDANSETRON HCL 4 MG/2ML IJ SOLN
INTRAMUSCULAR | Status: DC | PRN
  Administered 2017-06-05: 4 mg via INTRAVENOUS

## 2017-06-05 MED ORDER — IPRATROPIUM-ALBUTEROL 0.5-2.5 (3) MG/3ML IN SOLN: 3.0000 mL | Freq: Four times a day (QID) | RESPIRATORY_TRACT | Status: DC | PRN

## 2017-06-05 MED ORDER — IOHEXOL 300 MG/ML  SOLN
INTRAMUSCULAR | Status: DC | PRN
  Administered 2017-06-05: 8 mL via URETHRAL

## 2017-06-05 MED ORDER — BUSPIRONE HCL 5 MG PO TABS
15.0000 mg | ORAL_TABLET | Freq: Two times a day (BID) | ORAL | Status: DC
  Administered 2017-06-05 – 2017-06-06 (×2): 15 mg
  Filled 2017-06-05 (×2): qty 1

## 2017-06-05 MED ORDER — SODIUM CHLORIDE 0.9 % IV SOLN
INTRAVENOUS | Status: DC | PRN
  Administered 2017-06-05: 15:00:00 via INTRAVENOUS

## 2017-06-05 MED ORDER — LEVETIRACETAM 100 MG/ML PO SOLN
250.0000 mg | Freq: Two times a day (BID) | ORAL | Status: DC
  Administered 2017-06-05 – 2017-06-06 (×3): 250 mg
  Filled 2017-06-05 (×5): qty 2.5

## 2017-06-05 MED ORDER — ONDANSETRON HCL 4 MG/2ML IJ SOLN
4.0000 mg | Freq: Once | INTRAMUSCULAR | Status: AC
  Administered 2017-06-05: 4 mg via INTRAVENOUS
  Filled 2017-06-05: qty 2

## 2017-06-05 MED ORDER — SUGAMMADEX SODIUM 200 MG/2ML IV SOLN
INTRAVENOUS | Status: AC
  Filled 2017-06-05: qty 2

## 2017-06-05 MED ORDER — DEXMEDETOMIDINE HCL IN NACL 200 MCG/50ML IV SOLN
INTRAVENOUS | Status: DC | PRN
  Administered 2017-06-05 (×2): 20 ug via INTRAVENOUS

## 2017-06-05 MED ORDER — PROPOFOL 10 MG/ML IV BOLUS
INTRAVENOUS | Status: DC | PRN
  Administered 2017-06-05: 100 mg via INTRAVENOUS

## 2017-06-05 MED ORDER — PANTOPRAZOLE SODIUM 40 MG PO PACK
40.0000 mg | PACK | Freq: Every day | ORAL | Status: DC
  Administered 2017-06-06: 40 mg
  Filled 2017-06-05 (×2): qty 20

## 2017-06-05 SURGICAL SUPPLY — 27 items
BAG URINE DRAINAGE (UROLOGICAL SUPPLIES) ×2 IMPLANT
BAG URO CATCHER STRL LF (MISCELLANEOUS) ×3 IMPLANT
BASKET ZERO TIP NITINOL 2.4FR (BASKET) ×1 IMPLANT
BSKT STON RTRVL ZERO TP 2.4FR (BASKET)
CATH FOLEY 2WAY SLVR  5CC 20FR (CATHETERS) ×2
CATH FOLEY 2WAY SLVR 5CC 20FR (CATHETERS) IMPLANT
CATH INTERMIT  6FR 70CM (CATHETERS) ×1 IMPLANT
CLOTH BEACON ORANGE TIMEOUT ST (SAFETY) ×3 IMPLANT
COVER FOOTSWITCH UNIV (MISCELLANEOUS) ×1 IMPLANT
DRAPE C-ARM 42X120 X-RAY (DRAPES) ×3 IMPLANT
EXTRACTOR STONE NITINOL NGAGE (UROLOGICAL SUPPLIES) IMPLANT
FIBER LASER FLEXIVA 365 (UROLOGICAL SUPPLIES) ×1 IMPLANT
FIBER LASER TRAC TIP (UROLOGICAL SUPPLIES) ×3 IMPLANT
GLOVE BIOGEL M STRL SZ7.5 (GLOVE) ×3 IMPLANT
GOWN STRL REUS W/TWL LRG LVL3 (GOWN DISPOSABLE) ×1 IMPLANT
GOWN STRL REUS W/TWL XL LVL3 (GOWN DISPOSABLE) ×3 IMPLANT
GUIDEWIRE ANG ZIPWIRE 038X150 (WIRE) IMPLANT
GUIDEWIRE STR DUAL SENSOR (WIRE) ×3 IMPLANT
HOLDER FOLEY CATH W/STRAP (MISCELLANEOUS) ×2 IMPLANT
MANIFOLD NEPTUNE II (INSTRUMENTS) ×3 IMPLANT
PACK CYSTO (CUSTOM PROCEDURE TRAY) ×3 IMPLANT
SHEATH URETERAL 12FRX35CM (MISCELLANEOUS) ×1 IMPLANT
STENT URET 6FRX26 CONTOUR (STENTS) ×2 IMPLANT
SYR CONTROL 10ML LL (SYRINGE) ×2 IMPLANT
TUBING CONNECTING 10 (TUBING) ×2 IMPLANT
TUBING CONNECTING 10' (TUBING) ×1
WATER STERILE IRR 500ML POUR (IV SOLUTION) ×2 IMPLANT
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
## 2017-06-05 NOTE — Anesthesia Procedure Notes (Signed)
Procedure Name: Intubation Date/Time: 06/05/2017 3:29 PM Performed by: Thornell MuleStubblefield, Tareka Jhaveri G, CRNA Pre-anesthesia Checklist: Patient identified, Emergency Drugs available, Suction available and Patient being monitored Patient Re-evaluated:Patient Re-evaluated prior to induction Oxygen Delivery Method: Circle system utilized Preoxygenation: Pre-oxygenation with 100% oxygen Induction Type: IV induction Ventilation: Mask ventilation without difficulty Laryngoscope Size: Miller and 3 Grade View: Grade I Tube type: Oral Tube size: 7.5 mm Number of attempts: 1 Airway Equipment and Method: Stylet and Oral airway Placement Confirmation: ETT inserted through vocal cords under direct vision,  positive ETCO2 and breath sounds checked- equal and bilateral Secured at: 22 cm Tube secured with: Tape Dental Injury: Teeth and Oropharynx as per pre-operative assessment

## 2017-06-05 NOTE — Progress Notes (Signed)
PHARMACIST - PHYSICIAN ORDER COMMUNICATION  CONCERNING: P&T Medication Policy on Herbal Medications  DESCRIPTION:  This patient's order for:  melatonin  has been noted.  This product(s) is classified as an "herbal" or natural product. Due to a lack of definitive safety studies or FDA approval, nonstandard manufacturing practices, plus the potential risk of unknown drug-drug interactions while on inpatient medications, the Pharmacy and Therapeutics Committee does not permit the use of "herbal" or natural products of this type within [facility].   ACTION TAKEN: The pharmacy department is unable to verify this order at this time and your patient has been informed of this safety policy. Please reevaluate patient's clinical condition at discharge and address if the herbal or natural product(s) should be resumed at that time.  Clance BollAmanda Kamaree Berkel, PharmD Pager: [phone] 06/05/2017

## 2017-06-05 NOTE — Transfer of Care (Signed)
Immediate Anesthesia Transfer of Care Note  Patient: Alexander [name]  Procedure(s) Performed: CYSTOSCOPY/URETEROSCOPY/RETROGRADE PYELOGRAM/HOLMIUM LASER/STENT PLACEMENT (Left )  Patient Location: PACU  Anesthesia Type:General  Level of Consciousness: awake, alert  and oriented  Airway & Oxygen Therapy: Patient Spontanous Breathing and Patient connected to face mask oxygen  Post-op Assessment: Report given to RN and Post -op Vital signs reviewed and stable  Post vital signs: Reviewed and stable  Last Vitals:  Vitals:   06/05/17 1503 06/05/17 1509  BP: 102/68 98/66  Pulse: 83 81  Resp: 16 12  Temp:    SpO2: 97% 97%    Last Pain:  Vitals:   06/05/17 0717  TempSrc:   PainSc: 8          Complications: No apparent anesthesia complications

## 2017-06-05 NOTE — ED Notes (Signed)
ED Provider at bedside. 
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
## 2017-06-05 NOTE — ED Notes (Signed)
Emptied foley bag had 900 mL in bag

## 2017-06-05 NOTE — ED Provider Notes (Signed)
Patient signed out pending CT scan.  In brief, presents with worsening left flank and left groin pain.  Ultrasound negative.  CT scan shows evidence of a 9 mm obstructing stone on the left.  Urinalysis without evidence of UTI; however, the patient did recently have a history of sepsis secondary to UTI requiring admission.  He has continued pain.  He was reduced pain and nausea medication.  I discussed the patient with Dr. Liliane ShiWinter, urology.  Given the size of the stone and the patient's history, he recommends admission and possible stent placement later today.  Request that the patient be n.p.o. after midnight.  Will discuss with admitting hospitalist.  Patient was updated.    Shon BatonHorton, Courtney F, MD 06/05/17 [phone]
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
## 2017-06-05 NOTE — Interval H&P Note (Signed)
History and Physical Interval Note:  06/05/2017 12:23 PM  Alexander [name]  has presented today for surgery, with the diagnosis of Left ureteral stone  The various methods of treatment have been discussed with the patient and family. After consideration of risks, benefits and other options for treatment, the patient has consented to  Procedure(s): CYSTOSCOPY/URETEROSCOPY/HOLMIUM LASER/STENT PLACEMENT (Left) as a surgical intervention .  The patient's history has been reviewed, patient examined, no change in status, stable for surgery.  I have reviewed the patient's chart and labs.  Questions were answered to the patient's satisfaction.     Dorian Furnacehristopher Aaron Winter

## 2017-06-05 NOTE — ED Notes (Signed)
[facility] and [facility] via [facility]. Notified Doug w/ [facility] that pt has to be at the OR by 12pm. Gala Romneyoug stated that he talked w/ Jean[facility]olly [facility] and she said they don't want the pt in their department before 11:30. Doug told me this NS that he [facility]olly [facility] that they'll do a 11:15 pickup and 11:45 [facility]. Jill AlexandersJustin RN made aware.

## 2017-06-06 ENCOUNTER — Encounter (HOSPITAL_COMMUNITY): Payer: Self-pay | Admitting: Urology

## 2017-06-06 DIAGNOSIS — E1100 Type 2 diabetes mellitus with hyperosmolarity without nonketotic hyperglycemic-hyperosmolar coma (NKHHC): Secondary | ICD-10-CM | POA: Diagnosis not present

## 2017-06-06 DIAGNOSIS — R569 Unspecified convulsions: Secondary | ICD-10-CM

## 2017-06-06 DIAGNOSIS — N50819 Testicular pain, unspecified: Secondary | ICD-10-CM | POA: Diagnosis not present

## 2017-06-06 DIAGNOSIS — Z9289 Personal history of other medical treatment: Secondary | ICD-10-CM | POA: Diagnosis not present

## 2017-06-06 DIAGNOSIS — N138 Other obstructive and reflux uropathy: Secondary | ICD-10-CM

## 2017-06-06 DIAGNOSIS — F322 Major depressive disorder, single episode, severe without psychotic features: Secondary | ICD-10-CM | POA: Diagnosis not present

## 2017-06-06 DIAGNOSIS — N200 Calculus of kidney: Secondary | ICD-10-CM | POA: Diagnosis not present

## 2017-06-06 DIAGNOSIS — N132 Hydronephrosis with renal and ureteral calculous obstruction: Secondary | ICD-10-CM | POA: Diagnosis not present

## 2017-06-06 DIAGNOSIS — R1032 Left lower quadrant pain: Secondary | ICD-10-CM | POA: Diagnosis not present

## 2017-06-06 LAB — CBC
HEMATOCRIT: 33.6 % — AB (ref 39.0–52.0)
HEMOGLOBIN: 10.7 g/dL — AB (ref 13.0–17.0)
MCH: 31.9 pg (ref 26.0–34.0)
MCHC: 31.8 g/dL (ref 30.0–36.0)
MCV: 100.3 fL — ABNORMAL HIGH (ref 78.0–100.0)
Platelets: 208 10*3/uL (ref 150–400)
RBC: 3.35 MIL/uL — ABNORMAL LOW (ref 4.22–5.81)
RDW: 14.4 % (ref 11.5–15.5)
WBC: 6.6 10*3/uL (ref 4.0–10.5)

## 2017-06-06 LAB — BASIC METABOLIC PANEL
ANION GAP: 6 (ref 5–15)
BUN: 26 mg/dL — ABNORMAL HIGH (ref 6–20)
CHLORIDE: 111 mmol/L (ref 101–111)
CO2: 24 mmol/L (ref 22–32)
Calcium: 8.9 mg/dL (ref 8.9–10.3)
Creatinine, Ser: 0.72 mg/dL (ref 0.61–1.24)
GFR calc non Af Amer: >60 mL/min (ref >60)
Glucose, Bld: 77 mg/dL (ref 65–99)
POTASSIUM: 3.8 mmol/L (ref 3.5–5.1)
Sodium: 141 mmol/L (ref 135–145)

## 2017-06-06 LAB — GLUCOSE, CAPILLARY
GLUCOSE-CAPILLARY: 81 mg/dL (ref 65–99)
GLUCOSE-CAPILLARY: 76 mg/dL (ref 65–99)
GLUCOSE-CAPILLARY: 74 mg/dL (ref 65–99)
GLUCOSE-CAPILLARY: 66 mg/dL (ref 65–99)
GLUCOSE-CAPILLARY: 118 mg/dL — AB (ref 65–99)
Glucose-Capillary: 77 mg/dL (ref 65–99)

## 2017-06-06 MED ORDER — JEVITY 1.2 CAL PO LIQD
1000.0000 mL | ORAL | Status: DC
  Filled 2017-06-06: qty 1000

## 2017-06-06 MED ORDER — DEXTROSE 50 % IV SOLN
INTRAVENOUS | Status: AC
  Administered 2017-06-06: 50 mL
  Filled 2017-06-06: qty 50

## 2017-06-06 MED ORDER — JEVITY 1.5 CAL/FIBER PO LIQD
1000.0000 mL | ORAL | Status: DC
  Filled 2017-06-06: qty 1000

## 2017-06-06 NOTE — [place] FL2 (Signed)
[place] MEDICAID FL2 LEVEL OF CARE SCREENING TOOL     IDENTIFICATION  Patient Name: Alexander [name] Birthdate: 12-05-1955 Sex: male Admission Date (Current Location): 06/04/2017  [facility] [place]   Facility and Address:  [facility] [place], [address], [place], [place] [address]      Provider Number: [id number]  Attending Physician Name and Address:  Daisy FloroWinter, Christopher Aaro*  Relative Name and Phone Number:  Nona DellOra Elem [phone]    Current Level of Care: Hospital Recommended Level of Care: Skilled Nursing Facility Prior Approval Number:    Date Approved/Denied:   PASRR Number: [id number] [id number]  Discharge Plan: SNF    Current Diagnoses: Patient Active Problem List   Diagnosis Date Noted  . Urinary tract obstruction due to kidney stone 06/05/2017  . Kidney stone   . Sepsis (HCC)   . Chronic indwelling Foley catheter   . Acute lower UTI   . Urinary tract infection with hematuria   . Bacteremia   . Suicide attempt (HCC) 03/15/2017  . Chronic pain syndrome 03/08/2017  . At risk for adverse drug event 03/08/2017  . Acute respiratory failure with hypoxia (HCC)   . Pressure injury of skin 03/01/2017  . Septic shock (HCC) 02/28/2017  . Acute respiratory failure with hypoxemia (HCC) 02/28/2017  . Altered mental status   . Chronic bronchitis (HCC) 10/04/2015  . Constipation 09/22/2015  . Pain in testicle 08/09/2015  . Insomnia 08/02/2015  . Urinary retention 07/26/2015  . BPH (benign prostatic hyperplasia) 07/26/2015  . Dysuria 07/19/2015  . Expressive aphasia 07/19/2015  . Decubitus ulcer of sacral area 11/19/2014  . Seborrhea capitis 11/19/2014  . 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 Hypercholesterolemia without hypertriglyceridemia 11/04/2014  . 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 TBI (traumatic brain injury) (HCC) 05/24/2014  . Diabetes mellitus, type 2 (HCC) 10/10/2013  . HCAP  (healthcare-associated pneumonia) 06/11/2013  . Cervical myelopathy (HCC) 10/09/2012  . C1 cervical fracture (HCC) 10/09/2012  . 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 Seizure (HCC)     Orientation RESPIRATION BLADDER Height & Weight     Self, Situation, Place  Normal Incontinent, Indwelling catheter Weight: 161 lb 13.1 oz (73.4 kg) Height:  6\' 1"  (185.4 cm)  BEHAVIORAL SYMPTOMS/MOOD NEUROLOGICAL BOWEL NUTRITION STATUS      Incontinent(gastrostomy) Feeding tube  AMBULATORY STATUS COMMUNICATION OF NEEDS Skin   Total Care   PU Stage and Appropriate Care(pressure injury stage II sacrum, foam dressing changes every 3 days)                       Personal Care Assistance Level of Assistance  Bathing, Dressing, Feeding Bathing Assistance: Maximum assistance Feeding assistance: Maximum assistance Dressing Assistance: Maximum assistance     Functional Limitations Info  Speech     Speech Info: Impaired(nonverbal)    SPECIAL CARE FACTORS FREQUENCY                       Contractures Contractures Info: Not present    Additional Factors Info  Allergies, Isolation Precautions, Code Status Code Status Info: full code Allergies Info: amoxixillin     Isolation Precautions Info: contact precautions MRSA     Current Medications (06/06/2017):  This is the current hospital active medication list Current Facility-Administered Medications  Medication Dose Route Frequency Provider Last Rate Last Dose  . 0.9 %  sodium chloride infusion   Intravenous Continuous  Rene PaciWinter, Christopher Aaron, MD 125 mL/hr at 06/06/17 1050    . acetaminophen (TYLENOL) tablet 650 mg  650 mg Oral Q6H PRN Opyd, Lavone Neriimothy S, MD       Or  . acetaminophen (TYLENOL) suppository 650 mg  650 mg Rectal Q6H PRN Opyd, Lavone Neriimothy S, MD      . allopurinol (ZYLOPRIM) tablet 300 mg  300 mg Per Tube Daily Opyd, Lavone Neriimothy S, MD   300 mg at 06/06/17 1055  . arformoterol  (BROVANA) nebulizer solution 15 mcg  15 mcg Nebulization BID Briscoe Deutscherpyd, Timothy S, MD   15 mcg at 06/06/17 0856  . busPIRone (BUSPAR) tablet 15 mg  15 mg Per Tube BID Opyd, Lavone Neriimothy S, MD   15 mg at 06/06/17 1055  . celecoxib (CELEBREX) capsule 200 mg  200 mg Per Tube Daily Opyd, Lavone Neriimothy S, MD   200 mg at 06/06/17 1056  . feeding supplement (JEVITY 1.5 CAL/FIBER) liquid 1,000 mL  1,000 mL Per Tube Q24H Rene PaciWinter, Christopher Aaron, MD      . finasteride (PROSCAR) tablet 5 mg  5 mg Oral Daily Opyd, Lavone Neriimothy S, MD   5 mg at 06/06/17 1056  . fluticasone (FLONASE) 50 MCG/ACT nasal spray 1 spray  1 spray Each Nare Daily Opyd, Timothy S, MD      . ipratropium-albuterol (DUONEB) 0.5-2.5 (3) MG/3ML nebulizer solution 3 mL  3 mL Nebulization Q6H PRN Opyd, Lavone Neriimothy S, MD      . levETIRAcetam (KEPPRA) 100 MG/ML solution 250 mg  250 mg Per Tube BID Opyd, Lavone Neriimothy S, MD   250 mg at 06/06/17 1057  . lisinopril (PRINIVIL,ZESTRIL) tablet 2.5 mg  2.5 mg Oral Daily Opyd, Lavone Neriimothy S, MD   2.5 mg at 06/06/17 1056  . morphine 4 MG/ML injection 4 mg  4 mg Intravenous Q3H PRN Opyd, Lavone Neriimothy S, MD   4 mg at 06/06/17 1106  . ondansetron (ZOFRAN) tablet 4 mg  4 mg Oral Q6H PRN Opyd, Lavone Neriimothy S, MD       Or  . ondansetron (ZOFRAN) injection 4 mg  4 mg Intravenous Q6H PRN Opyd, Lavone Neriimothy S, MD      . OXcarbazepine (TRILEPTAL) 300 MG/5ML suspension 300 mg  300 mg Per Tube QHS Opyd, Lavone Neriimothy S, MD   300 mg at 06/05/17 2252  . oxyCODONE (Oxy IR/ROXICODONE) immediate release tablet 10 mg  10 mg Per Tube Q8H PRN Opyd, Lavone Neriimothy S, MD   10 mg at 06/05/17 1907  . pantoprazole sodium (PROTONIX) 40 mg/20 mL oral suspension 40 mg  40 mg Per Tube Daily Standley BrookingGoodrich, Daniel P, MD   40 mg at 06/06/17 1057  . polyethylene glycol (MIRALAX / GLYCOLAX) packet 17 g  17 g Per Tube Daily Opyd, Lavone Neriimothy S, MD   17 g at 06/06/17 1057  . polyvinyl alcohol (LIQUIFILM TEARS) 1.4 % ophthalmic solution 1 drop  1 drop Both Eyes Daily Winter, Christopher Aaron, MD   1 drop at  06/06/17 1058  . saccharomyces boulardii (FLORASTOR) capsule 250 mg  250 mg Oral BID Opyd, Lavone Neriimothy S, MD   250 mg at 06/06/17 1056  . tiotropium (SPIRIVA) inhalation capsule 18 mcg  18 mcg Inhalation Daily Opyd, Lavone Neriimothy S, MD   18 mcg at 06/06/17 0856  . topiramate (TOPAMAX) tablet 100 mg  100 mg Oral BID Opyd, Lavone Neriimothy S, MD   100 mg at 06/06/17 1056  . traZODone (DESYREL) tablet 100 mg  100 mg Per Tube QHS Opyd, Lavone Neriimothy S, MD   100 mg  at 06/05/17 2252     Discharge Medications: Please see discharge summary for a list of discharge medications.  Relevant Imaging Results:  Relevant Lab Results:   Additional Information SS#: [id number] Valentina Lucks, LCSW

## 2017-06-06 NOTE — Progress Notes (Signed)
[facility] today- both pt and his family and facility agreeable. (Full assessment to follow) Report # [id number]. All information provided to facility via the HUB. Arranged [facility] transport.  Ilean SkillMeghan Arnola Crittendon, MSW, LCSW Clinical Social Work 06/06/2017 [phone]

## 2017-06-06 NOTE — Progress Notes (Signed)
Initial Nutrition Assessment  DOCUMENTATION CODES:   Not applicable  INTERVENTION:   Jevity 1.5 @ 60 ml/hr To provide: 2160 calories, 92 g protein, and 1094 ml free water.  Provide 200 ml water flushes Q6 hours once IVF d/c  NUTRITION DIAGNOSIS:   Increased nutrient needs related to wound healing as evidenced by estimated needs.  GOAL:   Patient will meet greater than or equal to 90% of their needs  MONITOR:   TF tolerance, Labs, I & O's, Weight trends  REASON FOR ASSESSMENT:   Consult Enteral/tube feeding initiation and management  ASSESSMENT:   Pt with PMH significant for DM, HTN, seizure, HLD, CNS degenerative disease, keratoconus, supranuclear palsy, cervical myelopathy, depression, CVA, asp PNA, s/p PEG, TBI. Recently admitted 11/17 from SNF with low blood pressure and recurrent aspiration PNA and pressure wounds.Presents this admission with complaints of left flank and groin pain. Pt found to have obstructing left ureteral stone. Pt is s/p cystoscopy, left ureteroscopy, laser lithotripsy, and left JJ stent placement on 11/4.   Pt unable to speak upon assessment.  Spoke with nurse who reports pt is on chronic tube feeding at SNF.  Chart review showed pt receives Isosource 1.5 @ 60 ml/hr. This regimen provides 2160 calories, 98 g protein, and 1100 free water.  We do not have this product but have the comparable. See recommendations above.  Will monitor for toleration closely.  Weight noted to be decreased by 2 lb since last admission. Do not suspect any recent weight loss.  Nutrition-Focused physical exam completed. Pt shows to have moderate muscle depletions. Suspect this is from immobility as pt has been meeting his energy requirements for > 1 month.  Pt would like to be transferred to different SNF facility.   Medications reviewed and include: NS @ 125 ml/hr Labs reviewed.   NUTRITION - FOCUSED PHYSICAL EXAM:    Most Recent Value  Orbital Region  Moderate  depletion  Upper Arm Region  Moderate depletion  Thoracic and Lumbar Region  Unable to assess  Buccal Region  Moderate depletion  Temple Region  Moderate depletion  Clavicle Bone Region  Moderate depletion  Clavicle and Acromion Bone Region  Moderate depletion  Scapular Bone Region  Unable to assess  Dorsal Hand  Mild depletion  Patellar Region  Mild depletion  Anterior Thigh Region  Mild depletion  Posterior Calf Region  Mild depletion  Edema (RD Assessment)  None  Hair  Reviewed  Eyes  Reviewed  Mouth  Reviewed  Skin  Reviewed  Nails  Reviewed       Diet Order:  Diet NPO time specified  EDUCATION NEEDS:   No education needs have been identified at this time  Skin:  Skin Assessment: Skin Integrity Issues: Skin Integrity Issues:: Stage II Stage II: sacrum  Last BM:  PTA  Height:   Ht Readings from Last 1 Encounters:  06/05/17 6\' 1"  (1.854 m)    Weight: 1900  Wt Readings from Last 1 Encounters:  06/06/17 161 lb 13.1 oz (73.4 kg)    Ideal Body Weight:  83.6 kg  BMI:  Body mass index is 21.35 kg/m.  Estimated Nutritional Needs:   Kcal:  1900-2100 kcal/day  Protein:  100-110 g/day  Fluid:  >1.9 L/day    Vanessa Kickarly Hartleigh Edmonston RD, LDN Clinical Nutrition Pager # [phone]

## 2017-06-06 NOTE — Care Management Obs Status (Signed)
MEDICARE OBSERVATION STATUS NOTIFICATION   Patient Details  Name: Alexander [name] MRN: [id number] Date of Birth: 10/19/1955   Medicare Observation Status Notification Given:  Yes    Geni BersMcGibboney, Zanyia Silbaugh, RN 06/06/2017, 3:13 PM

## 2017-06-06 NOTE — Progress Notes (Signed)
Hypoglycemic Event  CBG: 66  Treatment: 25 ml of D50  Symptoms:No symptoms it was routine CBG check  Follow-up CBG: 1805 CBG Result:118  Possible Reasons for Event: Tube feeding was off Comments/MD notified :Pt was asymptomatic and responded quickly so MD not notified    [name] A Jamilex Bohnsack

## 2017-06-06 NOTE — Discharge Summary (Addendum)
Discharge Summary  Prentice DockerMartin [name] Karapetyan YNW:[id number] DOB: 1955/12/13  PCP: [name], Gerardo E, MD  Admit date: 06/04/2017 Discharge date: 06/06/2017  Time spent: 25 minutes  Recommendations for Outpatient Follow-up:  1. Follow up with urology in 2 weeks 2. Follow up with your PCP within [name] week 3. Please take your medications as prescribed 4. Urology office visit in 2 weeks 06/19/17 for cystoscopy and left JJ stent removal 5. Fall precaution  Discharge Diagnoses:  Active Hospital Problems   Diagnosis Date Noted  . Urinary tract obstruction due to kidney stone 06/05/2017  . Chronic indwelling Foley catheter   . TBI (traumatic brain injury) (HCC) 05/24/2014  . Diabetes mellitus, type 2 (HCC) 10/10/2013  . 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 Seizure [facility](HCC)     Resolved Hospital Problems  No resolved problems to display.    Discharge Condition: stable  Diet recommendation: Resume peg tube feeeding  Vitals:   06/06/17 0857 06/06/17 1050  BP:  105/68  Pulse:    Resp:    Temp:    SpO2: 96%     History of present illness:  Alexander [name] Profittis [name] 61 y.o.malewith medical history significant fortraumatic brain injury with chronic Foley catheter and feeding tube, depression, seizure disorder, GERD, COPD, and recent admission with septic shock secondary to UTI, now presenting to the emergency department from his nursing facility for evaluation of severe pain in the left groin and flank.   [name]  Pt seen and examined at his bedside. He is in no acute distress.  POD #1 Post cystoscopy/ureteroscopy/holmium laser/stent placement.  On the day of discharge the patient was hemodynamically stable. Patient will need to follow up with his urologist within 2 weeks. Will also need to follow up with his PCP within [name] week.   Hospital Course:  Principal Problem:  Urinary tract obstruction due to kidney stone Active Problems:   Seizure (HCC)   TBI (traumatic brain injury) (HCC)   Major depressive disorder, single episode, severe without psychosis (HCC)   Diabetes mellitus, type 2 (HCC)   Chronic indwelling Foley catheter  1. Obstructing left ureteral stone Post cystoscopy/ureteroscopy/holmium laser/stent placement. - Pt presents with severe pain in left flank and groin, found to have obstructing left ureteral stone causing left-sided hydronephrosis  - No fever or leukocytosis and UA not suggestive of infection  - Urology consulting with surgical intervention 06/05/17.   2. Seizure disorder  -stable -Continue suppression with Keppra, Trileptal, and Topomax   3. Depression, anxiety, insomnia   - Appears to be stable  - Continue Buspar, trazodone, antiepileptics    4. Hypertension  - BP at goal  - Continue lisinopril    5. GERD - stable -No EGD report on file  - Managed with PPI at home, will continue    6. Anemia of chronic disease  - stable -no overt sign of bleeding -Hgb is 11.9 on admission, better than recent priors     Procedures: POD #1 post cystoscopy/ureteroscopy/holmium laser/stent placement.  Consultations:  Urology  Discharge Exam: BP 105/68   Pulse 83   Temp 98.4 F (36.9 C) (Oral)   Resp 15   Ht 6\' 1"  (1.854 m)   Wt 73.4 kg (161 lb 13.1 oz)   SpO2 96%   BMI 21.35 kg/m   General: 61 yo CM WD WN NAD. Non verbal Cardiovascular: RRR n rubs or gallops Respiratory: CTA with no rales  or wheezes  Discharge Instructions You were cared for by [name] hospitalist during your hospital stay. If you have any questions about your discharge medications or the care you received while you were in the hospital after you are discharged, you can call the unit and asked to speak with the hospitalist on call if the hospitalist that took care of you is not available. Once you are discharged, your primary care physician will handle  any further medical issues. Please note that NO REFILLS for any discharge medications will be authorized once you are discharged, as it is imperative that you return to your primary care physician (or establish [name] relationship with [name] primary care physician if you do not have one) for your aftercare needs so that they can reassess your need for medications and monitor your lab values.   Allergies as of 06/06/2017      Reactions   Amoxicillin Itching, Rash, Other (See Comments)   NOTED ON MAR Has patient had [name] PCN reaction causing immediate rash, facial/tongue/throat swelling, SOB or lightheadedness with hypotension: Unknown Has patient had [name] PCN reaction causing severe rash involving mucus membranes or skin necrosis: Unknown Has patient had [name] PCN reaction that required hospitalization Unknown Has patient had [name] PCN reaction occurring within the last 10 years: Unknown If all of the above answers are "NO", then may proceed with Cephalosporin use.      Medication List    STOP taking these medications   Oxycodone HCl 10 MG Tabs     TAKE these medications   acetaminophen 650 MG CR tablet Commonly known as:  TYLENOL Take 650 mg by mouth every 4 (four) hours as needed for pain.   allopurinol 300 MG tablet Commonly known as:  ZYLOPRIM 300 mg by PEG Tube route daily.   BIOFREEZE EX Apply 1 application topically every 8 (eight) hours. Apply to left shoulder for pain   busPIRone 15 MG tablet Commonly known as:  BUSPAR Place 15 mg 2 (two) times daily into feeding tube.   carboxymethylcellulose 0.5 % Soln Commonly known as:  REFRESH PLUS Place 1 drop into both eyes daily.   celecoxib 200 MG capsule Commonly known as:  CELEBREX Place 200 mg into feeding tube daily.   finasteride 5 MG tablet Commonly known as:  PROSCAR 5 mg See admin instructions. Take 1 tablet (5mg ) via tube once daily (may crush)   fluticasone 50 MCG/ACT nasal spray Commonly known as:  FLONASE Place 1 spray into both  nostrils daily.   ipratropium-albuterol 0.5-2.5 (3) MG/3ML Soln Commonly known as:  DUONEB Take 3 mLs by nebulization every 6 (six) hours as needed (for shortness of breath).   ISOSOURCE 1.5 CAL Liqd Give 50 mLs by tube continuous. 50 mL/hr x24 hours. If tolerated, increase to goal of 76ml/hr via pump   levETIRAcetam 100 MG/ML solution Commonly known as:  KEPPRA Place 250 mg into feeding tube 2 (two) times daily.   lidocaine 5 % ointment Commonly known as:  XYLOCAINE Apply 1 application topically daily. Applied around G-Tube   lisinopril 2.5 MG tablet Commonly known as:  PRINIVIL,ZESTRIL Take 2.5 mg by mouth daily.   Melatonin 10 MG Tabs Give 10 mg by tube at bedtime.   MUCINEX FAST-MAX 10-650-400 MG/20ML Liqd Generic drug:  Phenylephrine-APAP-Guaifenesin Place 15 mLs into feeding tube every 12 (twelve) hours.   omeprazole 2 mg/mL Susp Commonly known as:  PRILOSEC Place 40 mg into feeding tube daily.   OXcarbazepine 300 MG/5ML suspension Commonly known as:  TRILEPTAL Place 300 mg at bedtime into feeding tube.   polyethylene glycol packet Commonly known as:  MIRALAX / GLYCOLAX Place 17 g into feeding tube daily. Mix with 8 oz water and place in tube every night at bedtime   saccharomyces boulardii 250 MG capsule Commonly known as:  FLORASTOR Take 1 capsule (250 mg total) by mouth 2 (two) times daily.   salmeterol 50 MCG/DOSE diskus inhaler Commonly known as:  SEREVENT Inhale 1 puff into the lungs 2 (two) times daily.   SYSTANE 0.4-0.3 % Soln Generic drug:  Polyethyl Glycol-Propyl Glycol Apply 1 drop to eye daily. Both eyes   tiotropium 18 MCG inhalation capsule Commonly known as:  SPIRIVA Place 18 mcg into inhaler and inhale daily.   topiramate 100 MG tablet Commonly known as:  TOPAMAX Take 1 tablet (100 mg total) by mouth 2 (two) times daily. PEG tube   traZODone 100 MG tablet Commonly known as:  DESYREL 100 mg by PEG Tube route at bedtime.   Vitamin D3  50000 units Caps Give 1 capsule by tube every 30 (thirty) days.      Allergies  Allergen Reactions  . Amoxicillin Itching, Rash and Other (See Comments)    NOTED ON MAR Has patient had [name] PCN reaction causing immediate rash, facial/tongue/throat swelling, SOB or lightheadedness with hypotension: Unknown Has patient had [name] PCN reaction causing severe rash involving mucus membranes or skin necrosis: Unknown Has patient had [name] PCN reaction that required hospitalization Unknown Has patient had [name] PCN reaction occurring within the last 10 years: Unknown If all of the above answers are "NO", then may proceed with Cephalosporin use.    Follow-up Information    Justin Mend, MD Follow up in 1 week(s).   Specialty:  Internal Medicine Why:  As needed Contact information: [address] [place] [address] [phone]            The results of significant diagnostics from this hospitalization (including imaging, microbiology, ancillary and laboratory) are listed below for reference.    Significant Diagnostic Studies: Dg Chest 2 View  Result Date: 06/05/2017 CLINICAL DATA:  Initial evaluation for coarse breath sounds. EXAM: CHEST  2 VIEW COMPARISON:  Prior radiograph from 05/24/2017. FINDINGS: Mild cardiomegaly, stable. Mediastinal silhouette within normal limits. Aortic atherosclerosis. Lungs hypoinflated. Patchy and linear bibasilar opacities, favored to reflect atelectasis, although small infiltrates not excluded, particularly at the right lung base. Trace left pleural effusion noted. No pulmonary edema. No pneumothorax. No acute osseus abnormality. Remotely healed bilateral rib fractures noted. IMPRESSION: 1. Shallow lung inflation with patchy and linear bibasilar opacities. Atelectasis is favored, although infectious infiltrate could be considered in the correct clinical setting, particularly at the right lung base. 2. Small left pleural effusion. 3. Stable cardiomegaly  without pulmonary edema. Electronically Signed   By: Rise Mu M.D.   On: 06/05/2017 02:07   Ct Abdomen Pelvis W Contrast  Result Date: 06/05/2017 CLINICAL DATA:  60 year old male with abdominal distention and left groin pain. EXAM: CT ABDOMEN AND PELVIS WITH CONTRAST TECHNIQUE: Multidetector CT imaging of the abdomen and pelvis was performed using the standard protocol following bolus administration of intravenous contrast. CONTRAST:  ISOVUE-300 IOPAMIDOL (ISOVUE-300) INJECTION 61% COMPARISON:  Testicular ultrasound dated 06/04/2017 and CT of the abdomen pelvis dated 02/05/2016 FINDINGS: Lower chest: Partially visualized small bilateral pleural effusions. The left pleural effusion appears new compared to the prior CT. There are bibasilar atelectatic changes. Pneumonia is not excluded. Clinical correlation is recommended. Multi  vessel coronary vascular calcification. No intra-abdominal free air or free fluid. Hepatobiliary: Cholecystectomy. Mild dilatation of the intrahepatic biliary tree and CBD, possibly post cholecystectomy. The liver is unremarkable. Pancreas: Unremarkable. No pancreatic ductal dilatation or surrounding inflammatory changes. Spleen: Normal in size without focal abnormality. Adrenals/Urinary Tract: There is [name] 15 mm left adrenal adenoma similar to prior CT. The right adrenal gland is unremarkable. There are two stones in the proximal left ureter. The more distal stone measures 9 mm causing obstruction of the ureter. The more proximal stone measures 7 mm and is partially obstructing. There is mild left hydronephrosis, new compared to the prior CT. Multiple nonobstructing calculi noted in the inferior pole of the left kidney. There is [name] 5 mm nonobstructing stone in the inferior pole of the right kidney. There is no hydronephrosis on the right. The right ureter appears unremarkable. The urinary bladder is decompressed around [name] Foley catheter. Small amount of air within the  urinary bladder likely introduced via Foley catheter. There is delayed enhancement and excretion of the contrast by the left kidney. Stomach/Bowel: There is [name] 2.3 cm duodenal diverticulum. [name] percutaneous gastrostomy is noted with balloon in the body of the stomach. Moderate stool noted throughout the colon. There is no bowel obstruction or active inflammation. The appendix is normal. Vascular/Lymphatic: Moderate aortoiliac atherosclerotic disease. No portal venous gas. There is no adenopathy. Reproductive: The prostate gland is grossly unremarkable. Other: Small fat containing umbilical hernia. Musculoskeletal: Fatty atrophy of the paraspinal and pelvic musculature. There is osteopenia with multilevel degenerative changes of the spine. Sclerotic changes of the femoral heads. No acute osseous pathology. IMPRESSION: 1. Two stones in the proximal left ureter. The more distal stone measures approximately 9 mm and is obstructing causing mild left hydronephrosis. Multiple left renal inferior pole as well as [name] 5 mm right renal lower pole nonobstructing calculi noted. There is no hydronephrosis on the right. 2. No bowel obstruction or active inflammation. 3. Cholecystectomy. 4.  Aortic Atherosclerosis (ICD10-I70.0). Electronically Signed   By: Elgie Collard M.D.   On: 06/05/2017 02:09   Dg Chest Port 1 View  Result Date: 05/24/2017 CLINICAL DATA:  Respiratory failure EXAM: PORTABLE CHEST 1 VIEW COMPARISON:  05/23/2017 FINDINGS: Endotracheal tube removed. Mild improvement in left lower lobe airspace disease. Progressive atelectasis/ consolidation right lower lobe. Small bilateral effusions unchanged IMPRESSION: Bibasilar airspace disease, with progression on the right and mild improvement on the left. Endotracheal tube removed. Electronically Signed   By: Marlan Palau M.D.   On: 05/24/2017 07:11   Dg Chest Port 1 View  Result Date: 05/23/2017 CLINICAL DATA:  Hypoxia EXAM: PORTABLE CHEST 1 VIEW COMPARISON:   May 22, 2017 FINDINGS: Endotracheal tube tip is 3.4 cm above the carina. No pneumothorax. There are small pleural effusions bilaterally with mild bibasilar atelectasis. There is no frank edema or consolidation. Heart is upper normal in size with pulmonary vascularity within normal limits. No adenopathy. There is aortic atherosclerosis. There is an old healed fracture of the anterior left second rib. There is postoperative change in the region of the coracoid process of the left scapula. IMPRESSION: Small pleural effusions with mild bibasilar atelectasis. No airspace consolidation. Stable cardiac silhouette. There is aortic atherosclerosis. Endotracheal tube as described without evident pneumothorax. Aortic Atherosclerosis (ICD10-I70.0). Electronically Signed   By: Bretta Bang III M.D.   On: 05/23/2017 07:33   Dg Chest Port 1 View  Result Date: 05/22/2017 CLINICAL DATA:  Respiratory failure EXAM: PORTABLE CHEST 1 VIEW COMPARISON:  Yesterday  FINDINGS: Endotracheal tube tip between the clavicular heads and carina. Low volume chest with hazy opacities at the bases, improved from prior with better visualized diaphragm. No effusion or pneumothorax. Grossly stable mediastinal contours allowing for rightward rotation. IMPRESSION: 1. Atelectasis or pneumonia at the bases of the low volume lungs. Aeration is improved from yesterday. 2. Stable endotracheal tube position. Electronically Signed   By: Marnee SpringJonathon  Watts M.D.   On: 05/22/2017 08:30   Dg Chest Port 1 View  Result Date: 05/21/2017 CLINICAL DATA:  Hypoxia EXAM: PORTABLE CHEST 1 VIEW COMPARISON:  May 20, 2017 FINDINGS: Endotracheal tube tip is 2.9 cm above the carina. No pneumothorax. [name] skin fold is noted on the right. There are pleural effusions bilaterally with patchy airspace consolidation in the bases. Heart is upper normal in size. There is mild pulmonary venous hypertension. No adenopathy. There old healed rib fractures on the right,  unchanged. IMPRESSION: Endotracheal tube as described without pneumothorax. Small pleural effusions with patchy airspace opacity, likely pneumonia, in the bases. There is felt to be [name] degree of underlying pulmonary vascular congestion. Skin fold noted on the right. Electronically Signed   By: Bretta BangWilliam  Woodruff III M.D.   On: 05/21/2017 07:08   Dg Chest Port 1 View  Result Date: 05/20/2017 CLINICAL DATA:  Respiratory failure EXAM: PORTABLE CHEST 1 VIEW COMPARISON:  05/19/2017 FINDINGS: Endotracheal tube is in stable position. Heart is borderline in size. Vascular congestion with interstitial prominence, likely mild interstitial edema. Right base atelectasis or infiltrate. Suspect small effusions. IMPRESSION: Mild interstitial prominence concerning for interstitial edema. Right lower lobe atelectasis or infiltrate, similar prior study. Small bilateral effusions. Electronically Signed   By: Charlett NoseKevin  Dover M.D.   On: 05/20/2017 08:17   Dg Chest Port 1 View  Result Date: 05/19/2017 CLINICAL DATA:  ET tube placement.  Respiratory failure with hypoxia EXAM: PORTABLE CHEST 1 VIEW COMPARISON:  05/19/2017 FINDINGS: Endotracheal tube has been retracted. The tip is 3 cm above the carina. Bibasilar atelectasis again noted, unchanged. Heart is normal size. No visible effusions. IMPRESSION: Endotracheal tube 3 cm above the carina. Continued bibasilar atelectasis. Electronically Signed   By: Charlett NoseKevin  Dover M.D.   On: 05/19/2017 11:48   Dg Chest Portable 1 View  Result Date: 05/19/2017 CLINICAL DATA:  ET tube placement EXAM: PORTABLE CHEST 1 VIEW COMPARISON:  05/19/2017 FINDINGS: Endotracheal tube is in the right mainstem bronchus. Recommend retracting approximately 3 cm. Heart is normal size. Bibasilar opacities, likely atelectasis. No acute bony abnormality. IMPRESSION: Endotracheal tube in the right mainstem bronchus. Recommend retracting approximately 3 cm. Bibasilar atelectasis. Electronically Signed   By: Charlett NoseKevin   Dover M.D.   On: 05/19/2017 11:48   Dg Chest Portable 1 View  Result Date: 05/19/2017 CLINICAL DATA:  Encounter for shortness of Breath EXAM: PORTABLE CHEST 1 VIEW COMPARISON:  03/03/2017 FINDINGS: Interval removal of left central line. There is bibasilar atelectasis. Low lung volumes. Heart is normal size. Possible small effusions. IMPRESSION: Low lung volumes with bibasilar atelectasis and possible small effusions. Electronically Signed   By: Charlett NoseKevin  Dover M.D.   On: 05/19/2017 07:33   Dg Abd Portable 1v  Result Date: 05/19/2017 CLINICAL DATA:  Possible ileus. Possible sepsis. Pt has [name] peg tube with redness and draining around the site. EXAM: PORTABLE ABDOMEN - 1 VIEW COMPARISON:  04/14/2017 FINDINGS: Balloon retention gastrostomy catheter projects in expected location. Stomach and small bowel decompressed. Moderate colonic and rectal fecal material. Cholecystectomy clips. Degenerative changes in the lumbar spine and hips. IMPRESSION: 1.  Nonobstructive bowel gas pattern with moderate colonic and rectal fecal material. Electronically Signed   By: Corlis Leak M.D.   On: 05/19/2017 12:45   Dg C-arm 1-60 Min-no Report  Result Date: 06/05/2017 Fluoroscopy was utilized by the requesting physician.  No radiographic interpretation.   [place] Scrotom W/doppler  Result Date: 06/05/2017 CLINICAL DATA:  Initial evaluation for acute left testicular pain. EXAM: SCROTAL ULTRASOUND DOPPLER ULTRASOUND OF THE TESTICLES TECHNIQUE: Complete ultrasound examination of the testicles, epididymis, and other scrotal structures was performed. Color and spectral Doppler ultrasound were also utilized to evaluate blood flow to the testicles. COMPARISON:  Prior ultrasound from 03/15/2016. FINDINGS: Right testicle Measurements: 3.3 x 2.1 x 2.1 cm. No mass or microlithiasis visualized. Left testicle Measurements: 3.6 x 2.4 x 2.1 cm. No mass or microlithiasis visualized. Right epididymis:  Normal in size and appearance. Left epididymis:  Normal in size and appearance. Left epididymal cyst measuring 1.4 x 1.3 x 1.3 cm noted. Hydrocele:  None visualized. Varicocele:  None visualized. Pulsed Doppler interrogation of both testes demonstrates normal low resistance arterial and venous waveforms bilaterally. IMPRESSION: 1. 1.4 x 1.3 x 1.3 cm simple left epididymal cyst. 2. Otherwise unremarkable testicular ultrasound. Electronically Signed   By: Rise Mu M.D.   On: 06/05/2017 00:10    Microbiology: No results found for this or any previous visit (from the past 240 hour(s)).   Labs: Basic Metabolic Panel: Recent Labs  Lab 06/04/17 2225 06/05/17 0500 06/06/17 0411  NA 135 134* 141  K 4.2 4.3 3.8  CL 100* 99* 111  CO2 27 23 24   GLUCOSE 89 101* 77  BUN 32* 32* 26*  CREATININE 0.95 1.02 0.72  CALCIUM 9.3 9.1 8.9   Liver Function Tests: Recent Labs  Lab 06/04/17 2225  AST 18  ALT 11*  ALKPHOS 94  BILITOT 0.2*  PROT 7.3  ALBUMIN 3.6   No results for input(s): LIPASE, AMYLASE in the last 168 hours. No results for input(s): AMMONIA in the last 168 hours. CBC: Recent Labs  Lab 06/04/17 2225 06/05/17 0500 06/06/17 0411  WBC 8.6 8.6 6.6  NEUTROABS 5.6 5.5  --   HGB 11.9* 11.8* 10.7*  HCT 37.8* 37.2* 33.6*  MCV 99.2 100.3* 100.3*  PLT 299 258 208   Cardiac Enzymes: No results for input(s): CKTOTAL, CKMB, CKMBINDEX, TROPONINI in the last 168 hours. BNP: BNP (last 3 results) Recent Labs    05/19/17 1516  BNP 204.3*    ProBNP (last 3 results) No results for input(s): PROBNP in the last 8760 hours.  CBG: Recent Labs  Lab 06/05/17 1631 06/06/17 0014 06/06/17 0431 06/06/17 0739 06/06/17 1212  GLUCAP 79 77 81 76 74       Signed:  Darlin Drop, MD [facility] 06/06/2017, 2:28 PM
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
## 2017-06-06 NOTE — Progress Notes (Signed)
[facility] Nurse attempted to call and give report to facility twice with no answer. [name] A Adelis Docter, RN

## 2017-06-06 NOTE — Discharge Instructions (Signed)
Dietary Guidelines to Help Prevent Kidney Stones Kidney stones are deposits of minerals and salts that form inside your kidneys. Your risk of developing kidney stones may be greater depending on your diet, your lifestyle, the medicines you take, and whether you have certain medical conditions. Most people can reduce their chances of developing kidney stones by following the instructions below. Depending on your overall health and the type of kidney stones you tend to develop, your dietitian may give you more specific instructions. What are tips for following this plan? Reading food labels   Choose foods with "no salt added" or "low-salt" labels. Limit your sodium intake to less than 1500 mg per day.  Choose foods with calcium for each meal and snack. Try to eat about 300 mg of calcium at each meal. Foods that contain 200-500 mg of calcium per serving include:  8 oz (237 ml) of milk, fortified nondairy milk, and fortified fruit juice.  8 oz (237 ml) of kefir, yogurt, and soy yogurt.  4 oz (118 ml) of tofu.  1 oz of cheese.  1 cup (300 g) of dried figs.  1 cup (91 g) of cooked broccoli.  1-3 oz can of sardines or mackerel.  Most people need 1000 to 1500 mg of calcium each day. Talk to your dietitian about how much calcium is recommended for you. Shopping   Buy plenty of fresh fruits and vegetables. Most people do not need to avoid fruits and vegetables, even if they contain nutrients that may contribute to kidney stones.  When shopping for convenience foods, choose:  Whole pieces of fruit.  Premade salads with dressing on the side.  Low-fat fruit and yogurt smoothies.  Avoid buying frozen meals or prepared deli foods.  Look for foods with live cultures, such as yogurt and kefir. Cooking   Do not add salt to food when cooking. Place a salt shaker on the table and allow each person to add his or her own salt to taste.  Use vegetable protein, such as beans, textured vegetable  protein (TVP), or tofu instead of meat in pasta, casseroles, and soups. Meal planning   Eat less salt, if told by your dietitian. To do this:  Avoid eating processed or premade food.  Avoid eating fast food.  Eat less animal protein, including cheese, meat, poultry, or fish, if told by your dietitian. To do this:  Limit the number of times you have meat, poultry, fish, or cheese each week. Eat a diet free of meat at least 2 days a week.  Eat only one serving each day of meat, poultry, fish, or seafood.  When you prepare animal protein, cut pieces into small portion sizes. For most meat and fish, one serving is about the size of one deck of cards.  Eat at least 5 servings of fresh fruits and vegetables each day. To do this:  Keep fruits and vegetables on hand for snacks.  Eat 1 piece of fruit or a handful of berries with breakfast.  Have a salad and fruit at lunch.  Have two kinds of vegetables at dinner.  Limit foods that are high in a substance called oxalate. These include:  Spinach.  Rhubarb.  Beets.  Potato chips and french fries.  Nuts.  If you regularly take a diuretic medicine, make sure to eat at least 1-2 fruits or vegetables high in potassium each day. These include:  Avocado.  Banana.  Orange, prune, carrot, or tomato juice.  Baked potato.  Cabbage.    Beans and split peas. General instructions   Drink enough fluid to keep your urine clear or pale yellow. This is the most important thing you can do.  Talk to your health care provider and dietitian about taking daily supplements. Depending on your health and the cause of your kidney stones, you may be advised:  Not to take supplements with vitamin C.  To take a calcium supplement.  To take a daily probiotic supplement.  To take other supplements such as magnesium, fish oil, or vitamin B6.  Take all medicines and supplements as told by your health care provider.  Limit alcohol intake to no  more than 1 drink a day for nonpregnant women and 2 drinks a day for men. One drink equals 12 oz of beer, 5 oz of wine, or 1 oz of hard liquor.  Lose weight if told by your health care provider. Work with your dietitian to find strategies and an eating plan that works best for you. What foods are not recommended? Limit your intake of the following foods, or as told by your dietitian. Talk to your dietitian about specific foods you should avoid based on the type of kidney stones and your overall health. Grains  Breads. Bagels. Rolls. Baked goods. Salted crackers. Cereal. Pasta. Vegetables  Spinach. Rhubarb. Beets. Canned vegetables. Pickles. Olives. Meats and other protein foods  Nuts. Nut butters. Large portions of meat, poultry, or fish. Salted or cured meats. Deli meats. Hot dogs. Sausages. Dairy  Cheese. Beverages  Regular soft drinks. Regular vegetable juice. Seasonings and other foods  Seasoning blends with salt. Salad dressings. Canned soups. Soy sauce. Ketchup. Barbecue sauce. Canned pasta sauce. Casseroles. Pizza. Lasagna. Frozen meals. Potato chips. French fries. Summary  You can reduce your risk of kidney stones by making changes to your diet.  The most important thing you can do is drink enough fluid. You should drink enough fluid to keep your urine clear or pale yellow.  Ask your health care provider or dietitian how much protein from animal sources you should eat each day, and also how much salt and calcium you should have each day. This information is not intended to replace advice given to you by your health care provider. Make sure you discuss any questions you have with your health care provider. Document Released: 10/14/2010 Document Revised: 05/30/2016 Document Reviewed: 05/30/2016 Elsevier Interactive Patient Education  2017 Elsevier Inc.  

## 2017-06-08 LAB — CBC AND DIFFERENTIAL
HEMATOCRIT: 30 — AB (ref 41–53)
HEMOGLOBIN: 9.9 — AB (ref 13.5–17.5)
NEUTROS ABS: 3
PLATELETS: 164 (ref 150–399)
WBC: 4.7

## 2017-06-08 LAB — HEPATIC FUNCTION PANEL
ALT: 8 — AB (ref 10–40)
AST: 13 — AB (ref 14–40)
Alkaline Phosphatase: 97 (ref 25–125)
Bilirubin, Total: 0.2

## 2017-06-08 LAB — BASIC METABOLIC PANEL
BUN: 24 — AB (ref 4–21)
Creatinine: 0.5 — AB (ref 0.6–1.3)
GLUCOSE: 119
Potassium: 4.2 (ref 3.4–5.3)
SODIUM: 141 (ref 137–147)

## 2017-06-08 LAB — VITAMIN D 25 HYDROXY (VIT D DEFICIENCY, FRACTURES): Vit D, 25-Hydroxy: 60

## 2017-06-08 LAB — LIPID PANEL
CHOLESTEROL: 93 (ref 0–200)
HDL: 40 (ref 35–70)
LDL Cholesterol: 40
LDL/HDL RATIO: 2.3
TRIGLYCERIDES: 68 (ref 40–160)

## 2017-06-08 LAB — VITAMIN B12: VITAMIN B 12: 1217

## 2017-06-08 LAB — TSH: TSH: 0.17 — AB (ref 0.41–5.90)

## 2017-06-09 NOTE — Anesthesia Postprocedure Evaluation (Signed)
Anesthesia Post Note  Patient: Alexander [name]  Procedure(s) Performed: CYSTOSCOPY/URETEROSCOPY/RETROGRADE PYELOGRAM/HOLMIUM LASER/STENT PLACEMENT (Left )     Patient location during evaluation: PACU Anesthesia Type: General Level of consciousness: awake and alert Pain management: pain level controlled Vital Signs Assessment: post-procedure vital signs reviewed and stable Respiratory status: spontaneous breathing, nonlabored ventilation, respiratory function stable and patient connected to nasal cannula oxygen Cardiovascular status: blood pressure returned to baseline and stable Postop Assessment: no apparent nausea or vomiting Anesthetic complications: no    Last Vitals:  Vitals:   06/06/17 0857 06/06/17 1050  BP:  105/68  Pulse:    Resp:    Temp:    SpO2: 96%     Last Pain:  Vitals:   06/06/17 1820  TempSrc:   PainSc: Asleep   Pain Goal: Patients Stated Pain Goal: 0 (06/06/17 1750)               Cristela BlueJACKSON,Diania Co EDWARD

## 2017-06-12 ENCOUNTER — Encounter: Payer: Self-pay | Admitting: Internal Medicine

## 2017-06-12 ENCOUNTER — Non-Acute Institutional Stay (SKILLED_NURSING_FACILITY): Payer: Medicare Other | Admitting: Internal Medicine

## 2017-06-12 DIAGNOSIS — N200 Calculus of kidney: Secondary | ICD-10-CM | POA: Diagnosis not present

## 2017-06-12 DIAGNOSIS — N138 Other obstructive and reflux uropathy: Secondary | ICD-10-CM | POA: Diagnosis not present

## 2017-06-12 DIAGNOSIS — R7989 Other specified abnormal findings of blood chemistry: Secondary | ICD-10-CM | POA: Insufficient documentation

## 2017-06-12 NOTE — Assessment & Plan Note (Signed)
Postop follow-up approximately 12/18 with Dr. Liliane ShiWinter, Urology

## 2017-06-12 NOTE — Progress Notes (Signed)
    NURSING HOME LOCATION:  [facility] ROOM NUMBER:  [id number]  CODE STATUS:  DNR  PCP:  Pecola LawlessHopper, Zalmen Wrightsman F, MD  [address] [place] [id number]   This is a nursing facility follow up for Nursing Facility readmission within 30 days  Interim medical record and care since [facility] was updated with review of diagnostic studies and change in clinical status since last visit were documented.  HPI: The patient was readmitted 12/3-12/5/18 with pain in the left groin and flank. The patient underwent cystoscopy, ureteroscopy, holmium laser lithotripsy and left JJ stent placement 12/4 by Dr Cristal Deerhristopher Liliane ShiWinter for calculi with urinary tract obstruction and left sided hydronephrosis. Follow-up 2 weeks following procedure for cystoscopy and left JJ stent removal was requested. The patient had recently been hospitalized with urosepsis. Urine revealed no sign of active infection at this time.  Stable issues include seizure disorder, depression, hypertension, GERD, and anemia of chronic disease. Labs @ discharge were normal except for BUN of 26, hemoglobin 10.7, and hematocrit 33.6. Macrocytosis was present with an MCV of 100.3. Repeat labs at the SNF revealed drop in hemoglobin and hematocrit to 9.9 and 29.8. B12 level is supranormal as is his folic acid. Iron and ferritin were also normal. TSH was 0.17, prior TSH was 4.15 on 03/14/17.  Review of systems: Nonverbal state hindered review of systems completion . He did type out on his word board  "I don't feel good"; there was no further elaboration. He did point to his perineum possibly indicating some discomfort related to the  genitourinary procedure. He may be having some diarrhea but this is not definite.   Physical exam:  Pertinent or positive findings: The most dramatic physical finding is his prominent stare.. He has pattern alopecia & also has a mustache. He is completely edentulous &  his mouth was agape. Heart sounds are  markedly distant & essentially not auscultated. He had mild expiratory rhonchi initially. When he demonstrated incentive spirometry, he exhibited coarse rhonchi and cough. Respiratory effort was extremely poor Bowel sounds are hyperactive. Lower extremities are in support booties. He has no range of motion of lower extremities. The dorsalis pedis pulses are strong. Posterior tibial pulses are decreased. He has contractures of the toes. He has severe contractures of the fingers of the left upper extremity. Contractures are present on the right as well but to lesser extent.  General appearance:no acute distress , increased work of breathing is present.   Lymphatic: No lymphadenopathy about the head, neck, axilla . Eyes: No conjunctival inflammation or lid edema is present. There is no scleral icterus. Ears:  External ear exam shows no significant lesions or deformities.   Nose:  External nasal examination shows no deformity or inflammation. Nasal mucosa are pink and moist without lesions ,exudates Oral exam: lips and gums are healthy appearing.There is no oropharyngeal erythema or exudate . Neck:  No thyromegaly, masses, tenderness noted.    Lungs: without wheezes, rales , rubs. Abdomen:Bowel sounds are normal. Abdomen is soft and nontender with no organomegaly, hernias,masses. GU: deferred  Extremities:  No cyanosis, clubbing,edema  Skin: Warm & dry w/o tenting. No significant lesions or rash.  See summary under each active problem in the Problem List with associated updated therapeutic plan

## 2017-06-12 NOTE — Assessment & Plan Note (Signed)
06/12/17 subclinical hyperthyroidism versus possible lab error Recheck in 7-10 days rather than waiting 6-8 weeks

## 2017-06-12 NOTE — Patient Instructions (Signed)
See assessment and plan under each diagnosis in the problem list and acutely for this visit 

## 2017-06-19 LAB — TSH: TSH: 1.19 (ref 0.41–5.90)

## 2017-06-20 LAB — HEMOGLOBIN A1C: HEMOGLOBIN A1C: 5.1

## 2017-06-22 LAB — CBC AND DIFFERENTIAL
HCT: 37 — AB (ref 41–53)
Hemoglobin: 12.5 — AB (ref 13.5–17.5)
Neutrophils Absolute: 7
Platelets: 166 (ref 150–399)
WBC: 8.7

## 2017-06-29 ENCOUNTER — Emergency Department (HOSPITAL_COMMUNITY)
Admission: EM | Admit: 2017-06-29 | Discharge: 2017-06-29 | Disposition: A | Discharge status: Home / Self Care | Payer: Medicare Other | Attending: Emergency Medicine | Admitting: Emergency Medicine

## 2017-06-29 ENCOUNTER — Emergency Department (HOSPITAL_COMMUNITY): Payer: Medicare Other

## 2017-06-29 ENCOUNTER — Encounter (HOSPITAL_COMMUNITY): Payer: Self-pay

## 2017-06-29 ENCOUNTER — Other Ambulatory Visit: Payer: Self-pay

## 2017-06-29 DIAGNOSIS — M546 Pain in thoracic spine: Secondary | ICD-10-CM | POA: Insufficient documentation

## 2017-06-29 DIAGNOSIS — Y998 Other external cause status: Secondary | ICD-10-CM | POA: Diagnosis not present

## 2017-06-29 DIAGNOSIS — Y9389 Activity, other specified: Secondary | ICD-10-CM | POA: Diagnosis not present

## 2017-06-29 DIAGNOSIS — M25551 Pain in right hip: Secondary | ICD-10-CM | POA: Diagnosis not present

## 2017-06-29 DIAGNOSIS — W06XXXA Fall from bed, initial encounter: Secondary | ICD-10-CM | POA: Diagnosis not present

## 2017-06-29 DIAGNOSIS — Y92129 Unspecified place in nursing home as the place of occurrence of the external cause: Secondary | ICD-10-CM | POA: Insufficient documentation

## 2017-06-29 DIAGNOSIS — S0990XA Unspecified injury of head, initial encounter: Secondary | ICD-10-CM | POA: Insufficient documentation

## 2017-06-29 MED ORDER — HYDROCODONE-ACETAMINOPHEN 5-325 MG PO TABS: 1.0000 | ORAL_TABLET | Freq: Once | ORAL | Status: DC

## 2017-06-29 NOTE — ED Notes (Signed)
[facility].  Facility was contacted but no answer x2.  Belongings sent with EMS to return with patient to facility.

## 2017-06-29 NOTE — ED Notes (Signed)
Attempted to [facility].  No answer x2
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
## 2017-06-29 NOTE — ED Notes (Signed)
Attempted to give patient medication.  Patient refused shaking his head no he did not want any medications.

## 2017-07-04 LAB — BASIC METABOLIC PANEL
BUN: 42 — AB (ref 4–21)
CREATININE: 0.7 (ref 0.6–1.3)
GLUCOSE: 113
Potassium: 4 (ref 3.4–5.3)
SODIUM: 139 (ref 137–147)

## 2017-07-04 LAB — CBC AND DIFFERENTIAL
HEMATOCRIT: 31 — AB (ref 41–53)
HEMOGLOBIN: 10.6 — AB (ref 13.5–17.5)
Neutrophils Absolute: 9
Platelets: 136 — AB (ref 150–399)
WBC: 12.1

## 2017-07-12 ENCOUNTER — Non-Acute Institutional Stay (SKILLED_NURSING_FACILITY): Payer: Medicare Other | Admitting: Adult Health

## 2017-07-12 ENCOUNTER — Encounter: Payer: Self-pay | Admitting: Adult Health

## 2017-07-12 DIAGNOSIS — R569 Unspecified convulsions: Secondary | ICD-10-CM | POA: Diagnosis not present

## 2017-07-12 DIAGNOSIS — H04123 Dry eye syndrome of bilateral lacrimal glands: Secondary | ICD-10-CM | POA: Diagnosis not present

## 2017-07-12 DIAGNOSIS — R1310 Dysphagia, unspecified: Secondary | ICD-10-CM | POA: Diagnosis not present

## 2017-07-12 DIAGNOSIS — E559 Vitamin D deficiency, unspecified: Secondary | ICD-10-CM | POA: Diagnosis not present

## 2017-07-12 DIAGNOSIS — Z960 Presence of urogenital implants: Secondary | ICD-10-CM

## 2017-07-12 DIAGNOSIS — G894 Chronic pain syndrome: Secondary | ICD-10-CM

## 2017-07-12 DIAGNOSIS — J309 Allergic rhinitis, unspecified: Secondary | ICD-10-CM | POA: Diagnosis not present

## 2017-07-12 DIAGNOSIS — F419 Anxiety disorder, unspecified: Secondary | ICD-10-CM

## 2017-07-12 DIAGNOSIS — N400 Enlarged prostate without lower urinary tract symptoms: Secondary | ICD-10-CM

## 2017-07-12 DIAGNOSIS — I10 Essential (primary) hypertension: Secondary | ICD-10-CM

## 2017-07-12 DIAGNOSIS — M109 Gout, unspecified: Secondary | ICD-10-CM | POA: Diagnosis not present

## 2017-07-12 DIAGNOSIS — R4701 Aphasia: Secondary | ICD-10-CM | POA: Diagnosis not present

## 2017-07-12 DIAGNOSIS — Z978 Presence of other specified devices: Secondary | ICD-10-CM

## 2017-07-12 DIAGNOSIS — G4700 Insomnia, unspecified: Secondary | ICD-10-CM | POA: Diagnosis not present

## 2017-07-12 DIAGNOSIS — K219 Gastro-esophageal reflux disease without esophagitis: Secondary | ICD-10-CM

## 2017-07-12 DIAGNOSIS — B370 Candidal stomatitis: Secondary | ICD-10-CM

## 2017-07-12 DIAGNOSIS — Z9289 Personal history of other medical treatment: Secondary | ICD-10-CM
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
## 2017-07-27 DIAGNOSIS — I10 Essential (primary) hypertension: Secondary | ICD-10-CM | POA: Insufficient documentation

## 2017-08-02 ENCOUNTER — Non-Acute Institutional Stay (SKILLED_NURSING_FACILITY): Payer: Medicare Other | Admitting: Internal Medicine

## 2017-08-02 ENCOUNTER — Encounter: Payer: Self-pay | Admitting: Internal Medicine

## 2017-08-02 DIAGNOSIS — B3749 Other urogenital candidiasis: Secondary | ICD-10-CM | POA: Diagnosis not present

## 2017-08-02 NOTE — Progress Notes (Signed)
    NURSING HOME LOCATION:  [facility] ROOM NUMBER:  301-B  CODE STATUS:  DNR/Hospice  PCP: Pecola LawlessHopper, Suhaylah Wampole F, MD [address] [place] [id number]  This is a nursing facility follow up for specific acute issue of penile abnormality noted by Hospice Nurse.  Interim medical record and care since [facility] was updated with review of diagnostic studies and change in clinical status since last visit were documented.  HPI: His genitourinary history includes urinary tract obstruction due to renal calculi, UTI associated with hematuria, and BPH.There is no PSA on record. He has developed a mild anemia. On 07/04/17 hemoglobin was 10.6 and hematocrit 31. This is in the context of mild thrombocytopenia with a platelet count 136,000.  Review of systems: severely compromised by nonverbal state Apparently no dysuria, hematuria, pyuria Not cognizant of any rash, pruritus, change in appearance of skin  Physical exam:  Pertinent or positive findings:the patient was soundly asleep initially.His mouth was agape. He is not snoring or exhibiting apnea. He was startled awake and exhibits a wide-eyed stare. Heart sounds are markedly distant. Breath sounds are also decreased.A gastrostomy tube is present. He has severe contractions of the hands. The lower extremities are in foam support apparati. Foley in place . Discomfort with movement of Foley. Urethra enlarged & lax w/o cellulitis or purulence.There is a faint papular dermatitis over the head of the penis without associated vesicles. Slight prominence of the ridge of the glands. There is also some maceration and suggestion of fungal deposit under the foreskin. The urine is yellow but appears to have significant sediment in the tube.  General appearance:  no acute distress, increased work of breathing is present. Heart:  Normal rate and regular rhythm. S1 and S2 normal without gallop, murmur, click, rub.  Lungs: without wheezes,  rhonchi, rales, rubs. Abdomen: Bowel sounds are normal. Abdomen is soft and nontender with no organomegaly, hernias, masses. 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

## 2017-08-03 ENCOUNTER — Encounter: Payer: Self-pay | Admitting: Internal Medicine

## 2017-08-03 NOTE — Patient Instructions (Signed)
See assessment and plan

## 2017-08-15 ENCOUNTER — Other Ambulatory Visit: Payer: Self-pay

## 2017-08-15 MED ORDER — FENTANYL 50 MCG/HR TD PT72: 50.0000 ug | MEDICATED_PATCH | TRANSDERMAL | 0 refills | Status: AC

## 2017-08-15 NOTE — Telephone Encounter (Signed)
Form faxed to [facility] [phone]  Drew diagonal line through form after faxing.  Attached fax confirmation and put in pharmacy tote [facility].  Nurse, Annice PihJackie, notified.

## 2017-08-20 ENCOUNTER — Encounter: Payer: Self-pay | Admitting: Adult Health

## 2017-08-20 ENCOUNTER — Non-Acute Institutional Stay (SKILLED_NURSING_FACILITY): Payer: Medicare Other | Admitting: Adult Health

## 2017-08-20 DIAGNOSIS — G629 Polyneuropathy, unspecified: Secondary | ICD-10-CM | POA: Diagnosis not present

## 2017-08-20 DIAGNOSIS — K219 Gastro-esophageal reflux disease without esophagitis: Secondary | ICD-10-CM

## 2017-08-20 DIAGNOSIS — F419 Anxiety disorder, unspecified: Secondary | ICD-10-CM | POA: Diagnosis not present

## 2017-08-20 DIAGNOSIS — R1310 Dysphagia, unspecified: Secondary | ICD-10-CM

## 2017-08-20 DIAGNOSIS — R569 Unspecified convulsions: Secondary | ICD-10-CM | POA: Diagnosis not present

## 2017-08-20 DIAGNOSIS — I10 Essential (primary) hypertension: Secondary | ICD-10-CM

## 2017-08-20 DIAGNOSIS — H04123 Dry eye syndrome of bilateral lacrimal glands: Secondary | ICD-10-CM

## 2017-08-20 DIAGNOSIS — J309 Allergic rhinitis, unspecified: Secondary | ICD-10-CM | POA: Diagnosis not present

## 2017-08-20 DIAGNOSIS — G4700 Insomnia, unspecified: Secondary | ICD-10-CM

## 2017-08-20 DIAGNOSIS — F320 Major depressive disorder, single episode, mild: Secondary | ICD-10-CM

## 2017-08-20 DIAGNOSIS — G894 Chronic pain syndrome: Secondary | ICD-10-CM

## 2017-08-20 DIAGNOSIS — M109 Gout, unspecified: Secondary | ICD-10-CM
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
## 2017-08-21 ENCOUNTER — Other Ambulatory Visit: Payer: Self-pay

## 2017-08-21 MED ORDER — MORPHINE SULFATE (CONCENTRATE) 20 MG/ML PO SOLN: 5.0000 mg | Freq: Four times a day (QID) | ORAL | 0 refills | Status: AC | PRN

## 2017-08-21 NOTE — Telephone Encounter (Signed)
Hard script written by NP.  [facility].  Drew a Customer service managerdiagonal line through original and placed [facility] pharmacy tote [address].  Nurse, Annice PihJackie, informed.

## 2017-08-31 DEATH — deceased
